# Patient Record
Sex: Male | Born: 1950 | Race: White | Hispanic: No | Marital: Married | Smoking: Former smoker
Health system: Southern US, Community
[De-identification: ages and names within clinical notes are randomized; demographics above are authoritative.]

## PROBLEM LIST (undated history)

## (undated) DIAGNOSIS — E785 Hyperlipidemia, unspecified: Secondary | ICD-10-CM

## (undated) DIAGNOSIS — J302 Other seasonal allergic rhinitis: Secondary | ICD-10-CM

## (undated) DIAGNOSIS — N200 Calculus of kidney: Secondary | ICD-10-CM

## (undated) DIAGNOSIS — Z8639 Personal history of other endocrine, nutritional and metabolic disease: Secondary | ICD-10-CM

## (undated) DIAGNOSIS — I10 Essential (primary) hypertension: Secondary | ICD-10-CM

## (undated) DIAGNOSIS — M48061 Spinal stenosis, lumbar region without neurogenic claudication: Secondary | ICD-10-CM

## (undated) DIAGNOSIS — N3289 Other specified disorders of bladder: Secondary | ICD-10-CM

## (undated) DIAGNOSIS — K5730 Diverticulosis of large intestine without perforation or abscess without bleeding: Secondary | ICD-10-CM

## (undated) DIAGNOSIS — R339 Retention of urine, unspecified: Secondary | ICD-10-CM

## (undated) DIAGNOSIS — Z8551 Personal history of malignant neoplasm of bladder: Secondary | ICD-10-CM

## (undated) DIAGNOSIS — R350 Frequency of micturition: Secondary | ICD-10-CM

## (undated) DIAGNOSIS — C679 Malignant neoplasm of bladder, unspecified: Secondary | ICD-10-CM

## (undated) DIAGNOSIS — R351 Nocturia: Secondary | ICD-10-CM

## (undated) DIAGNOSIS — M1990 Unspecified osteoarthritis, unspecified site: Secondary | ICD-10-CM

## (undated) DIAGNOSIS — Z87442 Personal history of urinary calculi: Secondary | ICD-10-CM

## (undated) DIAGNOSIS — Z8572 Personal history of non-Hodgkin lymphomas: Secondary | ICD-10-CM

## (undated) HISTORY — PX: NASAL SEPTUM SURGERY: SHX37

## (undated) HISTORY — PX: EXTRACORPOREAL SHOCK WAVE LITHOTRIPSY: SHX1557

## (undated) HISTORY — PX: TRANSURETHRAL RESECTION OF BLADDER TUMOR: SHX2575

## (undated) HISTORY — PX: BLADDER DIVERTICULECTOMY: SHX1235

---

## 1999-01-31 ENCOUNTER — Encounter (INDEPENDENT_AMBULATORY_CARE_PROVIDER_SITE_OTHER): Payer: Self-pay | Admitting: Specialist

## 1999-01-31 ENCOUNTER — Ambulatory Visit (HOSPITAL_COMMUNITY): Admission: RE | Admit: 1999-01-31 | Discharge: 1999-01-31 | Payer: Self-pay | Admitting: Gastroenterology

## 2003-05-13 ENCOUNTER — Ambulatory Visit: Admission: RE | Admit: 2003-05-13 | Discharge: 2003-05-13 | Payer: Self-pay | Admitting: Family Medicine

## 2004-11-01 ENCOUNTER — Ambulatory Visit: Payer: Self-pay | Admitting: Family Medicine

## 2005-03-01 ENCOUNTER — Ambulatory Visit: Payer: Self-pay | Admitting: Family Medicine

## 2005-04-18 ENCOUNTER — Ambulatory Visit: Payer: Self-pay | Admitting: Family Medicine

## 2006-01-08 ENCOUNTER — Ambulatory Visit: Payer: Self-pay | Admitting: Family Medicine

## 2006-01-31 ENCOUNTER — Ambulatory Visit: Payer: Self-pay | Admitting: Family Medicine

## 2006-05-07 ENCOUNTER — Ambulatory Visit: Payer: Self-pay | Admitting: Family Medicine

## 2006-06-29 ENCOUNTER — Emergency Department (HOSPITAL_COMMUNITY): Admission: EM | Admit: 2006-06-29 | Discharge: 2006-06-29 | Payer: Self-pay | Admitting: Emergency Medicine

## 2006-06-30 ENCOUNTER — Emergency Department (HOSPITAL_COMMUNITY): Admission: EM | Admit: 2006-06-30 | Discharge: 2006-06-30 | Payer: Self-pay | Admitting: Emergency Medicine

## 2006-07-19 ENCOUNTER — Ambulatory Visit (HOSPITAL_BASED_OUTPATIENT_CLINIC_OR_DEPARTMENT_OTHER): Admission: RE | Admit: 2006-07-19 | Discharge: 2006-07-19 | Payer: Self-pay | Admitting: Urology

## 2006-07-19 HISTORY — PX: OTHER SURGICAL HISTORY: SHX169

## 2006-09-18 ENCOUNTER — Ambulatory Visit: Payer: Self-pay | Admitting: Family Medicine

## 2006-11-29 ENCOUNTER — Ambulatory Visit (HOSPITAL_COMMUNITY): Admission: RE | Admit: 2006-11-29 | Discharge: 2006-11-29 | Payer: Self-pay | Admitting: Urology

## 2010-08-19 NOTE — Op Note (Signed)
NAME:  OMARRI, EICH                ACCOUNT NO.:  [id number]   MEDICAL RECORD NO.:  [id number]          PATIENT TYPE:  AMB   LOCATION:  [facility]                         FACILITY:  [facility]   PHYSICIAN:  Lindaann Slough, M.D.  DATE OF BIRTH:  1950/06/03   DATE OF PROCEDURE:  07/19/2006  DATE OF DISCHARGE:                               OPERATIVE REPORT   PREOPERATIVE DIAGNOSIS:  Right ureteral stone.   POSTOPERATIVE DIAGNOSIS:  Right ureteral stone.   PROCEDURE:  Cystoscopy, right retrograde pyelogram, ureteroscopy,  holmium laser right ureteral stone, and stone extraction.   SURGEON:  Danae Chen, M.D.   ANESTHESIA:  General.   INDICATION:  The patient is a 60 year old male who was seen in the  emergency room on March 28 with severe right flank pain.  CT scan showed  bilateral renal calculi and a 4 mm right UPJ calculus.  The patient has  been having pain on and off.  He has been on Flomax and he has not  passed a stone.  Repeat CT scan a week ago showed that the stone has  moved down to the mid ureter.  [place]  and he is concerned about having pain while he is away and he wanted to  have the stone removed.  Since he has not passed the stone, he is  scheduled today for stone manipulation.   DESCRIPTION OF PROCEDURE:  Under general anesthesia, the patient was  prepped and draped and placed in the dorsal lithotomy position.  A #22  Wappler cystoscope was inserted in the bladder.  The anterior urethra is  normal. He has moderate prostatic hypertrophy. The bladder is moderately  trabeculated.  There is no stone or tumor in the bladder.   Retrograde pyelogram:   A cone tip catheter was then passed through the cystoscope into the  right ureteral orifice.  Contrast was then injected through the cone tip  catheter.  The distal ureter appears normal.  I do not see a filling  defect in the ureter and the proximal ureter is moderately  dilated.  The cone tip  catheter was then removed.  A glidewire was then passed  through an open ended catheter and the open ended catheter was passed  through the cystoscope into the right  ureteral orifice and the  glidewire was advanced all the way up into the renal pelvis.  The open  ended catheter was then advanced over the glidewire into the mid ureter  and the glidewire was replaced with a guide-wire.   The intramural ureter was then dilated with the inner sheath of the  ureteroscope access sheath and the ureteroscope access sheath was  removed.  Then, a 6.5 French semi-rigid ureteroscope was passed in the  bladder and in the ureter without difficulty.  There is a stone in the  distal ureter.  With the 365 microfiber Holmium laser, the stone was  fragmented in multiple stone fragments.  Then, the stone fragments were  removed with a nitinol stone basket and dropped in the bladder.  The  ureteroscope  was then reinserted in the ureter and there was no evidence  of remaining stone fragments in the ureter.  The ureteroscope was then  removed.  The guidewire was then back loaded into the cystoscope and a 6  French 26 double J catheter was passed over the guidewire. The proximal  curl of the double-J  catheter is in the renal pelvis.  The distal curl is in the bladder.  The guidewire was then removed.  The stone fragments were irrigated out  of the bladder.  The double-J catheter was left with the string.   The patient tolerated the procedure well and left the OR in satisfactory  condition to the post anesthesia care unit.      Lindaann Slough, M.D.  Electronically Signed     MN/MEDQ  D:  07/19/2006  T:  07/19/2006  Job:  [id number]

## 2011-01-13 ENCOUNTER — Ambulatory Visit (HOSPITAL_BASED_OUTPATIENT_CLINIC_OR_DEPARTMENT_OTHER)
Admission: RE | Admit: 2011-01-13 | Discharge: 2011-01-13 | Disposition: A | Discharge status: Home / Self Care | Payer: PRIVATE HEALTH INSURANCE | Source: Ambulatory Visit | Attending: Urology | Admitting: Urology

## 2011-01-13 DIAGNOSIS — I10 Essential (primary) hypertension: Secondary | ICD-10-CM | POA: Insufficient documentation

## 2011-01-13 DIAGNOSIS — Z79899 Other long term (current) drug therapy: Secondary | ICD-10-CM | POA: Insufficient documentation

## 2011-01-13 DIAGNOSIS — D494 Neoplasm of unspecified behavior of bladder: Secondary | ICD-10-CM | POA: Insufficient documentation

## 2011-01-13 DIAGNOSIS — N323 Diverticulum of bladder: Secondary | ICD-10-CM | POA: Insufficient documentation

## 2011-01-13 DIAGNOSIS — N400 Enlarged prostate without lower urinary tract symptoms: Secondary | ICD-10-CM | POA: Insufficient documentation

## 2011-01-13 DIAGNOSIS — Z538 Procedure and treatment not carried out for other reasons: Secondary | ICD-10-CM | POA: Insufficient documentation

## 2011-01-13 HISTORY — PX: OTHER SURGICAL HISTORY: SHX169

## 2011-01-13 LAB — POCT I-STAT 4, (NA,K, GLUC, HGB,HCT)
HCT: 43 % (ref 39.0–52.0)
Hemoglobin: 14.6 g/dL (ref 13.0–17.0)

## 2011-01-26 NOTE — Op Note (Signed)
Justin Huber, Justin Huber                ACCOUNT NO.:  [id number]  MEDICAL RECORD NO.:  [id number]  LOCATION:                               FACILITY:  [facility]  PHYSICIAN:  Danae Chen, M.D.  DATE OF BIRTH:  1950-06-08  DATE OF PROCEDURE:  01/13/2011 DATE OF DISCHARGE:                              OPERATIVE REPORT   PREOPERATIVE DIAGNOSIS:  Recurrent bladder tumor.  POSTOP DIAGNOSES:  Recurrent bladder tumor.  PROCEDURE:  Cystoscopy attempted and TUR bladder tumor.  SURGEONS: 1. Danae Chen, M.D. 2. Sigmund I. Patsi Sears, M.D.  ANESTHESIA:  General.  INDICATION:  The patient is a 60 year old male who had a TUR of a bladder tumor in a diverticulum a year ago in [place].  Cystoscopy about 2 weeks ago showed a recurrent tumor in the diverticulum.  The patient has several diverticula on the left side of the bladder.  He is scheduled today for cystoscopy, TUR bladder tumor.  PROCEDURE IN DETAIL:  The patient was identified by his wristband and proper time-out was taken.  Under general anesthesia, he was prepped and draped and placed in the dorsal lithotomy position.  A panendoscope was inserted in the bladder. The anterior urethra was normal.  There was trilobar prostatic hypertrophy.  There were several diverticula on the left side of the bladder.  There was a papillary tumor in one of the diverticula.  The cystoscope was removed.  The urethra was dilated with a #30-French [employer] sounds.  Then, a #28 Iglesias resectoscope was inserted in the bladder.  It was difficult to access the diverticulum with the bladder tumor with the resectoscope.  I removed the resectoscope and passed a flexible cystoscope in the bladder and I was able to visualize the tumor in the diverticulum.  However, it was difficult to use a Bovie electrode to fulgurate the tumor with the flexible scope.  I then removed the flexible cystoscope and reinserted the resectoscope in the bladder, but again it was  difficult to enter the diverticulum with the resectoscope.  I removed the resectoscope and I called Dr. Patsi Sears and asked him for his assistance.  We reinserted a cystoscope with a  right angle lens in the bladder and was able to again access the diverticulum.  Several attempts were made to fulgurate the bladder tumor with the Bovie electrode through the rigid scope, but it was difficult because of the location of the bladder tumor at the 6 o'clock position in the diverticulum, it was difficult to fulgurate it.  After several attempts to fulgurate the bladder tumor in the diverticulum, it was then decided to end the procedure.  There was no evidence of bleeding.  The cystoscope was removed.  The patient tolerated the procedure well and left the OR in satisfactory condition to postanesthesia care unit.  PLAN:  Plan is to either treat the patient with intravesical BCG and repeat the cystoscopy in the office or to take the patient back to the OR and attempt again to fulgurate the bladder tumor and this will be discussed with him and his wife when he returns to the office for followup.     Danae Chen, M.D.  MN/MEDQ  D:  01/13/2011  T:  01/14/2011  Job:  [id number]  Electronically Signed by Lindaann Slough M.D. on 01/26/2011 03:43:33 PM

## 2012-10-19 ENCOUNTER — Emergency Department (HOSPITAL_COMMUNITY)
Admission: EM | Admit: 2012-10-19 | Discharge: 2012-10-19 | Disposition: A | Discharge status: Home / Self Care | Payer: 59 | Attending: Emergency Medicine | Admitting: Emergency Medicine

## 2012-10-19 ENCOUNTER — Emergency Department (HOSPITAL_COMMUNITY): Payer: 59

## 2012-10-19 ENCOUNTER — Encounter (HOSPITAL_COMMUNITY): Payer: Self-pay

## 2012-10-19 DIAGNOSIS — Z8571 Personal history of Hodgkin lymphoma: Secondary | ICD-10-CM | POA: Insufficient documentation

## 2012-10-19 DIAGNOSIS — C8590 Non-Hodgkin lymphoma, unspecified, unspecified site: Secondary | ICD-10-CM | POA: Insufficient documentation

## 2012-10-19 DIAGNOSIS — Z7982 Long term (current) use of aspirin: Secondary | ICD-10-CM | POA: Insufficient documentation

## 2012-10-19 DIAGNOSIS — N323 Diverticulum of bladder: Secondary | ICD-10-CM | POA: Insufficient documentation

## 2012-10-19 DIAGNOSIS — N200 Calculus of kidney: Secondary | ICD-10-CM | POA: Insufficient documentation

## 2012-10-19 DIAGNOSIS — Z79899 Other long term (current) drug therapy: Secondary | ICD-10-CM | POA: Insufficient documentation

## 2012-10-19 DIAGNOSIS — Z87891 Personal history of nicotine dependence: Secondary | ICD-10-CM | POA: Insufficient documentation

## 2012-10-19 DIAGNOSIS — Z9889 Other specified postprocedural states: Secondary | ICD-10-CM | POA: Insufficient documentation

## 2012-10-19 DIAGNOSIS — D303 Benign neoplasm of bladder: Secondary | ICD-10-CM | POA: Insufficient documentation

## 2012-10-19 DIAGNOSIS — Z87448 Personal history of other diseases of urinary system: Secondary | ICD-10-CM | POA: Insufficient documentation

## 2012-10-19 LAB — CBC WITH DIFFERENTIAL/PLATELET
Basophils Relative: 1 % (ref 0–1)
Hemoglobin: 14.8 g/dL (ref 13.0–17.0)
Lymphs Abs: 1.2 10*3/uL (ref 0.7–4.0)
MCHC: 34.6 g/dL (ref 30.0–36.0)
Monocytes Relative: 8 % (ref 3–12)
Neutro Abs: 6.3 10*3/uL (ref 1.7–7.7)
Neutrophils Relative %: 76 % (ref 43–77)
RBC: 4.98 MIL/uL (ref 4.22–5.81)

## 2012-10-19 LAB — BASIC METABOLIC PANEL
BUN: 15 mg/dL (ref 6–23)
Chloride: 102 mEq/L (ref 96–112)
GFR calc Af Amer: >90 mL/min (ref >90)
Potassium: 3.7 mEq/L (ref 3.5–5.1)

## 2012-10-19 MED ORDER — ONDANSETRON HCL 4 MG/2ML IJ SOLN
4.0000 mg | Freq: Once | INTRAMUSCULAR | Status: AC
  Administered 2012-10-19: 4 mg via INTRAVENOUS
  Filled 2012-10-19: qty 2

## 2012-10-19 MED ORDER — ONDANSETRON 4 MG PO TBDP: ORAL_TABLET | ORAL | Status: DC

## 2012-10-19 MED ORDER — KETOROLAC TROMETHAMINE 30 MG/ML IJ SOLN
30.0000 mg | Freq: Once | INTRAMUSCULAR | Status: AC
  Administered 2012-10-19: 30 mg via INTRAVENOUS
  Filled 2012-10-19: qty 1

## 2012-10-19 MED ORDER — OXYCODONE-ACETAMINOPHEN 5-325 MG PO TABS: 1.0000 | ORAL_TABLET | Freq: Four times a day (QID) | ORAL | Status: DC | PRN

## 2012-10-19 MED ORDER — TAMSULOSIN HCL 0.4 MG PO CAPS: 0.4000 mg | ORAL_CAPSULE | Freq: Every day | ORAL | Status: DC

## 2012-10-19 MED ORDER — HYDROMORPHONE HCL PF 1 MG/ML IJ SOLN
1.0000 mg | Freq: Once | INTRAMUSCULAR | Status: AC
  Administered 2012-10-19: 1 mg via INTRAVENOUS
  Filled 2012-10-19: qty 1

## 2012-10-19 NOTE — ED Provider Notes (Signed)
History    CSN: [id number] Arrival date & time 10/19/12  4098  First MD Initiated Contact with Patient 10/19/12 [phone]     Chief Complaint  Patient presents with  . Flank Pain   (Consider location/radiation/quality/duration/timing/severity/associated sxs/prior Treatment) Patient is a 62 y.o. male presenting with flank pain. The history is provided by the patient (pt complains of left flank pain). No language interpreter was used.  Flank Pain This is a new problem. The current episode started 6 to 12 hours ago. The problem occurs constantly. The problem has not changed since onset.Pertinent negatives include no chest pain, no abdominal pain and no headaches. Nothing aggravates the symptoms. Nothing relieves the symptoms.   Past Medical History  Diagnosis Date  . Benign bladder tumor   . Bladder diverticulum   . Kidney stone   . Cancer   . Non Hodgkin's lymphoma    Past Surgical History  Procedure Laterality Date  . Lithotripsy    . Cystoscopy/retrograde/ureteroscopy/stone extraction with basket     History reviewed. No pertinent family history. History  Substance Use Topics  . Smoking status: Never Smoker   . Smokeless tobacco: Not on file  . Alcohol Use: Yes    Review of Systems  Constitutional: Negative for appetite change and fatigue.  HENT: Negative for congestion, sinus pressure and ear discharge.   Eyes: Negative for discharge.  Respiratory: Negative for cough.   Cardiovascular: Negative for chest pain.  Gastrointestinal: Negative for abdominal pain and diarrhea.  Genitourinary: Positive for flank pain. Negative for frequency and hematuria.  Musculoskeletal: Negative for back pain.  Skin: Negative for rash.  Neurological: Negative for seizures and headaches.  Psychiatric/Behavioral: Negative for hallucinations.    Allergies  Review of patient's allergies indicates no known allergies.  Home Medications   Current Outpatient Rx  Name  Route  Sig  Dispense   Refill  . aspirin EC 81 MG tablet   Oral   Take 81 mg by mouth every evening.         Marland Kitchen atorvastatin (LIPITOR) 10 MG tablet   Oral   Take 10 mg by mouth every evening.         . hydrochlorothiazide (HYDRODIURIL) 25 MG tablet   Oral   Take 12.5 mg by mouth every morning.         . irbesartan (AVAPRO) 300 MG tablet   Oral   Take 300 mg by mouth every morning.         . ondansetron (ZOFRAN ODT) 4 MG disintegrating tablet      4mg  ODT q6 hours prn nausea/vomit   12 tablet   0   . oxyCODONE-acetaminophen (PERCOCET/ROXICET) 5-325 MG per tablet   Oral   Take 1 tablet by mouth every 6 (six) hours as needed for pain.   20 tablet   0   . tamsulosin (FLOMAX) 0.4 MG CAPS   Oral   Take 1 capsule (0.4 mg total) by mouth daily.   5 capsule   0    BP 152/73  Pulse 66  Temp(Src) 97.9 F (36.6 C) (Oral)  Resp 20  SpO2 96% Physical Exam  Constitutional: He is oriented to person, place, and time. He appears well-developed.  HENT:  Head: Normocephalic.  Eyes: Conjunctivae and EOM are normal. No scleral icterus.  Neck: Neck supple. No thyromegaly present.  Cardiovascular: Normal rate and regular rhythm.  Exam reveals no gallop and no friction rub.   No murmur heard. Pulmonary/Chest: No stridor. He has  no wheezes. He has no rales. He exhibits no tenderness.  Abdominal: He exhibits no distension. There is no tenderness. There is no rebound.  Genitourinary:  Tender left flank  Musculoskeletal: Normal range of motion. He exhibits no edema.  Lymphadenopathy:    He has no cervical adenopathy.  Neurological: He is oriented to person, place, and time. Coordination normal.  Skin: No rash noted. No erythema.  Psychiatric: He has a normal mood and affect. His behavior is normal.    ED Course  Procedures (including critical care time) Labs Reviewed  BASIC METABOLIC PANEL - Abnormal; Notable for the following:    Glucose, Bld 136 (*)    GFR calc non Af Amer 85 (*)    All  other components within normal limits  CBC WITH DIFFERENTIAL   Ct Abdomen Pelvis Wo Contrast  10/19/2012   *RADIOLOGY REPORT*  Clinical Data: Abdominal pain, dysuria and nausea  CT ABDOMEN AND PELVIS WITHOUT CONTRAST  Technique:  Multidetector CT imaging of the abdomen and pelvis was performed following the standard protocol without intravenous contrast.  Comparison: Most recent CT abdomen/pelvis 01/09/2011  Findings:  Lower Chest:  Lung bases are clear.  Visualized cardiac structures within normal limits for size.  No pericardial effusion. Unremarkable distal thoracic esophagus.  Abdomen: Unenhanced CT was performed per clinician order.  Lack of IV contrast limits sensitivity and specificity, especially for evaluation of abdominal/pelvic solid viscera.  Within these limitations, unremarkable CT appearance of the stomach and duodenum (incidental note made of a periampullary duodenal diverticulum) adrenal glands and pancreas.  Punctate calcifications throughout the spleen consistent with old granulomatous disease and unchanged compared to prior.  Normal hepatic morphology and contours.  No focal hepatic lesion. Gallbladder is unremarkable. No intra or extrahepatic biliary ductal dilatation.  Mild left hydronephrosis, renal edema and asymmetric left greater than right perinephric edema secondary to a 3 x 5 mm stone in the proximal ureter located at the level of the inferior endplate of L2. Additional bilateral nonobstructing nephrolithiasis with at least five stones on the right and an additional two stones on the left.  The largest right-sided stone measures 4 mm.  The largest intra renal left stone measures 4 mm as well. Sub centimeter non the fluid attenuation lesions exophytic from the posterior interpolar and lower pole regions of the left kidney are incompletely evaluated the absence of intravenous contrast material.  The lesions appear slightly enlarged compared to the prior study.  Normal-caliber large and  small bowel throughout the abdomen.  No evidence of a bowel obstruction or focal bowel wall thickening.  Scattered sigmoid diverticulosis without active inflammation.  No free fluid or suspicious adenopathy.  Pelvis: Stable appearance of bilateral bladder diverticula and surgical changes in the bilateral pelvic side walls.  No free fluid or suspicious adenopathy.  Coarse calcifications noted in the prostate gland.  Small bilateral fat containing inguinal hernias.  Bones: No acute fracture or aggressive appearing lytic or blastic osseous lesion. Lower lumbar facet arthropathy.  Vascular: Limited evaluation in the absence of intravenous contrast.  Scattered atherosclerotic vascular calcifications without aneurysmal dilatation.  IMPRESSION:  1.  Obstructing 3 x 5 mm proximal left ureteral stone with resultant mild - moderate left hydronephrosis, renal edema and perinephric stranding.  2. Multiple additional nonobstructing renal calculi bilaterally.  3.  Sub centimeter exophytic lesions from the left kidney are incompletely evaluated in the absence of intravenous contrast material, but appears slightly enlarged compared to the prior study.  These may represent complex (hemorrhagic)  cysts, or potentially small renal neoplasms.  Recommend further evaluation with MRI of the abdomen with and without contrast in 3- 6 months.  4.  Stable appearance of bilateral bladder diverticula.  5.  Atherosclerosis.  6.  Sigmoid colonic diverticulosis without active diverticulitis.  7.  Small bilateral fat containing inguinal hernias.   Original Report Authenticated By: Malachy Moan, M.D.   1. Kidney stone     MDM    Benny Lennert, MD 10/19/12 [phone]

## 2012-10-19 NOTE — ED Notes (Signed)
Urinal at bedside.  

## 2012-10-19 NOTE — ED Notes (Signed)
He c/o left flank pain which began early this morning.

## 2012-10-21 ENCOUNTER — Other Ambulatory Visit: Payer: Self-pay | Admitting: Urology

## 2012-10-22 ENCOUNTER — Encounter (HOSPITAL_COMMUNITY): Payer: Self-pay | Admitting: *Deleted

## 2012-10-24 ENCOUNTER — Ambulatory Visit (HOSPITAL_COMMUNITY): Payer: 59

## 2012-10-24 ENCOUNTER — Encounter (HOSPITAL_COMMUNITY)
Admission: RE | Disposition: A | Discharge status: Home / Self Care | Payer: Self-pay | Source: Ambulatory Visit | Attending: Urology

## 2012-10-24 ENCOUNTER — Ambulatory Visit (HOSPITAL_COMMUNITY)
Admission: RE | Admit: 2012-10-24 | Discharge: 2012-10-24 | Disposition: A | Discharge status: Home / Self Care | Payer: 59 | Source: Ambulatory Visit | Attending: Urology | Admitting: Urology

## 2012-10-24 ENCOUNTER — Encounter (HOSPITAL_COMMUNITY): Payer: Self-pay | Admitting: *Deleted

## 2012-10-24 DIAGNOSIS — Z7982 Long term (current) use of aspirin: Secondary | ICD-10-CM | POA: Insufficient documentation

## 2012-10-24 DIAGNOSIS — Z79899 Other long term (current) drug therapy: Secondary | ICD-10-CM | POA: Insufficient documentation

## 2012-10-24 DIAGNOSIS — N201 Calculus of ureter: Secondary | ICD-10-CM | POA: Insufficient documentation

## 2012-10-24 DIAGNOSIS — E7800 Pure hypercholesterolemia, unspecified: Secondary | ICD-10-CM | POA: Insufficient documentation

## 2012-10-24 DIAGNOSIS — I10 Essential (primary) hypertension: Secondary | ICD-10-CM | POA: Insufficient documentation

## 2012-10-24 DIAGNOSIS — N1330 Unspecified hydronephrosis: Secondary | ICD-10-CM | POA: Insufficient documentation

## 2012-10-24 HISTORY — DX: Essential (primary) hypertension: I10

## 2012-10-24 SURGERY — LITHOTRIPSY, ESWL
Anesthesia: LOCAL | Laterality: Left

## 2012-10-24 MED ORDER — CIPROFLOXACIN HCL 500 MG PO TABS
500.0000 mg | ORAL_TABLET | ORAL | Status: AC
  Administered 2012-10-24: 500 mg via ORAL
  Filled 2012-10-24: qty 1

## 2012-10-24 MED ORDER — DIAZEPAM 5 MG PO TABS
10.0000 mg | ORAL_TABLET | ORAL | Status: AC
  Administered 2012-10-24: 10 mg via ORAL
  Filled 2012-10-24: qty 2

## 2012-10-24 MED ORDER — DIPHENHYDRAMINE HCL 25 MG PO CAPS
25.0000 mg | ORAL_CAPSULE | ORAL | Status: AC
  Administered 2012-10-24: 25 mg via ORAL
  Filled 2012-10-24: qty 1

## 2012-10-24 MED ORDER — DEXTROSE-NACL 5-0.45 % IV SOLN
INTRAVENOUS | Status: DC
  Administered 2012-10-24: 12:00:00 via INTRAVENOUS

## 2012-10-24 NOTE — H&P (Signed)
History of Present Illness  Mr Justin Huber was seen in the ER on 7/19 for sudden onset of severe left flank pain associated with nausea and vomiting.  He has a past history of kidney stone.  CT scan showed about 4-5 stones in the right kidney, two in the left.  The largest one in each side measures 4 mm.  There is also a 5 mm proximal left ureteral calculus with mild to moderate hydronephrosis.  Bladder diverticula are also present.  He was discharged home on Flomax.  He has been having mild left flank pain on and off.  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  He follows-up at [facility] on a regular schedule.   Past Medical History Problems  1. History of  Cancer 199.1 2. History of  Hypercholesterolemia 272.0 3. History of  Hypertension 401.9 4. History of  Nephrolithiasis V13.01  Surgical History Problems  1. History of  Cystoscopy (Diagnostic) 2. History of  Cystoscopy Bladder Tumor 596.9 3. History of  Cystoscopy With Ureteroscopy 4. History of  Neck Surgery 5. History of  Repair Of Forearm 6. History of  Rhinoplasty  Current Meds 1. Aspirin 81 MG Oral Tablet; Therapy: (Recorded:31Oct2011) to 2. Avapro 300 MG Oral Tablet; Therapy: (Recorded:31Oct2011) to 3. Hydrochlorothiazide CAPS; Therapy: (Recorded:31Oct2011) to 4. Lipitor 10 MG Oral Tablet; Therapy: (Recorded:31Oct2011) to 5.1  6.1  7. Tamsulosin HCl 0.4 MG Oral Capsule; Therapy: 19Jul2014 to  1. Amended By: Kalani Baray; 10/21/2012 6:42 PMEST   Allergies Medication  1. No Known Drug Allergies  Family History Problems  1. Family history of  Family Health Status - Mother's Age 86 2. Family history of  Family Health Status Number Of Children 2 SONS 3. Family history of  Nephrolithiasis  Social History Problems  1. Alcohol Use 2-3 per wk 2. Caffeine Use 3 PER DAY 3. Family history of  Death In The Family Father AGE 81 - [place] 4. Marital History - Currently Married 5.  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 Tobacco Use V15.82 LESS THAN 1 PACK PER DAYSMOKED FOR 15 YEARSQUIT 15 YEARS AGO  Review of Systems Genitourinary, constitutional, skin, eye, otolaryngeal, hematologic/lymphatic, cardiovascular, pulmonary, endocrine, musculoskeletal, gastrointestinal, neurological and psychiatric system(s) were reviewed and pertinent findings if present are noted.  Genitourinary: nocturia and weak urinary stream.    Vitals Vital Signs [Data Includes: Last 1 Day]  21Jul2014 11:38AM  Blood Pressure: 171 / 84 Heart Rate: 70 Respiration: 18  Physical Exam Constitutional: Well nourished and well developed . No acute distress.  ENT:. The ears and nose are normal in appearance.  Neck: The appearance of the neck is normal and no neck mass is present.  Pulmonary: No respiratory distress and normal respiratory rhythm and effort.  Cardiovascular: Heart rate and rhythm are normal . No peripheral edema.  Abdomen: The abdomen is soft and nontender. No masses are palpated. No CVA tenderness. No hernias are palpable. No hepatosplenomegaly noted.  Rectal: Rectal exam demonstrates normal sphincter tone, no tenderness and no masses. Estimated prostate size is 2+. The prostate has no nodularity, is not indurated and is not tender. The left seminal vesicle is nonpalpable. The right seminal vesicle is nonpalpable. The perineum is normal on inspection.  Genitourinary: Examination of the penis demonstrates no discharge, no masses, no lesions and a normal meatus. The scrotum is without lesions. The right epididymis is palpably normal and non-tender. The left epididymis is palpably normal and non-tender. The right testis is non-tender and   without masses. The left testis is non-tender and without masses.  Lymphatics: The femoral and inguinal nodes are not enlarged or tender.  Skin: Normal skin turgor, no visible rash and no visible skin lesions.  Neuro/Psych:. Mood and affect are appropriate.     Results/Data Urine [Data Includes: Last 1 Day]   21Jul2014  COLOR YELLOW   APPEARANCE CLEAR   SPECIFIC GRAVITY 1.010   pH 6.0   GLUCOSE NEG mg/dL  BILIRUBIN NEG   KETONE NEG mg/dL  BLOOD LARGE   PROTEIN NEG mg/dL  UROBILINOGEN 0.2 mg/dL  NITRITE NEG   LEUKOCYTE ESTERASE NEG   SQUAMOUS EPITHELIAL/HPF NONE SEEN   WBC NONE SEEN WBC/hpf  RBC 11-20 RBC/hpf  BACTERIA NONE SEEN   CRYSTALS NONE SEEN   CASTS NONE SEEN     I independently reviewed the CT scan and the findings are as noted above.   Plan Health Maintenance (V70.0)  1. UA With REFLEX  Done: 21Jul2014 11:15AM 1  Proximal Ureteral Stone On The Left (592.1)  2. Oxycodone-Acetaminophen 5-325 MG Oral Tablet; TAKE 1 TO 2 TABLETS EVERY 4 HOURS AS  NEEDED FOR PAIN; Therapy: 21Jul2014 to (Last Rx:21Jul2014) 1  3. Tamsulosin HCl 0.4 MG Oral Capsule; TAKE 1 CAPSULE Bedtime; Therapy: 21Jul2014 to (Last  Rx:21Jul2014)  Requested for: 21Jul2014; Edited 1  4. Follow-up Schedule Surgery Office  Follow-up  Done: 21Jul2014 1   1. Amended By: Chivon Lepage; 10/21/2012 6:42 PMEST    Continue Flomax.  Tretament options were reviewed with the patient: ESL versus ureteroscopy.  I told him that ESL is the least invasive of those procedures.  He had ESL in the past and wishes to proceed.  The risks of the procedure include but are not limited to hemorrhage, infection, injury to adjacent organs, steinstrasse, inability to fragment stone.  He understands and is agreeable. He will also need metabolic stone evaluation.   

## 2012-10-24 NOTE — Op Note (Signed)
Refer to [facility] Op Note scanned in the chart 

## 2013-11-02 ENCOUNTER — Emergency Department (HOSPITAL_COMMUNITY)
Admission: EM | Admit: 2013-11-02 | Discharge: 2013-11-02 | Disposition: A | Discharge status: Home / Self Care | Payer: Medicare HMO | Attending: Emergency Medicine | Admitting: Emergency Medicine

## 2013-11-02 ENCOUNTER — Emergency Department (HOSPITAL_COMMUNITY): Payer: Medicare HMO

## 2013-11-02 ENCOUNTER — Encounter (HOSPITAL_COMMUNITY): Payer: Self-pay | Admitting: Emergency Medicine

## 2013-11-02 DIAGNOSIS — Z7982 Long term (current) use of aspirin: Secondary | ICD-10-CM | POA: Diagnosis not present

## 2013-11-02 DIAGNOSIS — E876 Hypokalemia: Secondary | ICD-10-CM | POA: Diagnosis not present

## 2013-11-02 DIAGNOSIS — Z87898 Personal history of other specified conditions: Secondary | ICD-10-CM | POA: Insufficient documentation

## 2013-11-02 DIAGNOSIS — R300 Dysuria: Secondary | ICD-10-CM | POA: Diagnosis not present

## 2013-11-02 DIAGNOSIS — R1909 Other intra-abdominal and pelvic swelling, mass and lump: Secondary | ICD-10-CM | POA: Insufficient documentation

## 2013-11-02 DIAGNOSIS — Z87442 Personal history of urinary calculi: Secondary | ICD-10-CM | POA: Insufficient documentation

## 2013-11-02 DIAGNOSIS — R338 Other retention of urine: Secondary | ICD-10-CM | POA: Diagnosis not present

## 2013-11-02 DIAGNOSIS — R000 Tachycardia, unspecified: Secondary | ICD-10-CM | POA: Diagnosis not present

## 2013-11-02 DIAGNOSIS — Z87891 Personal history of nicotine dependence: Secondary | ICD-10-CM | POA: Diagnosis not present

## 2013-11-02 DIAGNOSIS — I10 Essential (primary) hypertension: Secondary | ICD-10-CM | POA: Diagnosis not present

## 2013-11-02 DIAGNOSIS — N201 Calculus of ureter: Secondary | ICD-10-CM | POA: Diagnosis not present

## 2013-11-02 DIAGNOSIS — R1900 Intra-abdominal and pelvic swelling, mass and lump, unspecified site: Secondary | ICD-10-CM

## 2013-11-02 LAB — URINALYSIS, ROUTINE W REFLEX MICROSCOPIC
BILIRUBIN URINE: NEGATIVE
Glucose, UA: NEGATIVE mg/dL
Ketones, ur: NEGATIVE mg/dL
Leukocytes, UA: NEGATIVE
NITRITE: NEGATIVE
PH: 5.5 (ref 5.0–8.0)
Protein, ur: NEGATIVE mg/dL
SPECIFIC GRAVITY, URINE: 1.017 (ref 1.005–1.030)
UROBILINOGEN UA: 0.2 mg/dL (ref 0.0–1.0)

## 2013-11-02 LAB — BASIC METABOLIC PANEL
ANION GAP: 15 (ref 5–15)
BUN: 12 mg/dL (ref 6–23)
CALCIUM: 10.9 mg/dL — AB (ref 8.4–10.5)
CHLORIDE: 97 meq/L (ref 96–112)
CO2: 24 mEq/L (ref 19–32)
CREATININE: 0.72 mg/dL (ref 0.50–1.35)
Glucose, Bld: 137 mg/dL — ABNORMAL HIGH (ref 70–99)
Potassium: 2.8 mEq/L — CL (ref 3.7–5.3)
Sodium: 136 mEq/L — ABNORMAL LOW (ref 137–147)

## 2013-11-02 LAB — CBC WITH DIFFERENTIAL/PLATELET
BASOS ABS: 0 10*3/uL (ref 0.0–0.1)
BASOS PCT: 0 % (ref 0–1)
EOS ABS: 0 10*3/uL (ref 0.0–0.7)
EOS PCT: 0 % (ref 0–5)
HEMATOCRIT: 39.1 % (ref 39.0–52.0)
HEMOGLOBIN: 13.5 g/dL (ref 13.0–17.0)
Lymphocytes Relative: 2 % — ABNORMAL LOW (ref 12–46)
Lymphs Abs: 0.1 10*3/uL — ABNORMAL LOW (ref 0.7–4.0)
MCH: 29.5 pg (ref 26.0–34.0)
MCHC: 34.5 g/dL (ref 30.0–36.0)
MCV: 85.6 fL (ref 78.0–100.0)
MONO ABS: 0.1 10*3/uL (ref 0.1–1.0)
MONOS PCT: 1 % — AB (ref 3–12)
NEUTROS ABS: 6.2 10*3/uL (ref 1.7–7.7)
Neutrophils Relative %: 97 % — ABNORMAL HIGH (ref 43–77)
Platelets: 197 10*3/uL (ref 150–400)
RBC: 4.57 MIL/uL (ref 4.22–5.81)
RDW: 11.7 % (ref 11.5–15.5)
WBC: 6.4 10*3/uL (ref 4.0–10.5)

## 2013-11-02 LAB — URINE MICROSCOPIC-ADD ON

## 2013-11-02 LAB — I-STAT TROPONIN, ED: TROPONIN I, POC: 0.03 ng/mL (ref 0.00–0.08)

## 2013-11-02 LAB — I-STAT CG4 LACTIC ACID, ED: Lactic Acid, Venous: 1.74 mmol/L (ref 0.5–2.2)

## 2013-11-02 MED ORDER — SODIUM CHLORIDE 0.9 % IV BOLUS (SEPSIS)
1000.0000 mL | Freq: Once | INTRAVENOUS | Status: AC
  Administered 2013-11-02: 1000 mL via INTRAVENOUS

## 2013-11-02 MED ORDER — SODIUM CHLORIDE 0.9 % IV SOLN
1000.0000 mL | Freq: Once | INTRAVENOUS | Status: AC
  Administered 2013-11-02: 1000 mL via INTRAVENOUS

## 2013-11-02 MED ORDER — CEPHALEXIN 500 MG PO CAPS: 500.0000 mg | ORAL_CAPSULE | Freq: Three times a day (TID) | ORAL | Status: DC

## 2013-11-02 MED ORDER — LIDOCAINE HCL 2 % EX GEL
CUTANEOUS | Status: AC
  Filled 2013-11-02: qty 10

## 2013-11-02 MED ORDER — POTASSIUM CHLORIDE CRYS ER 20 MEQ PO TBCR
40.0000 meq | EXTENDED_RELEASE_TABLET | Freq: Once | ORAL | Status: AC
  Administered 2013-11-02: 40 meq via ORAL
  Filled 2013-11-02: qty 2

## 2013-11-02 MED ORDER — POTASSIUM CHLORIDE 10 MEQ/100ML IV SOLN
10.0000 meq | Freq: Once | INTRAVENOUS | Status: AC
Start: 2013-11-02 — End: 2013-11-02
  Administered 2013-11-02: 10 meq via INTRAVENOUS
  Filled 2013-11-02: qty 100

## 2013-11-02 MED ORDER — ACETAMINOPHEN 325 MG PO TABS
650.0000 mg | ORAL_TABLET | Freq: Once | ORAL | Status: AC
  Administered 2013-11-02: 650 mg via ORAL
  Filled 2013-11-02: qty 2

## 2013-11-02 MED ORDER — LIDOCAINE HCL 2 % EX GEL
1.0000 "application " | Freq: Once | CUTANEOUS | Status: AC
  Administered 2013-11-02: 1 via URETHRAL

## 2013-11-02 NOTE — ED Notes (Signed)
Bladder Scan = 522mL PA notified.

## 2013-11-02 NOTE — ED Notes (Addendum)
Patient states he thought he had a UTI, seen at Urgent Care for evaluation, was placed on Cipro prophylactic as symptoms were fitting to UTI. Patient states he was called later and told that he did not have UTI but was told to complete abx and see urology on Monday. Patient states he began having fever/chills over night and is now having difficulty voiding during the night having urgency but unable to void.  Temp at home 101 orally, patient took 1 aleve at 0430. Patient reports last episode of voiding prior to bed last night. Patient also c/o burning sensation with voiding. Patient reports hx of kidney stones.

## 2013-11-02 NOTE — ED Notes (Signed)
Initial Contact - pt A+Ox4, resting on stretcher with family at bedside, pt denies complaints "other than shaking" at this time.  EDNP at bedside currently.  Pt is tremulous, afebrile.  Skin PWD.  MAEI, self repositioning for comfort.  Foley draining pink-tinged urine.  Abd s/nt/nd.  Speaking full/clear sentences.  Awaiting radiology.  NAD.

## 2013-11-02 NOTE — ED Notes (Signed)
MD at bedside. 

## 2013-11-02 NOTE — ED Notes (Signed)
Lab phoned K+ of 2.8 which I relayed to our PA, Elizabeth at this time.

## 2013-11-02 NOTE — ED Provider Notes (Signed)
CSN: [id number]     Arrival date & time 11/02/13  0606 History   First MD Initiated Contact with Patient 11/02/13 [phone]     Chief Complaint  Patient presents with  . Urinary Retention  . Dysuria   HPI Pt is 63 yo white male who presents with abd pressure and mild burning on urination.  His symptoms began 5 days ago.  Initially, he thought he was getting a UTI, which he tends to get due to history of renal calculi, benign bladder tumors and bladder diverticula and is followed by a urologist (Dr. Janice Norrie).  5 days ago, he went to an urgent care but because he had taken OTC pyridium, they were unable to analyze his urine.  A culture was sent at that time and he was empirically treated with Cipro.  Since that time he has not had improvement with the pressure or burning.  Early this morning he woke up with sweating, chills, shaking and temperature of 101 at home.  He was also unable to void since yesterday.  Currently, he rates the  pain/pressure as 5/10.  He denies urgency, hematuria, or testicular or penile pain.  He denies any flank pain or pain similar to previous kidney stones.  He reports some loose stools x 2 weeks but denies bloody or dark stools or any abd cramping related bowel movements.  Past Medical History  Diagnosis Date  . Benign bladder tumor   . Bladder diverticulum   . Kidney stone   . Hypertension   . Cancer   . Non Hodgkin's lymphoma 1980   Past Surgical History  Procedure Laterality Date  . Lithotripsy    . Cystoscopy/retrograde/ureteroscopy/stone extraction with basket    . Bladder surgery  2011    small bladder tumor removed  . Nasal septum surgery     No family history on file. History  Substance Use Topics  . Smoking status: Former Smoker -- 1.00 packs/day for 13 years    Quit date: 10/22/1993  . Smokeless tobacco: Not on file  . Alcohol Use: Yes     Comment: wine occ    Review of Systems  Constitutional: Positive for fever and chills.  HENT: Negative for ear  pain and sore throat.   Respiratory: Negative for cough and shortness of breath.   Cardiovascular: Negative for chest pain.  Gastrointestinal: Positive for diarrhea. Negative for nausea, vomiting, constipation and rectal pain.  Genitourinary: Positive for dysuria and difficulty urinating. Negative for urgency, penile pain and testicular pain.  Neurological: Negative for dizziness and weakness.  All other systems reviewed and are negative.   Allergies  Review of patient's allergies indicates no known allergies.  Home Medications   Prior to Admission medications   Medication Sig Start Date End Date Taking? Authorizing Provider  aspirin EC 81 MG tablet Take 81 mg by mouth every evening.   Yes Historical Provider, MD  atorvastatin (LIPITOR) 10 MG tablet Take 10 mg by mouth every evening.   Yes Historical Provider, MD  hydrochlorothiazide (HYDRODIURIL) 25 MG tablet Take 12.5 mg by mouth every morning.   Yes Historical Provider, MD  irbesartan (AVAPRO) 300 MG tablet Take 300 mg by mouth every morning.   Yes Historical Provider, MD  ondansetron (ZOFRAN-ODT) 4 MG disintegrating tablet Take 4 mg by mouth every 8 (eight) hours as needed for nausea or vomiting.   Yes Historical Provider, MD  oxyCODONE-acetaminophen (PERCOCET/ROXICET) 5-325 MG per tablet Take 1 tablet by mouth every 6 (six) hours as needed  for pain. 10/19/12  Yes Maudry Diego, MD  tamsulosin (FLOMAX) 0.4 MG CAPS Take 1 capsule (0.4 mg total) by mouth daily. 10/19/12  Yes Maudry Diego, MD   BP 167/74  Pulse 123  Temp(Src) 100 F (37.8 C) (Oral)  Resp 18  Ht 6\' 1"  (1.854 m)  Wt 218 lb (98.884 kg)  BMI 28.77 kg/m2  SpO2 94% Physical Exam  Nursing note and vitals reviewed. Constitutional: He is oriented to person, place, and time. He appears well-developed and well-nourished. No distress.  HENT:  Head: Normocephalic and atraumatic.  Eyes: Conjunctivae and EOM are normal. Pupils are equal, round, and reactive to light. No  scleral icterus.  Neck: Normal range of motion. Neck supple.  Cardiovascular: Regular rhythm and normal heart sounds.  Tachycardia present.   Pulmonary/Chest: Effort normal and breath sounds normal. No respiratory distress. He has no wheezes. He has no rales. He exhibits no tenderness.  Abdominal: Soft. Normal appearance and bowel sounds are normal. He exhibits no distension. There is no tenderness. There is no CVA tenderness.    Genitourinary: Penis normal.  Musculoskeletal: Normal range of motion.  Neurological: He is alert and oriented to person, place, and time. He has normal strength. GCS eye subscore is 4. GCS verbal subscore is 5. GCS motor subscore is 6.  Skin: Skin is warm, dry and intact.  Psychiatric: He has a normal mood and affect. His behavior is normal. Judgment and thought content normal.    ED Course  Procedures (including critical care time) Labs Review Labs Reviewed  URINALYSIS, ROUTINE W REFLEX MICROSCOPIC - Abnormal; Notable for the following:    Hgb urine dipstick TRACE (*)    All other components within normal limits  CBC WITH DIFFERENTIAL - Abnormal; Notable for the following:    Neutrophils Relative % 97 (*)    Lymphocytes Relative 2 (*)    Lymphs Abs 0.1 (*)    Monocytes Relative 1 (*)    All other components within normal limits  BASIC METABOLIC PANEL - Abnormal; Notable for the following:    Sodium 136 (*)    Potassium 2.8 (*)    Glucose, Bld 137 (*)    Calcium 10.9 (*)    All other components within normal limits  URINE CULTURE  URINE MICROSCOPIC-ADD ON  I-STAT CG4 LACTIC ACID, ED   6:47 AM Pt currently doesn't want pain or nausea medicine.  7:20 AM On re-eval after foley placement, abd soft and non-tender, but pt reports no significant improvement in pressure.  Reports discomfort as 4/10.    8:26 AM Updated pt on lab results and current plan, including potassium replacement. Pt reports bladder/abd discomfort is almost completely resolved.       9:56 AM Follow-up after near-syncopal event, pt reports feeling close to his baseline, skin is warm and dry.  Still mildly hypotensive, will infuse 2nd liter NS bolus.  EKG done.    11:30 AM Pt's blood pressure has improved and updated on plan to check troponin, and do cxr and abd CT for kidney stones.  Pt reports feeling back to baseline.    1:03 PM Pt updated on current status, waiting on CT results.  Pt continues to feel at baseline and no distress noted.      Imaging Review Ct Abdomen Pelvis Wo Contrast  11/02/2013   CLINICAL DATA:  Fever, urinary retention, dysuria. History of bladder cancer.  EXAM: CT ABDOMEN AND PELVIS WITHOUT CONTRAST  TECHNIQUE: Multidetector CT imaging of the abdomen  and pelvis was performed following the standard protocol without IV contrast.  COMPARISON:  10/19/2012  FINDINGS: Dependent bibasilar atelectasis noted.  Unenhanced liver, gallbladder, adrenal glands, spleen, and pancreas are unremarkable. Small duodenum diverticulum incidentally noted. Bilateral nonobstructing renal calculi are identified. 0.9 cm probable hyperdense left posterior renal cortical cyst identified image 39. There is a 6 mm proximal right ureteral calculus identified image 39. Nonspecific bilateral perinephric stranding is identified. No perinephric fluid collection. No free air or fluid.  Evidence of prior pelvic sidewall dissection. There is an ill-defined soft tissue mass at the left pelvic sidewall with apparent invasion of the bladder wall and adjacent left lateral pelvic sidewall structures measuring 4.9 x 4.3 cm image 81. Adjacent bladder wall thickening and surrounding stranding noted. This is separate from the adjacent sigmoid colon which appears unremarkable. A Foley catheter is in place and the bladder is decompressed. Right-sided decompressed probable bladder diverticulum or less likely lymphangioma/lymphocele is incidentally noted image 86. A probable bladder calculus is noted  dependently image 87 measuring 4 mm.  Colonic diverticuli noted without evidence for diverticulitis. Small retroperitoneal nodes are identified, largest adjacent to the left common iliac chain measuring 5 mm image 69.  Mild lumbar spine disc degenerative change is noted. No lytic or sclerotic osseous lesion is identified.  IMPRESSION: 4.9 cm left pelvic sidewall mass arising from or invading the adjacent bladder (possibly within a previously seen bladder diverticulum), with bladder wall thickening identified and a dependent bladder calculus noted. This is highly suspicious for bladder carcinoma recurrence. Cystitis is felt less likely.  5 mm dominant left common iliac chain lymph node which is suspicious for nodal metastatic disease.  Right lateral bladder wall diverticulum. 6 mm proximal right ureteral calculus without hydroureteronephrosis.  Bilateral nonobstructing renal calculi.  These results were called by telephone at the time of interpretation on 11/02/2013 at 1:04 pm to Dr. Jinny Blossom, [facility] , who verbally acknowledged these results.   Electronically Signed   By: Conchita Paris M.D.   On: 11/02/2013 13:09   Dg Chest 2 View  11/02/2013   CLINICAL DATA:  Fever  EXAM: CHEST  2 VIEW  COMPARISON:  None.  FINDINGS: The heart size and mediastinal contours are within normal limits. Both lungs are clear. The visualized skeletal structures are unremarkable. Minimal biapical pleural thickening.  IMPRESSION: No active cardiopulmonary disease.   Electronically Signed   By: Conchita Paris M.D.   On: 11/02/2013 12:42     EKG Interpretation   Date/Time:  Sunday November 02 2013 09:47:41 EDT Ventricular Rate:  91 PR Interval:  168 QRS Duration: 84 QT Interval:  361 QTC Calculation: 444 R Axis:   51 Text Interpretation:  Sinus rhythm No significant change since last  tracing Confirmed by [place] ([id number]) on 11/02/2013 9:52:22 AM      MDM   Final diagnoses:  None    Pt presented today with lower  abdominal pressure and urinary retention since yesterday.  These symptoms began 5 days with the pressure and some burning with urination.  He was seen at an urgent care that day and started on Cipro. The symptoms did not resolve so he came to the ED.  On arrival in the ED, he was febrile and tachycardic and complained of 5/10 "pressure" over his bladder, although he was not specifically tender in his abd on palpation.  A bladder scan was performed that revealed 530 ml in bladder. A foley was placed, although it did not immediately relieve  the sensation of pressure, eventually, he did report relief of pressure.  He was also given IVF, tylenol and labs and urine was collected.  He was hypokalemic (2.8) and PO and IV potassium replacement was given.  While pt was receiving IV potassium, it began to burn and he had a vaso-vagal episode where he became hypotensive, short of breath and diaphoretic.  His symptoms improved after stopping the potassium and infusing the remainder of the liter of IVF.  An EKG was done. His blood pressure remained lower than his normal and so a 2nd liter bolus was given, a troponin was drawn, a CXR and abd CT scan was done.  The results of these tests were normal except for the CT scan which showed a ureteral stone and a pelvic wall mass. Urology was consulted due to these findings with instructions for him to follow-up in their office this week and prescribe keflex.  He reported feeling back to his normal and his vital signs had improved.  He appears safe to go home and unlikely to experience any emergent or acute process.  He was given return precautions and discharged with his indwelling foley and a leg bag.        Britt Bottom, NP 11/02/13 1544

## 2013-11-02 NOTE — ED Notes (Signed)
Pt ret from radiology at this time, resting on stretcher, denies needs/complaints.  NAD.

## 2013-11-02 NOTE — Discharge Instructions (Signed)
°  You were seen today for your urinary retention and painful urination.  We placed an indwelling catheter to drain urine from your bladder and you will be going home with this catheter.  Be sure to maintain good hygiene to help decrease the chance of infection.  You received IV fluids and labs were done to check for a response to infection in your blood and urine.  These labs are normal.  One blood test checks your electrolytes and one of your electrolytes, potassium, was low.  Medicine was given both by mouth and through your IV to replace this.  The IV potassium caused hand pain that may have caused your feeling sweaty and light-headed, but that has resolved.  An EKG and lab to check your heart was done and that was normal.  A chest x-ray was done and was normal.  An abdominal CT was done and the findings were discussed with you by Dr. Tawnya Crook concerning a stone in your urethra and pelvic wall mass. It is important for you to call tomorrow morning to follow up in Dr. Sammie Bench office.  They are expecting your call.  You have also been given a prescription for a different antibiotic called Keflex.   SEEK IMMEDIATE MEDICAL ATTENTION IF: The pain or pressure does not go away or becomes severe.  A temperature above 101 develops.  Repeated vomiting occurs (multiple episodes).  The pain becomes localized to portions of the abdomen. The right side could possibly be appendicitis. In an adult, the left lower portion of the abdomen could be colitis or diverticulitis.  Blood is being passed in stools or vomit (bright red or black tarry stools).  Return also if you develop chest pain, difficulty breathing, dizziness or fainting, or become confused, poorly responsive.

## 2013-11-02 NOTE — ED Notes (Signed)
Pt to radiology.

## 2013-11-02 NOTE — ED Notes (Signed)
Pt given leg bag and instructions on use, pt prefers to keep large bedside drainage bag at this time.  Pt verbalized and demonstrated understanding, questions answered.

## 2013-11-02 NOTE — ED Provider Notes (Signed)
9:20 AM Called to beside, pt pale, diaphoretic, hypotensive, feeling lightheaded. States his chest feels "weird", but denies CP, palpitations. Mental status nml. He states prior to onset of these symptoms, he had been having severe pain at L wrist due to KCl infusion which has been paused. HR nml, BP improving and pt feeling better with initiation of IVF bolus. Suspect Vasovagal episode. Will monitor, IVF will be given. EKG w/o acute findings.  Cardiopulm and abdominal exam benign. Foley catheter draining yellow/orange urine.    BP slow to normalize.  CXR and CT stone study ordered. Stone study showed pelvid sidewall mass arising from or invading bladder w/ wall thickening concerning for bladder carcinoma as well as suspicious lymph node, and 52mm prox R ureteral calculus w/o hydronephrosis. PA [place] spoke w/ Dr. Junious Silk w/ urology who rec close f/u later this week and initiation of Keflex.  I had discussion w/ pt & wife regarding findings and dispo planning.  As he is currently feeling much better and BP normalized, he would prefer to be dc'd home instead of admitted for obs.  He agrees to return for new or worsening symptoms. His wife is able to stay home with him tomorrow.    1. Acute urinary retention   2. Right ureteral stone   3. Dysuria   4. Hypokalemia   5. Pelvic mass in male      Medical screening examination/treatment/procedure(s) were conducted as a shared visit with non-physician practitioner(s) and myself.  I personally evaluated the patient during the encounter.   EKG Interpretation None        Neta Ehlers, MD 11/02/13 2101

## 2013-11-02 NOTE — ED Provider Notes (Signed)
9:24 AM  Pt seen by Clemens Catholic, NP.  I am working with her to orient her to her new NP position in the ED.  I have been following this patient along with her.  Patient p/w suprapubic pressure and dysuria x 1 week, treated with cipro, acute urinary retention since last night.  550cc in bladder this am.  Foley catheter placed with improvement of symptoms.  Pt did have shaking chills this morning and arrived febrile and tachycardic in ED.  Despite this, he was actually feeling well and declined pain medication, no significant pain or discomfort while in ED.  He experienced an abrupt change just prior to the time documented above in which he suddenly felt diaphoretic, lightheaded, with SOB and palpitations.  His BP was 65 systolic though his HR remained normal.  He was very pale and diaphoretic when I went to see him.  He remained A&O, RRR, CTAB, abdomen soft, nondistended, nontender, no guarding, no rebound, no palpable masses.  Foley in place draining clear orange urine.  He had been having pain in his left hand where the IV potassium was infusing at the time his symptoms began.  I asked the nurse to stop the K and give only NS.  I also went to get Dr Tawnya Crook who came promptly to the bedside to speak with and examine him as well.  Within a few minutes, patient's color and blood pressure improved, symptoms resolved.  EKG verbally requested from nurse.  Pt to remain on monitor with IVF running, will continue to monitor.  Discussed with Dr Tawnya Crook.  Suspect vasovagal phenomenon from pain of IV potassium.  Please see Dr Docherty's note as well for further details.    1:35 PM I spoke with Dr Junious Silk regarding this patient, discussed his presentation and labs, CT with him.  He states pt sounds stable from a urologic perspective, would add keflex, will alert office that patient will be seen in the office this week.  Pt to call office in the morning to set up an appointment.     Please see notes by Glade Lloyd, NP and  Dr Tawnya Crook for full details of this visit.    Results for orders placed during the hospital encounter of 11/02/13  URINALYSIS, ROUTINE W REFLEX MICROSCOPIC      Result Value Ref Range   Color, Urine YELLOW  YELLOW   APPearance CLEAR  CLEAR   Specific Gravity, Urine 1.017  1.005 - 1.030   pH 5.5  5.0 - 8.0   Glucose, UA NEGATIVE  NEGATIVE mg/dL   Hgb urine dipstick TRACE (*) NEGATIVE   Bilirubin Urine NEGATIVE  NEGATIVE   Ketones, ur NEGATIVE  NEGATIVE mg/dL   Protein, ur NEGATIVE  NEGATIVE mg/dL   Urobilinogen, UA 0.2  0.0 - 1.0 mg/dL   Nitrite NEGATIVE  NEGATIVE   Leukocytes, UA NEGATIVE  NEGATIVE  CBC WITH DIFFERENTIAL      Result Value Ref Range   WBC 6.4  4.0 - 10.5 K/uL   RBC 4.57  4.22 - 5.81 MIL/uL   Hemoglobin 13.5  13.0 - 17.0 g/dL   HCT 39.1  39.0 - 52.0 %   MCV 85.6  78.0 - 100.0 fL   MCH 29.5  26.0 - 34.0 pg   MCHC 34.5  30.0 - 36.0 g/dL   RDW 11.7  11.5 - 15.5 %   Platelets 197  150 - 400 K/uL   Neutrophils Relative % 97 (*) 43 - 77 %   Neutro  Abs 6.2  1.7 - 7.7 K/uL   Lymphocytes Relative 2 (*) 12 - 46 %   Lymphs Abs 0.1 (*) 0.7 - 4.0 K/uL   Monocytes Relative 1 (*) 3 - 12 %   Monocytes Absolute 0.1  0.1 - 1.0 K/uL   Eosinophils Relative 0  0 - 5 %   Eosinophils Absolute 0.0  0.0 - 0.7 K/uL   Basophils Relative 0  0 - 1 %   Basophils Absolute 0.0  0.0 - 0.1 K/uL  BASIC METABOLIC PANEL      Result Value Ref Range   Sodium 136 (*) 137 - 147 mEq/L   Potassium 2.8 (*) 3.7 - 5.3 mEq/L   Chloride 97  96 - 112 mEq/L   CO2 24  19 - 32 mEq/L   Glucose, Bld 137 (*) 70 - 99 mg/dL   BUN 12  6 - 23 mg/dL   Creatinine, Ser 0.72  0.50 - 1.35 mg/dL   Calcium 10.9 (*) 8.4 - 10.5 mg/dL   GFR calc non Af Amer >90  >90 mL/min   GFR calc Af Amer >90  >90 mL/min   Anion gap 15  5 - 15  URINE MICROSCOPIC-ADD ON      Result Value Ref Range   Squamous Epithelial / LPF RARE  RARE   WBC, UA 0-2  <3 WBC/hpf   RBC / HPF 0-2  <3 RBC/hpf   Urine-Other MUCOUS PRESENT    I-STAT  CG4 LACTIC ACID, ED      Result Value Ref Range   Lactic Acid, Venous 1.74  0.5 - 2.2 mmol/L  I-STAT TROPOININ, ED      Result Value Ref Range   Troponin i, poc 0.03  0.00 - 0.08 ng/mL   Comment 3            Ct Abdomen Pelvis Wo Contrast  11/02/2013   CLINICAL DATA:  Fever, urinary retention, dysuria. History of bladder cancer.  EXAM: CT ABDOMEN AND PELVIS WITHOUT CONTRAST  TECHNIQUE: Multidetector CT imaging of the abdomen and pelvis was performed following the standard protocol without IV contrast.  COMPARISON:  10/19/2012  FINDINGS: Dependent bibasilar atelectasis noted.  Unenhanced liver, gallbladder, adrenal glands, spleen, and pancreas are unremarkable. Small duodenum diverticulum incidentally noted. Bilateral nonobstructing renal calculi are identified. 0.9 cm probable hyperdense left posterior renal cortical cyst identified image 39. There is a 6 mm proximal right ureteral calculus identified image 39. Nonspecific bilateral perinephric stranding is identified. No perinephric fluid collection. No free air or fluid.  Evidence of prior pelvic sidewall dissection. There is an ill-defined soft tissue mass at the left pelvic sidewall with apparent invasion of the bladder wall and adjacent left lateral pelvic sidewall structures measuring 4.9 x 4.3 cm image 81. Adjacent bladder wall thickening and surrounding stranding noted. This is separate from the adjacent sigmoid colon which appears unremarkable. A Foley catheter is in place and the bladder is decompressed. Right-sided decompressed probable bladder diverticulum or less likely lymphangioma/lymphocele is incidentally noted image 86. A probable bladder calculus is noted dependently image 87 measuring 4 mm.  Colonic diverticuli noted without evidence for diverticulitis. Small retroperitoneal nodes are identified, largest adjacent to the left common iliac chain measuring 5 mm image 69.  Mild lumbar spine disc degenerative change is noted. No lytic or  sclerotic osseous lesion is identified.  IMPRESSION: 4.9 cm left pelvic sidewall mass arising from or invading the adjacent bladder (possibly within a previously seen bladder diverticulum), with bladder wall thickening  identified and a dependent bladder calculus noted. This is highly suspicious for bladder carcinoma recurrence. Cystitis is felt less likely.  5 mm dominant left common iliac chain lymph node which is suspicious for nodal metastatic disease.  Right lateral bladder wall diverticulum. 6 mm proximal right ureteral calculus without hydroureteronephrosis.  Bilateral nonobstructing renal calculi.  These results were called by telephone at the time of interpretation on 11/02/2013 at 1:04 pm to Dr. Jinny Blossom, [facility] , who verbally acknowledged these results.   Electronically Signed   By: Conchita Paris M.D.   On: 11/02/2013 13:09   Dg Chest 2 View  11/02/2013   CLINICAL DATA:  Fever  EXAM: CHEST  2 VIEW  COMPARISON:  None.  FINDINGS: The heart size and mediastinal contours are within normal limits. Both lungs are clear. The visualized skeletal structures are unremarkable. Minimal biapical pleural thickening.  IMPRESSION: No active cardiopulmonary disease.   Electronically Signed   By: Conchita Paris M.D.   On: 11/02/2013 12:42      Clayton Bibles, PA-C 11/02/13 1503

## 2013-11-03 ENCOUNTER — Encounter (HOSPITAL_COMMUNITY): Payer: Self-pay | Admitting: Emergency Medicine

## 2013-11-03 ENCOUNTER — Encounter (HOSPITAL_COMMUNITY)
Admission: EM | Disposition: A | Discharge status: Home / Self Care | Payer: Self-pay | Source: Home / Self Care | Attending: Family Medicine

## 2013-11-03 ENCOUNTER — Inpatient Hospital Stay (HOSPITAL_COMMUNITY)
Admission: EM | Admit: 2013-11-03 | Discharge: 2013-11-09 | DRG: 871 | Disposition: A | Discharge status: Home / Self Care | Payer: Medicare HMO | Attending: Family Medicine | Admitting: Family Medicine

## 2013-11-03 ENCOUNTER — Encounter (HOSPITAL_COMMUNITY): Payer: Medicare HMO | Admitting: Anesthesiology

## 2013-11-03 ENCOUNTER — Other Ambulatory Visit: Payer: Self-pay | Admitting: Urology

## 2013-11-03 ENCOUNTER — Observation Stay (HOSPITAL_COMMUNITY): Payer: Medicare HMO | Admitting: Anesthesiology

## 2013-11-03 DIAGNOSIS — A419 Sepsis, unspecified organism: Secondary | ICD-10-CM | POA: Diagnosis present

## 2013-11-03 DIAGNOSIS — I10 Essential (primary) hypertension: Secondary | ICD-10-CM | POA: Diagnosis present

## 2013-11-03 DIAGNOSIS — R6520 Severe sepsis without septic shock: Secondary | ICD-10-CM | POA: Diagnosis present

## 2013-11-03 DIAGNOSIS — R000 Tachycardia, unspecified: Secondary | ICD-10-CM

## 2013-11-03 DIAGNOSIS — I9589 Other hypotension: Secondary | ICD-10-CM

## 2013-11-03 DIAGNOSIS — K651 Peritoneal abscess: Secondary | ICD-10-CM | POA: Diagnosis present

## 2013-11-03 DIAGNOSIS — Z79899 Other long term (current) drug therapy: Secondary | ICD-10-CM | POA: Diagnosis not present

## 2013-11-03 DIAGNOSIS — R7309 Other abnormal glucose: Secondary | ICD-10-CM | POA: Diagnosis present

## 2013-11-03 DIAGNOSIS — N210 Calculus in bladder: Secondary | ICD-10-CM | POA: Diagnosis present

## 2013-11-03 DIAGNOSIS — N200 Calculus of kidney: Secondary | ICD-10-CM | POA: Diagnosis present

## 2013-11-03 DIAGNOSIS — Z7982 Long term (current) use of aspirin: Secondary | ICD-10-CM

## 2013-11-03 DIAGNOSIS — E785 Hyperlipidemia, unspecified: Secondary | ICD-10-CM | POA: Diagnosis present

## 2013-11-03 DIAGNOSIS — E860 Dehydration: Secondary | ICD-10-CM | POA: Diagnosis present

## 2013-11-03 DIAGNOSIS — Z906 Acquired absence of other parts of urinary tract: Secondary | ICD-10-CM | POA: Diagnosis not present

## 2013-11-03 DIAGNOSIS — D649 Anemia, unspecified: Secondary | ICD-10-CM

## 2013-11-03 DIAGNOSIS — N323 Diverticulum of bladder: Secondary | ICD-10-CM

## 2013-11-03 DIAGNOSIS — R310 Gross hematuria: Secondary | ICD-10-CM | POA: Diagnosis present

## 2013-11-03 DIAGNOSIS — N3289 Other specified disorders of bladder: Secondary | ICD-10-CM

## 2013-11-03 DIAGNOSIS — R6889 Other general symptoms and signs: Secondary | ICD-10-CM

## 2013-11-03 DIAGNOSIS — Z87891 Personal history of nicotine dependence: Secondary | ICD-10-CM | POA: Diagnosis not present

## 2013-11-03 DIAGNOSIS — R339 Retention of urine, unspecified: Secondary | ICD-10-CM | POA: Diagnosis present

## 2013-11-03 DIAGNOSIS — N1330 Unspecified hydronephrosis: Secondary | ICD-10-CM | POA: Diagnosis present

## 2013-11-03 DIAGNOSIS — I959 Hypotension, unspecified: Secondary | ICD-10-CM | POA: Diagnosis present

## 2013-11-03 DIAGNOSIS — R188 Other ascites: Secondary | ICD-10-CM

## 2013-11-03 DIAGNOSIS — E876 Hypokalemia: Secondary | ICD-10-CM

## 2013-11-03 DIAGNOSIS — R1900 Intra-abdominal and pelvic swelling, mass and lump, unspecified site: Secondary | ICD-10-CM

## 2013-11-03 DIAGNOSIS — R509 Fever, unspecified: Secondary | ICD-10-CM

## 2013-11-03 DIAGNOSIS — Z87898 Personal history of other specified conditions: Secondary | ICD-10-CM

## 2013-11-03 DIAGNOSIS — N201 Calculus of ureter: Secondary | ICD-10-CM | POA: Diagnosis present

## 2013-11-03 DIAGNOSIS — D696 Thrombocytopenia, unspecified: Secondary | ICD-10-CM | POA: Diagnosis present

## 2013-11-03 DIAGNOSIS — D303 Benign neoplasm of bladder: Secondary | ICD-10-CM

## 2013-11-03 DIAGNOSIS — C8590 Non-Hodgkin lymphoma, unspecified, unspecified site: Secondary | ICD-10-CM

## 2013-11-03 HISTORY — DX: Hyperlipidemia, unspecified: E78.5

## 2013-11-03 HISTORY — PX: CYSTOSCOPY WITH RETROGRADE PYELOGRAM, URETEROSCOPY AND STENT PLACEMENT: SHX5789

## 2013-11-03 LAB — URINALYSIS, ROUTINE W REFLEX MICROSCOPIC
Glucose, UA: NEGATIVE mg/dL
KETONES UR: 15 mg/dL — AB
NITRITE: NEGATIVE
PH: 6 (ref 5.0–8.0)
PROTEIN: 100 mg/dL — AB
Specific Gravity, Urine: 1.03 (ref 1.005–1.030)
Urobilinogen, UA: 1 mg/dL (ref 0.0–1.0)

## 2013-11-03 LAB — CBC WITH DIFFERENTIAL/PLATELET
BAND NEUTROPHILS: 0 % (ref 0–10)
BLASTS: 0 %
Basophils Absolute: 0 10*3/uL (ref 0.0–0.1)
Basophils Relative: 0 % (ref 0–1)
Eosinophils Absolute: 0 10*3/uL (ref 0.0–0.7)
Eosinophils Relative: 0 % (ref 0–5)
HEMATOCRIT: 38.7 % — AB (ref 39.0–52.0)
Hemoglobin: 13.3 g/dL (ref 13.0–17.0)
Lymphocytes Relative: 1 % — ABNORMAL LOW (ref 12–46)
Lymphs Abs: 0.1 10*3/uL — ABNORMAL LOW (ref 0.7–4.0)
MCH: 29 pg (ref 26.0–34.0)
MCHC: 34.4 g/dL (ref 30.0–36.0)
MCV: 84.5 fL (ref 78.0–100.0)
METAMYELOCYTES PCT: 0 %
MONOS PCT: 1 % — AB (ref 3–12)
MYELOCYTES: 0 %
Monocytes Absolute: 0.1 10*3/uL (ref 0.1–1.0)
Neutro Abs: 9.4 10*3/uL — ABNORMAL HIGH (ref 1.7–7.7)
Neutrophils Relative %: 98 % — ABNORMAL HIGH (ref 43–77)
PLATELETS: 143 10*3/uL — AB (ref 150–400)
Promyelocytes Absolute: 0 %
RBC: 4.58 MIL/uL (ref 4.22–5.81)
RDW: 11.9 % (ref 11.5–15.5)
WBC: 9.6 10*3/uL (ref 4.0–10.5)
nRBC: 0 /100 WBC

## 2013-11-03 LAB — HEPATIC FUNCTION PANEL
ALBUMIN: 2.6 g/dL — AB (ref 3.5–5.2)
ALT: 44 U/L (ref 0–53)
AST: 65 U/L — ABNORMAL HIGH (ref 0–37)
Alkaline Phosphatase: 148 U/L — ABNORMAL HIGH (ref 39–117)
Bilirubin, Direct: 0.9 mg/dL — ABNORMAL HIGH (ref 0.0–0.3)
Indirect Bilirubin: 0.8 mg/dL (ref 0.3–0.9)
Total Bilirubin: 1.7 mg/dL — ABNORMAL HIGH (ref 0.3–1.2)
Total Protein: 6.5 g/dL (ref 6.0–8.3)

## 2013-11-03 LAB — URINE CULTURE
COLONY COUNT: NO GROWTH
CULTURE: NO GROWTH

## 2013-11-03 LAB — BASIC METABOLIC PANEL
ANION GAP: 17 — AB (ref 5–15)
BUN: 20 mg/dL (ref 6–23)
CO2: 19 meq/L (ref 19–32)
Calcium: 10.5 mg/dL (ref 8.4–10.5)
Chloride: 103 mEq/L (ref 96–112)
Creatinine, Ser: 0.82 mg/dL (ref 0.50–1.35)
GFR calc Af Amer: >90 mL/min (ref >90)
Glucose, Bld: 106 mg/dL — ABNORMAL HIGH (ref 70–99)
POTASSIUM: 3.5 meq/L — AB (ref 3.7–5.3)
SODIUM: 139 meq/L (ref 137–147)

## 2013-11-03 LAB — URINE MICROSCOPIC-ADD ON

## 2013-11-03 SURGERY — CYSTOURETEROSCOPY, WITH RETROGRADE PYELOGRAM AND STENT INSERTION
Anesthesia: General | Site: Ureter | Laterality: Right

## 2013-11-03 MED ORDER — ONDANSETRON HCL 4 MG PO TABS: 4.0000 mg | ORAL_TABLET | Freq: Four times a day (QID) | ORAL | Status: DC | PRN

## 2013-11-03 MED ORDER — DEXTROSE 5 % IV SOLN
1.0000 g | Freq: Once | INTRAVENOUS | Status: AC
  Administered 2013-11-03: 1 g via INTRAVENOUS
  Filled 2013-11-03: qty 10

## 2013-11-03 MED ORDER — ONDANSETRON HCL 4 MG/2ML IJ SOLN: 4.0000 mg | Freq: Three times a day (TID) | INTRAMUSCULAR | Status: DC | PRN

## 2013-11-03 MED ORDER — ONDANSETRON HCL 4 MG/2ML IJ SOLN
INTRAMUSCULAR | Status: AC
  Filled 2013-11-03: qty 2

## 2013-11-03 MED ORDER — LACTATED RINGERS IV SOLN
INTRAVENOUS | Status: DC | PRN
  Administered 2013-11-03: 17:00:00 via INTRAVENOUS

## 2013-11-03 MED ORDER — HYDROMORPHONE HCL PF 1 MG/ML IJ SOLN: 0.2500 mg | INTRAMUSCULAR | Status: DC | PRN

## 2013-11-03 MED ORDER — ENOXAPARIN SODIUM 40 MG/0.4ML ~~LOC~~ SOLN
40.0000 mg | SUBCUTANEOUS | Status: DC
  Administered 2013-11-03: 40 mg via SUBCUTANEOUS
  Filled 2013-11-03 (×2): qty 0.4

## 2013-11-03 MED ORDER — OXYCODONE HCL 5 MG PO TABS: 5.0000 mg | ORAL_TABLET | Freq: Once | ORAL | Status: DC | PRN

## 2013-11-03 MED ORDER — IRBESARTAN 150 MG PO TABS
150.0000 mg | ORAL_TABLET | Freq: Every day | ORAL | Status: DC
  Administered 2013-11-04: 150 mg via ORAL
  Filled 2013-11-03: qty 1

## 2013-11-03 MED ORDER — ONDANSETRON HCL 4 MG/2ML IJ SOLN: 4.0000 mg | Freq: Four times a day (QID) | INTRAMUSCULAR | Status: DC | PRN

## 2013-11-03 MED ORDER — POTASSIUM CHLORIDE IN NACL 20-0.9 MEQ/L-% IV SOLN
INTRAVENOUS | Status: DC
  Administered 2013-11-03 – 2013-11-04 (×3): via INTRAVENOUS
  Filled 2013-11-03 (×5): qty 1000

## 2013-11-03 MED ORDER — LEVOFLOXACIN IN D5W 500 MG/100ML IV SOLN
INTRAVENOUS | Status: DC | PRN
  Administered 2013-11-03: 500 mg via INTRAVENOUS

## 2013-11-03 MED ORDER — FENTANYL CITRATE 0.05 MG/ML IJ SOLN
INTRAMUSCULAR | Status: AC
  Filled 2013-11-03: qty 2

## 2013-11-03 MED ORDER — PROPOFOL 10 MG/ML IV BOLUS
INTRAVENOUS | Status: DC | PRN
  Administered 2013-11-03: 200 mg via INTRAVENOUS

## 2013-11-03 MED ORDER — PROMETHAZINE HCL 25 MG/ML IJ SOLN: 6.2500 mg | INTRAMUSCULAR | Status: DC | PRN

## 2013-11-03 MED ORDER — POLYETHYLENE GLYCOL 3350 17 G PO PACK
17.0000 g | PACK | Freq: Every day | ORAL | Status: DC | PRN
  Filled 2013-11-03: qty 1

## 2013-11-03 MED ORDER — ASPIRIN EC 81 MG PO TBEC
81.0000 mg | DELAYED_RELEASE_TABLET | Freq: Every evening | ORAL | Status: DC
  Administered 2013-11-03: 81 mg via ORAL
  Filled 2013-11-03 (×2): qty 1

## 2013-11-03 MED ORDER — HYDROMORPHONE HCL PF 1 MG/ML IJ SOLN: 1.0000 mg | INTRAMUSCULAR | Status: DC | PRN

## 2013-11-03 MED ORDER — DEXAMETHASONE SODIUM PHOSPHATE 10 MG/ML IJ SOLN
INTRAMUSCULAR | Status: DC | PRN
  Administered 2013-11-03: 10 mg via INTRAVENOUS

## 2013-11-03 MED ORDER — MORPHINE SULFATE 4 MG/ML IJ SOLN: 4.0000 mg | INTRAMUSCULAR | Status: DC | PRN

## 2013-11-03 MED ORDER — DEXAMETHASONE SODIUM PHOSPHATE 10 MG/ML IJ SOLN
INTRAMUSCULAR | Status: AC
  Filled 2013-11-03: qty 1

## 2013-11-03 MED ORDER — MIDAZOLAM HCL 5 MG/5ML IJ SOLN
INTRAMUSCULAR | Status: DC | PRN
  Administered 2013-11-03: 2 mg via INTRAVENOUS

## 2013-11-03 MED ORDER — SUCCINYLCHOLINE CHLORIDE 20 MG/ML IJ SOLN
INTRAMUSCULAR | Status: DC | PRN
  Administered 2013-11-03: 100 mg via INTRAVENOUS

## 2013-11-03 MED ORDER — LIDOCAINE HCL (CARDIAC) 20 MG/ML IV SOLN
INTRAVENOUS | Status: AC
  Filled 2013-11-03: qty 5

## 2013-11-03 MED ORDER — ACETAMINOPHEN 650 MG RE SUPP: 650.0000 mg | Freq: Four times a day (QID) | RECTAL | Status: DC | PRN

## 2013-11-03 MED ORDER — ONDANSETRON HCL 4 MG/2ML IJ SOLN
INTRAMUSCULAR | Status: DC | PRN
  Administered 2013-11-03: 4 mg via INTRAVENOUS

## 2013-11-03 MED ORDER — DEXTROSE 5 % IV SOLN
1.0000 g | INTRAVENOUS | Status: DC
  Administered 2013-11-03 – 2013-11-07 (×5): 1 g via INTRAVENOUS
  Filled 2013-11-03 (×6): qty 10

## 2013-11-03 MED ORDER — OXYCODONE HCL 5 MG/5ML PO SOLN
5.0000 mg | Freq: Once | ORAL | Status: DC | PRN
  Filled 2013-11-03: qty 5

## 2013-11-03 MED ORDER — BELLADONNA ALKALOIDS-OPIUM 16.2-60 MG RE SUPP
RECTAL | Status: AC
  Filled 2013-11-03: qty 1

## 2013-11-03 MED ORDER — LEVOFLOXACIN IN D5W 500 MG/100ML IV SOLN
500.0000 mg | INTRAVENOUS | Status: AC
  Administered 2013-11-03: 500 mg via INTRAVENOUS
  Filled 2013-11-03 (×2): qty 100

## 2013-11-03 MED ORDER — LIDOCAINE HCL (CARDIAC) 20 MG/ML IV SOLN
INTRAVENOUS | Status: DC | PRN
  Administered 2013-11-03: 100 mg via INTRAVENOUS

## 2013-11-03 MED ORDER — PROPOFOL 10 MG/ML IV BOLUS
INTRAVENOUS | Status: AC
  Filled 2013-11-03: qty 20

## 2013-11-03 MED ORDER — ATORVASTATIN CALCIUM 10 MG PO TABS
10.0000 mg | ORAL_TABLET | Freq: Every evening | ORAL | Status: DC
  Administered 2013-11-03 – 2013-11-08 (×6): 10 mg via ORAL
  Filled 2013-11-03 (×7): qty 1

## 2013-11-03 MED ORDER — IOHEXOL 350 MG/ML SOLN
INTRAVENOUS | Status: DC | PRN
  Administered 2013-11-03: 5 mL via INTRAVENOUS

## 2013-11-03 MED ORDER — MEPERIDINE HCL 50 MG/ML IJ SOLN: 6.2500 mg | INTRAMUSCULAR | Status: DC | PRN

## 2013-11-03 MED ORDER — SODIUM CHLORIDE 0.9 % IR SOLN
Status: DC | PRN
  Administered 2013-11-03: 1000 mL via INTRAVESICAL

## 2013-11-03 MED ORDER — SODIUM CHLORIDE 0.9 % IV BOLUS (SEPSIS)
1000.0000 mL | Freq: Once | INTRAVENOUS | Status: AC
  Administered 2013-11-03: 1000 mL via INTRAVENOUS

## 2013-11-03 MED ORDER — BELLADONNA ALKALOIDS-OPIUM 16.2-60 MG RE SUPP
RECTAL | Status: DC | PRN
  Administered 2013-11-03: 1 via RECTAL

## 2013-11-03 MED ORDER — PHENYLEPHRINE HCL 10 MG/ML IJ SOLN
INTRAMUSCULAR | Status: DC | PRN
  Administered 2013-11-03: 40 ug via INTRAVENOUS

## 2013-11-03 MED ORDER — SODIUM CHLORIDE 0.9 % IV SOLN: INTRAVENOUS | Status: DC

## 2013-11-03 MED ORDER — ACETAMINOPHEN 325 MG PO TABS
650.0000 mg | ORAL_TABLET | Freq: Four times a day (QID) | ORAL | Status: DC | PRN
  Administered 2013-11-06 – 2013-11-09 (×3): 650 mg via ORAL
  Filled 2013-11-03 (×3): qty 2

## 2013-11-03 MED ORDER — MIDAZOLAM HCL 2 MG/2ML IJ SOLN
INTRAMUSCULAR | Status: AC
  Filled 2013-11-03: qty 2

## 2013-11-03 MED ORDER — FENTANYL CITRATE 0.05 MG/ML IJ SOLN
INTRAMUSCULAR | Status: DC | PRN
  Administered 2013-11-03 (×2): 100 ug via INTRAVENOUS

## 2013-11-03 MED ORDER — OXYCODONE HCL 5 MG PO TABS: 5.0000 mg | ORAL_TABLET | ORAL | Status: DC | PRN

## 2013-11-03 MED ORDER — ALUM & MAG HYDROXIDE-SIMETH 200-200-20 MG/5ML PO SUSP: 30.0000 mL | Freq: Four times a day (QID) | ORAL | Status: DC | PRN

## 2013-11-03 SURGICAL SUPPLY — 23 items
BAG URINE DRAINAGE (UROLOGICAL SUPPLIES) IMPLANT
BAG URO CATCHER STRL LF (DRAPE) ×4 IMPLANT
BRUSH URET BIOPSY 3F (UROLOGICAL SUPPLIES) IMPLANT
CATH INTERMIT  6FR 70CM (CATHETERS) ×3 IMPLANT
CLOTH BEACON ORANGE TIMEOUT ST (SAFETY) ×4 IMPLANT
DRAPE CAMERA CLOSED 9X96 (DRAPES) ×4 IMPLANT
ELECT BUTTON HF 24-28F 2 30DE (ELECTRODE) IMPLANT
ELECT LOOP MED HF 24F 12D (CUTTING LOOP) ×1 IMPLANT
ELECT LOOP MED HF 24F 12D CBL (CLIP) IMPLANT
ELECT REM PT RETURN 9FT ADLT (ELECTROSURGICAL)
ELECT RESECT VAPORIZE 12D CBL (ELECTRODE) IMPLANT
ELECTRODE REM PT RTRN 9FT ADLT (ELECTROSURGICAL) ×1 IMPLANT
GLOVE BIOGEL M STRL SZ7.5 (GLOVE) ×4 IMPLANT
GOWN STRL REUS W/TWL XL LVL3 (GOWN DISPOSABLE) ×7 IMPLANT
GUIDEWIRE STR DUAL SENSOR (WIRE) ×3 IMPLANT
IV NS 1000ML (IV SOLUTION) ×4
IV NS 1000ML BAXH (IV SOLUTION) ×1 IMPLANT
IV NS IRRIG 3000ML ARTHROMATIC (IV SOLUTION) ×5 IMPLANT
MANIFOLD NEPTUNE II (INSTRUMENTS) ×4 IMPLANT
PACK CYSTO (CUSTOM PROCEDURE TRAY) ×8 IMPLANT
STENT CONTOUR 6FRX28X.038 (STENTS) ×3 IMPLANT
TUBING CONNECTING 10 (TUBING) ×3 IMPLANT
TUBING CONNECTING 10' (TUBING) ×1

## 2013-11-03 NOTE — H&P (Signed)
History and Physical:    Justin Huber LDJ:[id number] DOB: 07/07/1950 DOA: 11/03/2013  Referring physician: Dr. Tawnya Crook PCP: Sherrie Mustache, MD  Urolgist; Dr. Janice Norrie, Dr. Dimas Millin at [facility]  Chief Complaint: Fever, rigors  History of Present Illness:   Justin Huber is an 63 y.o. 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  He was first evaluated for hematuria while traveling in [place], underwent a cystoscopy and resection of a bladder mass while there, but pathology was benign.  He subsequently followed up with Dr. Janice Norrie with quarterly cystoscopies to evaluate for tumor recurrence.  About a year later, Dr. Janice Norrie found a recurrence in a small diverticula, underwent a laparoscopic surgery, but the area could not be found, so he was referred to [facility] for diverticulectomy (had 4 taken out), and the pathology was benign on those as well.   About a week ago, the patient developed dysuria and went to an [facility] where he was placed on a seven-day course of Cipro. Cultures were ultimately negative, and despite treatment with antibiotics, the patient developed fever and rigors 2 days ago. He presented to the ER 11/02/13, and was found to have urinary retention after a bladder scan revealed 560 cc of urine in his bladder. He was discharged home with a Foley catheter. A CT scan done at the time showed a 5 mm bright UPJ stone and a 5 cm mass invading the bladder wall on the left. He was advised to followup with Dr. Janice Norrie.  When he called urology office, he was told Dr. Janice Norrie was on vacation, and advised to come back to the ED where urology has been formally consulted.    ROS:   Constitutional: +fever, + chills;  Appetite diminished; No weight loss, no weight gain, +  fatigue, + diaphoresis.  HEENT: No blurry vision, no diplopia, no pharyngitis, no dysphagia, + dry mouth CV: No chest pain, + palpitations, no PND, no orthopnea, no edema.  Resp: + SOB, no cough, no pleuritic pain. GI: No nausea, no vomiting, + occasional diarrhea, no melena, no hematochezia, no constipation, no abdominal pain.  GU: + dysuria, + hematuria, no frequency, no urgency, +urinary retention. MSK: no myalgias, no arthralgias.  Neuro:  No headache, no focal neurological deficits, no history of seizures, +lightheadedness.  Psych: No depression, no anxiety.  Endo: No heat intolerance, no cold intolerance, no polyuria, no polydipsia  Skin: No rashes, no skin lesions.  Heme: No easy bruising.  Travel history: No recent travel.   Past Medical History:   Past Medical History  Diagnosis Date  . Benign bladder tumor   . Bladder diverticulum   . Kidney stone   . Hypertension   . Cancer   . Non Hodgkin's lymphoma 1980  . Hyperlipidemia     Past Surgical History:   Past Surgical History  Procedure Laterality Date  . Lithotripsy    . Cystoscopy/retrograde/ureteroscopy/stone extraction with basket    . Bladder surgery  2011    small bladder tumor removed  . Nasal septum surgery      Social History:   History   Social History  . Marital Status: Married    Spouse Name: N/A    Number of Children: 2  . Years of Education: N/A  Occupational History  . Retired [employer] History Main Topics  . Smoking status: Former Smoker -- 1.00 packs/day for 13 years    Quit date: 10/22/1993  . Smokeless tobacco: Never Used  . Alcohol Use: Yes     Comment: wine occ  . Drug Use: No  . Sexual Activity: No   Other Topics Concern  . Not on file   Social History Narrative   Married.  Independent of ADLs.  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    Family history:   Family History  Problem Relation Age of Onset  . Dementia Father     Allergies   Review of patient's  allergies indicates no known allergies.  Current Medications:   Prior to Admission medications   Medication Sig Start Date End Date Taking? Authorizing Provider  aspirin EC 81 MG tablet Take 81 mg by mouth every evening.   Yes Historical Provider, MD  atorvastatin (LIPITOR) 10 MG tablet Take 10 mg by mouth every evening.   Yes Historical Provider, MD  cephALEXin (KEFLEX) 500 MG capsule Take 1 capsule (500 mg total) by mouth 3 (three) times daily. 11/02/13  Yes Britt Bottom, NP  hydrochlorothiazide (HYDRODIURIL) 25 MG tablet Take 12.5 mg by mouth every morning.   Yes Historical Provider, MD  irbesartan (AVAPRO) 150 MG tablet Take 150 mg by mouth daily.   Yes Historical Provider, MD  meloxicam (MOBIC) 7.5 MG tablet Take 1 tablet by mouth 2 (two) times daily. 10/06/13  Yes Historical Provider, MD    Physical Exam:   Filed Vitals:   11/03/13 0902 11/03/13 0948 11/03/13 1044 11/03/13 1302  BP: 147/65 127/50  112/56  Pulse: 121 114  95  Temp: 98.5 F (36.9 C)  101.3 F (38.5 C) 99 F (37.2 C)  TempSrc: Oral  Rectal Oral  Resp: 18 22  16   SpO2: 96% 95%  94%     Physical Exam: Blood pressure 112/56, pulse 95, temperature 99 F (37.2 C), temperature source Oral, resp. rate 16, SpO2 94.00%. Gen: No acute distress. Head: Normocephalic, atraumatic. Eyes: PERRL, EOMI, sclerae nonicteric. Mouth: Oropharynx reveals dry mucous membranes good dentition. Neck: Supple, no thyromegaly, no lymphadenopathy, no jugular venous distention. Chest: Lungs clear to auscultation bilaterally with good air movement. CV: Heart sounds are tachycardic without murmurs, rubs, or gallops. Abdomen: Soft, nontender, nondistended with normal active bowel sounds. Rectal: Deferred, done by urologist. Extremities: Extremities are without clubbing, edema, or cyanosis. Skin: Warm and dry. Lymph: No cervical, supraclavicular or axillary lymph nodes palpable. Neuro: Alert and oriented times 3; cranial nerves II  through XII grossly intact. Psych: Mood and affect normal.   Data Review:    Labs: Basic Metabolic Panel:  Recent Labs Lab 11/02/13 0721 11/03/13 1039  NA 136* 139  K 2.8* 3.5*  CL 97 103  CO2 24 19  GLUCOSE 137* 106*  BUN 12 20  CREATININE 0.72 0.82  CALCIUM 10.9* 10.5   CBC:  Recent Labs Lab 11/02/13 0721 11/03/13 1039  WBC 6.4 9.6  NEUTROABS 6.2 9.4*  HGB 13.5 13.3  HCT 39.1 38.7*  MCV 85.6 84.5  PLT 197 143*   Radiographic Studies: Ct Abdomen Pelvis Wo Contrast  11/02/2013   CLINICAL DATA:  Fever, urinary retention, dysuria. History of bladder cancer.  EXAM: CT ABDOMEN AND PELVIS WITHOUT CONTRAST  TECHNIQUE: Multidetector CT imaging of the abdomen and pelvis was performed following the standard protocol without IV contrast.  COMPARISON:  10/19/2012  FINDINGS: Dependent  bibasilar atelectasis noted.  Unenhanced liver, gallbladder, adrenal glands, spleen, and pancreas are unremarkable. Small duodenum diverticulum incidentally noted. Bilateral nonobstructing renal calculi are identified. 0.9 cm probable hyperdense left posterior renal cortical cyst identified image 39. There is a 6 mm proximal right ureteral calculus identified image 39. Nonspecific bilateral perinephric stranding is identified. No perinephric fluid collection. No free air or fluid.  Evidence of prior pelvic sidewall dissection. There is an ill-defined soft tissue mass at the left pelvic sidewall with apparent invasion of the bladder wall and adjacent left lateral pelvic sidewall structures measuring 4.9 x 4.3 cm image 81. Adjacent bladder wall thickening and surrounding stranding noted. This is separate from the adjacent sigmoid colon which appears unremarkable. A Foley catheter is in place and the bladder is decompressed. Right-sided decompressed probable bladder diverticulum or less likely lymphangioma/lymphocele is incidentally noted image 86. A probable bladder calculus is noted dependently image 87  measuring 4 mm.  Colonic diverticuli noted without evidence for diverticulitis. Small retroperitoneal nodes are identified, largest adjacent to the left common iliac chain measuring 5 mm image 69.  Mild lumbar spine disc degenerative change is noted. No lytic or sclerotic osseous lesion is identified.  IMPRESSION: 4.9 cm left pelvic sidewall mass arising from or invading the adjacent bladder (possibly within a previously seen bladder diverticulum), with bladder wall thickening identified and a dependent bladder calculus noted. This is highly suspicious for bladder carcinoma recurrence. Cystitis is felt less likely.  5 mm dominant left common iliac chain lymph node which is suspicious for nodal metastatic disease.  Right lateral bladder wall diverticulum. 6 mm proximal right ureteral calculus without hydroureteronephrosis.  Bilateral nonobstructing renal calculi.  These results were called by telephone at the time of interpretation on 11/02/2013 at 1:04 pm to Dr. Jinny Blossom, [facility] , who verbally acknowledged these results.   Electronically Signed   By: Conchita Paris M.D.   On: 11/02/2013 13:09   Dg Chest 2 View  11/02/2013   CLINICAL DATA:  Fever  EXAM: CHEST  2 VIEW  COMPARISON:  None.  FINDINGS: The heart size and mediastinal contours are within normal limits. Both lungs are clear. The visualized skeletal structures are unremarkable. Minimal biapical pleural thickening.  IMPRESSION: No active cardiopulmonary disease.   Electronically Signed   By: Conchita Paris M.D.   On: 11/02/2013 12:42    EKG: Independently reviewed. Normal sinus rhythm at 91 beats per minute. No ischemic changes.   Assessment/Plan:   Principal Problem:   Sepsis / Fever / rigors in a patient with renal calculi  Likely has a source of infection proximal to the stone seen on CT scan.  Urology plans to place a stent.  Treat with Rocephin for now. Followup blood cultures.  Active Problems:   Bladder mass/ Pelvic mass  Needs  further evaluation/workup and cystoscopy unrevealing.    Hypotension (history of hypertension)  Hemodynamically stable with fluids.    Hypokalemia  Supplement potassium in IV fluids.    DVT prophylaxis  Lovenox ordered.  Code Status: Full. Family Communication: Wife Rolondo Pierre [phone]) Disposition Plan: Home when stable.  Time spent: 70 minutes.  RAMA,CHRISTINA [facility] Pager [phone] Cell: [phone]   If 7PM-7AM, please contact night-coverage [email or web address] Password [email or web address] 11/03/2013, 2:15 PM    **Disclaimer: This note was dictated with voice recognition software. Similar sounding words can inadvertently be transcribed and this note may contain transcription errors which may not have been corrected upon publication of note.**

## 2013-11-03 NOTE — Op Note (Signed)
I was present for the entire procedure and agree with the above exam, cystoscopy and flouroscopic findings.

## 2013-11-03 NOTE — ED Notes (Addendum)
Pt seen in ED yesterday due to urinary retention, was told to called [facility] today, called office was told to come to ED this morning and called Dr. Johny Chess the on call urologist when pt arrived. Told pt may have stent placed today.

## 2013-11-03 NOTE — Addendum Note (Signed)
Addendum created 11/03/13 1903 by Lind Covert, CRNA   Modules edited: Anesthesia Medication Administration

## 2013-11-03 NOTE — Anesthesia Postprocedure Evaluation (Signed)
Anesthesia Post Note  Patient: Justin Huber  Procedure(s) Performed: Procedure(s) (LRB): CYSTOSCOPY WITH RIGHT RETROGRADE PYELOGRAM, RIGHT URETEROSCOPY AND STENT PLACEMENT, Litolipaxy (Right)  Anesthesia type: General  Patient location: PACU  Post pain: Pain level controlled  Post assessment: Post-op Vital signs reviewed  Last Vitals: BP 114/50  Pulse 100  Temp(Src) 37.4 C (Oral)  Resp 22  Ht 6\' 1"  (1.854 m)  Wt 217 lb 15.9 oz (98.88 kg)  BMI 28.77 kg/m2  SpO2 99%  Post vital signs: Reviewed  Level of consciousness: sedated  Complications: No apparent anesthesia complications

## 2013-11-03 NOTE — Anesthesia Preprocedure Evaluation (Addendum)
Anesthesia Evaluation  Patient identified by MRN, date of birth, ID band Patient awake    Reviewed: Allergy & Precautions, H&P , NPO status , Patient's Chart, lab work & pertinent test results  Airway Mallampati: II TM Distance: >3 FB Neck ROM: Full    Dental no notable dental hx.    Pulmonary neg pulmonary ROS, former smoker,  breath sounds clear to auscultation  Pulmonary exam normal       Cardiovascular hypertension, Pt. on medications Rhythm:Regular Rate:Normal     Neuro/Psych negative neurological ROS  negative psych ROS   GI/Hepatic negative GI ROS, Neg liver ROS,   Endo/Other  negative endocrine ROS  Renal/GU Renal disease     Musculoskeletal negative musculoskeletal ROS (+)   Abdominal   Peds  Hematology negative hematology ROS (+)   Anesthesia Other Findings   Reproductive/Obstetrics negative OB ROS                         Anesthesia Physical Anesthesia Plan  ASA: II and emergent  Anesthesia Plan: General   Post-op Pain Management:    Induction: Intravenous and Rapid sequence  Airway Management Planned: Oral ETT  Additional Equipment:   Intra-op Plan:   Post-operative Plan: Extubation in OR  Informed Consent: I have reviewed the patients History and Physical, chart, labs and discussed the procedure including the risks, benefits and alternatives for the proposed anesthesia with the patient or authorized representative who has indicated his/her understanding and acceptance.   Dental advisory given  Plan Discussed with: CRNA  Anesthesia Plan Comments:        Anesthesia Quick Evaluation

## 2013-11-03 NOTE — Consult Note (Signed)
Patient was seen and examined by me. I discussed the patient with Dr. Glo Herring. I reviewed all his notes, labs and imaging. Also reviewed the notes from [place]. He has rigors and shaking here in the preop area and we discussed were not completely certain that his fever and sepsis picture is from the right ureteral stone as his UA and urine culture were negative, but he could have higher grade obstruction and have pyelonephritis. Therefore we discussed proceeding with the stent. He elects to proceed.  Also would do an exam under anesthesia. We talked about the left pelvic mass. We will be doing cystoscopy as part of the stent placement which might help clarify the situation. I told him we would not do any resection or biopsy is right now again as he's got fever and hypotension and is not stable for that. Is diagnosis of bladder cancer remains unclear as it appears to first be documented what he was in [place]. All pathology in the [place] has been benign or knowledge.  We discussed in the long run the stent is temporary and he'll potentially need shockwave or ureteroscopy for the stent is removed.  His history of urinary retention is also unclear but again will be evaluating the urethra prostate and bladder tonight with cystoscopy and again hopefully this will clarify the situation. His urine cx were negative.

## 2013-11-03 NOTE — Transfer of Care (Signed)
Immediate Anesthesia Transfer of Care Note  Patient: Justin Huber  Procedure(s) Performed: Procedure(s): CYSTOSCOPY WITH RIGHT RETROGRADE PYELOGRAM, RIGHT URETEROSCOPY AND STENT PLACEMENT, Litolipaxy (Right)  Patient Location: PACU  Anesthesia Type:General  Level of Consciousness: sedated  Airway & Oxygen Therapy: Patient Spontanous Breathing and Patient connected to face mask oxygen  Post-op Assessment: Report given to PACU RN and Post -op Vital signs reviewed and stable  Post vital signs: Reviewed and stable  Complications: No apparent anesthesia complications

## 2013-11-03 NOTE — ED Provider Notes (Signed)
  This was a shared visit with a mid-level provided (NP or PA).  Throughout the patient's course I was available for consultation/collaboration.  I saw the ECG (if appropriate), relevant labs and studies - I agree with the interpretation.  On my exam the patient was in no distress.  He had already received placement of his Foley catheter, and was expressing yellow urine, approximately half liter on my evaluation. We had a length conversation about his history, need for ongoing evaluation with urology. On my sign-out the patient had the remainder of his eval pending, but on chart review it is evident that he had abnormal CT, but clinically improved.       Carmin Muskrat, MD 11/03/13 [phone]

## 2013-11-03 NOTE — Op Note (Signed)
Preoperative Diagnosis: fever, bladder stone, R UPJ stone, pelvic mass.  Postoperative Diagnosis:  Same, bladder diverticulum.  Procedure(s) Performed:  Cystourethroscopy, Right ureteral stent placement, Right retrograde pyelogram, Cystolithalopaxy less than 2.5 cm and Intraoperative fluoroscopy with interpretation <1 hr Exam under anesthesia.  Teaching Surgeon:  Festus Aloe, MD  Resident Surgeon:  Marta Antu, MD  Assistant(s):  none  Anesthesia:  General via endotracheal tube  Fluids:  See anesthesia record  Estimated blood loss:  0 mL  Specimens:  Bladder stone for analysis  Cultures:  none  Drains:  6x28cm R ureteral stent without string dangler. 20Fr foley catheter.  Complications:  none  Indications: 63yM with 24 hrs of fevers/rigors and "dirty" U/A but negative urine cultures. Also with urinary retention requiring foley, and R UPJ stone with minimal hydro and no flank pain. Unclear cause of infection. Also with incidentally discovered 5cm L paravesical mass concerning for bladder cancer recurrence even though we have yet to confirm his pathology (word of mouth is that his TURBT findings from [place] were benign).  Findings:   1. Exam under anesthesia was unremarkable.  2. Cystourethroscopy revealed a mildly enlarged prostate with a median bar. Mild bilateral lobar hypertrophy. Small bladder stone, removed intact. Narrow mouthed diverticulum at 10 o'clock without mucosal lesions. Throughout the bladder there was erythema and edema from foley placed 24 hrs prior, making definitive cystoscopic evaluation difficult. However, on the left bladder wall, there was a broad-based heaping of mucosa that was erythematous. It almost appeared as if this was not a mucosal lesion, but an infiltrative extravesical lesion. There was a separate lesion at 2'oclock that had a similar appearance.  3. The RPG showed no filling defect, but a radioopaque stone was identified in the pelvic  region, and there was mild hydronephrosis. There was no purulent debris that emanated from the pelvis once the wire was placed past the stone.  Description:  The patient was correctly identified in the preop holding area where written informed consent as well potential risk and complication reviewed. He agreed. They were brought back to the operative suite where a preinduction timeout was performed. Once correct information was verified, general anesthesia was induced via endotracheal tube. They were then gently placed into dorsal lithotomy position with SCDs in place for VTE prophylaxis. They were prepped and draped in the usual sterile fashion and given appropriate preoperative antibiotics.  We inserted a 20F rigid cystoscope per urethra with copious lubrication and normal saline irrigation running. This demonstrated findings as described above after evaluation with 30 and 70 degree lenses.   The bladder stone was evacuated intact. A 5Fr open ended catheter was inserted into the R UO and a RPG was performed with findings as above. A sensor wire was advanced up into the kidney past the stone. A stent was placed without difficulty.  A foley catheter was placed.  An exam under anesthesia was performed.  The patient's bladder was emptied, they were awoken from anesthesia, and taken to the recovery room in good condition.   Post Op Plan:   1. Transfer back to hospitalist service for ongoing workup of fevers/rigors. 2. Our impression is that the stone in the right UPJ is not the cause of his clinical picture. 3. CT abd/pelv with IV contrast to evaluate pelvic mass. If concerning for abscess, this could benefit from IR-guided drainage. Attestation:  Dr. Junious Silk was present for the entire procedure.    Justin Mcfarlane "Jed" Glo Herring, MD Resident, Department of Urology

## 2013-11-03 NOTE — ED Provider Notes (Signed)
CSN: [id number]     Arrival date & time 11/03/13  4098 History   First MD Initiated Contact with Patient 11/03/13 [phone]     Chief Complaint  Patient presents with  . Urinary Retention     (Consider location/radiation/quality/duration/timing/severity/associated sxs/prior Treatment) The history is provided by the patient.    Patient seen in the ED yesterday with diagnoses of urinary retention, presumed UTI (fevers, rigors, dysuria leading to urinary retention), right ureteral stone, pelvic mass.  States he has had fevers and rigors twice this morning, but feels improvement after the second round of keflex.  Denies abdominal or flank pain.  Is having good drainage into his foley bag, noted drainage to be pink yesterday and very dark today.  Denies N/V.  Is eating and drinking.  Has a very dry mouth and is thirsty.  Wife notes he was acting kind of "loopy" this morning and saying things that made him seem confused.   Symptoms began approximately 1 week ago with burning with urination and suprapubic pressure.  He started pyridium and went to urgent care - they were unable to process a urinalysis but did send a urine culture that per patient's wife did not grow anything.  He then progressed to urinary retention, was seen in ED yesterday, bladder shown to have 550cc and foley placed.  UA, labs unremarkable.  CT abd/pelvis showed right ureteral stone, left pelvic mass.  Pt did have a vasovagal episode with diaphoresis, SOB, palpitations, lightheadedness, hypotension in the setting of pain in his arm from the K infusion.  He was febrile and tachycardic on arrival but did not appear toxic.  Was d/c home with foley and keflex prescription, urology follow up.    Past Medical History  Diagnosis Date  . Benign bladder tumor   . Bladder diverticulum   . Kidney stone   . Hypertension   . Cancer   . Non Hodgkin's lymphoma 1980   Past Surgical History  Procedure Laterality Date  . Lithotripsy    .  Cystoscopy/retrograde/ureteroscopy/stone extraction with basket    . Bladder surgery  2011    small bladder tumor removed  . Nasal septum surgery     No family history on file. History  Substance Use Topics  . Smoking status: Former Smoker -- 1.00 packs/day for 13 years    Quit date: 10/22/1993  . Smokeless tobacco: Not on file  . Alcohol Use: Yes     Comment: wine occ    Review of Systems  All other systems reviewed and are negative.     Allergies  Review of patient's allergies indicates no known allergies.  Home Medications   Prior to Admission medications   Medication Sig Start Date End Date Taking? Authorizing Provider  aspirin EC 81 MG tablet Take 81 mg by mouth every evening.   Yes Historical Provider, MD  atorvastatin (LIPITOR) 10 MG tablet Take 10 mg by mouth every evening.   Yes Historical Provider, MD  cephALEXin (KEFLEX) 500 MG capsule Take 1 capsule (500 mg total) by mouth 3 (three) times daily. 11/02/13  Yes Britt Bottom, NP  hydrochlorothiazide (HYDRODIURIL) 25 MG tablet Take 12.5 mg by mouth every morning.   Yes Historical Provider, MD  irbesartan (AVAPRO) 150 MG tablet Take 150 mg by mouth daily.   Yes Historical Provider, MD  meloxicam (MOBIC) 7.5 MG tablet Take 1 tablet by mouth 2 (two) times daily. 10/06/13  Yes Historical Provider, MD   BP 147/65  Pulse 121  Temp(Src)  98.5 F (36.9 C) (Oral)  Resp 18  SpO2 96% Physical Exam  Nursing note and vitals reviewed. Constitutional: He appears well-developed and well-nourished. No distress.  HENT:  Head: Normocephalic and atraumatic.  Eyes: Conjunctivae are normal.  Neck: Normal range of motion. Neck supple.  Cardiovascular: Normal rate and regular rhythm.   Pulmonary/Chest: Effort normal and breath sounds normal. No respiratory distress. He has no wheezes. He has no rales.  Abdominal: Soft. Bowel sounds are normal. He exhibits no distension and no mass. There is no tenderness. There is no rebound, no  guarding and no CVA tenderness.  Genitourinary:  Foley in place draining dark tea-colored urine in leg bag.   Neurological: He is alert. He exhibits normal muscle tone.  Skin: He is not diaphoretic.  Psychiatric: He has a normal mood and affect. His behavior is normal.    ED Course  Procedures (including critical care time) Labs Review Labs Reviewed  CBC WITH DIFFERENTIAL - Abnormal; Notable for the following:    HCT 38.7 (*)    Platelets 143 (*)    Neutrophils Relative % 98 (*)    Lymphocytes Relative 1 (*)    Monocytes Relative 1 (*)    Neutro Abs 9.4 (*)    Lymphs Abs 0.1 (*)    All other components within normal limits  BASIC METABOLIC PANEL - Abnormal; Notable for the following:    Potassium 3.5 (*)    Glucose, Bld 106 (*)    Anion gap 17 (*)    All other components within normal limits  URINALYSIS, ROUTINE W REFLEX MICROSCOPIC - Abnormal; Notable for the following:    Color, Urine RED (*)    APPearance CLOUDY (*)    Hgb urine dipstick LARGE (*)    Bilirubin Urine MODERATE (*)    Ketones, ur 15 (*)    Protein, ur 100 (*)    Leukocytes, UA SMALL (*)    All other components within normal limits  CULTURE, BLOOD (ROUTINE X 2)  CULTURE, BLOOD (ROUTINE X 2)  URINE MICROSCOPIC-ADD ON  HEPATIC FUNCTION PANEL    Imaging Review Ct Abdomen Pelvis Wo Contrast  11/02/2013   CLINICAL DATA:  Fever, urinary retention, dysuria. History of bladder cancer.  EXAM: CT ABDOMEN AND PELVIS WITHOUT CONTRAST  TECHNIQUE: Multidetector CT imaging of the abdomen and pelvis was performed following the standard protocol without IV contrast.  COMPARISON:  10/19/2012  FINDINGS: Dependent bibasilar atelectasis noted.  Unenhanced liver, gallbladder, adrenal glands, spleen, and pancreas are unremarkable. Small duodenum diverticulum incidentally noted. Bilateral nonobstructing renal calculi are identified. 0.9 cm probable hyperdense left posterior renal cortical cyst identified image 39. There is a 6 mm  proximal right ureteral calculus identified image 39. Nonspecific bilateral perinephric stranding is identified. No perinephric fluid collection. No free air or fluid.  Evidence of prior pelvic sidewall dissection. There is an ill-defined soft tissue mass at the left pelvic sidewall with apparent invasion of the bladder wall and adjacent left lateral pelvic sidewall structures measuring 4.9 x 4.3 cm image 81. Adjacent bladder wall thickening and surrounding stranding noted. This is separate from the adjacent sigmoid colon which appears unremarkable. A Foley catheter is in place and the bladder is decompressed. Right-sided decompressed probable bladder diverticulum or less likely lymphangioma/lymphocele is incidentally noted image 86. A probable bladder calculus is noted dependently image 87 measuring 4 mm.  Colonic diverticuli noted without evidence for diverticulitis. Small retroperitoneal nodes are identified, largest adjacent to the left common iliac chain measuring 5  mm image 69.  Mild lumbar spine disc degenerative change is noted. No lytic or sclerotic osseous lesion is identified.  IMPRESSION: 4.9 cm left pelvic sidewall mass arising from or invading the adjacent bladder (possibly within a previously seen bladder diverticulum), with bladder wall thickening identified and a dependent bladder calculus noted. This is highly suspicious for bladder carcinoma recurrence. Cystitis is felt less likely.  5 mm dominant left common iliac chain lymph node which is suspicious for nodal metastatic disease.  Right lateral bladder wall diverticulum. 6 mm proximal right ureteral calculus without hydroureteronephrosis.  Bilateral nonobstructing renal calculi.  These results were called by telephone at the time of interpretation on 11/02/2013 at 1:04 pm to Dr. Jinny Blossom, [facility] , who verbally acknowledged these results.   Electronically Signed   By: Conchita Paris M.D.   On: 11/02/2013 13:09   Dg Chest 2 View  11/02/2013    CLINICAL DATA:  Fever  EXAM: CHEST  2 VIEW  COMPARISON:  None.  FINDINGS: The heart size and mediastinal contours are within normal limits. Both lungs are clear. The visualized skeletal structures are unremarkable. Minimal biapical pleural thickening.  IMPRESSION: No active cardiopulmonary disease.   Electronically Signed   By: Conchita Paris M.D.   On: 11/02/2013 12:42     EKG Interpretation None      9:51 AM I spoke with Dr Junious Silk regarding this patient.  He reports that the patient told the office he was still having fevers and therefore he thought he should be sent to the ED because if he is septic and has a stone, he will need a stent.  He has reviewed the CT and notes that the mass was there on the 2014 scan.    10:23 AM Discussed pt with Dr Tawnya Crook.   12:41 PM Patient is febrile, tachycardic.  Discussed again with Dr Junious Silk.  He will see pt and will place stent today.  Requests hospitalist admission.    MDM   Final diagnoses:  Fever, unspecified fever cause  Urinary retention  Ureteral stone  Pelvic mass  Tachycardia    Pt with approximately one week of urinary symptoms, burning and pressure, that progressed to urinary retention.  Seen in ED yesterday for same.  Foley placed.  Found to have right ureteral stone and new pelvic mass.  D/C home with change in abx from cipro to keflex.  Pt continued to have fevers and rigors at home, advised to return to ED by urology office.  Pt was tachycardic and febrile in ED today. Clinically dehydrated.  Discussed twice with Dr Junious Silk who will see pt today and place a stent.  Pt admitted to [facility], Dr Rama accepted.  Blood cultures and urine culture pending.      Clayton Bibles, PA-C 11/03/13 1505

## 2013-11-03 NOTE — Consult Note (Signed)
Urology Consult   Physician requesting consult: Dr. Rockne Menghini  Reason for consult: Fever, nephrolithiasis  History of Present Illness: Justin Huber is a 63 y.o. with complex urologic history who comes in with 2 days of fevers, chills, and urinary retention.  Last Tuesday, he had symptoms of a UTI (he is familiar with these symptoms due to past UTIs during surveillance cystoscopy for his bladder mass) and went to urgent care where he was told he had a UTI and was started on empiric cipro. Subsequently, they called on Friday and told him that his culture was negative, but to finish his 7-day course of cipro. On Sat night, he had fevers to 103 and rigors at home and came to the ED. He had a bladder scan for 560 with suprapubic discomfort and inability to void, and a catheter was placed with return of 500cc of clear urine and relief. A CT scan was obtained which showed a small stone in the bladder, a 71mm Right UPJ stone with minimal hydro, but perinephric stranding, and a 5cm perivesical mass that was concerning for bladder cancer recurrence. He was advised to call Dr. Sammie Bench office this morning, but when Dr. Janice Norrie was found to be on vacation, was advised to come to the ED for ureteral stenting.  I don't have access to the pathology report, but according to Dr. 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 This all started in [place] in 2011 when he had an episode of painless gross hematuria that he felt was probably his nephrolithiasis. A [place] urologist found a bladder mass that he called "cancer", but when they received the pathology report and had it translated, it stated "no bladder cancer identified". Subsequent bladder cytologies were "atypical", and eventually he had another bladder mass found by Dr. Janice Norrie in a diverticulum. He was referred to [facility] (Dr. Dimas Millin) where a partial cystectomy and bilateral PLND was performed. I am able to review this pathology,  and it was all negative for malignancy. There was only severe polypoid inflammation.   The patient denies recent mild weak stream, but has never thought to take medications for this. He denies hematuria before the catheter was placed. No abdominal pain, N/V/D/C. No leg swelling. No cough. No headaches or neurologic symptoms. No skin rashes.  Past Medical History  Diagnosis Date  . Benign bladder tumor   . Bladder diverticulum   . Kidney stone   . Hypertension   . Cancer   . Non Hodgkin's lymphoma 1980  . Hyperlipidemia     Past Surgical History  Procedure Laterality Date  . Lithotripsy    . Cystoscopy/retrograde/ureteroscopy/stone extraction with basket    . Bladder surgery  2011    small bladder tumor removed  . Nasal septum surgery      Current Hospital Medications:  Home Meds:    Medication List    ASK your doctor about these medications       aspirin EC 81 MG tablet  Take 81 mg by mouth every evening.     atorvastatin 10 MG tablet  Commonly known as:  LIPITOR  Take 10 mg by mouth every evening.     cephALEXin 500 MG capsule  Commonly known as:  KEFLEX  Take 1 capsule (500 mg total) by mouth 3 (three) times daily.     hydrochlorothiazide 25 MG tablet  Commonly known as:  HYDRODIURIL  Take 12.5 mg by mouth every morning.  irbesartan 150 MG tablet  Commonly known as:  AVAPRO  Take 150 mg by mouth daily.     meloxicam 7.5 MG tablet  Commonly known as:  MOBIC  Take 1 tablet by mouth 2 (two) times daily.        Scheduled Meds:  Continuous Infusions: . cefTRIAXone (ROCEPHIN) IVPB 1 gram/50 mL D5W     PRN Meds:.  Allergies: No Known Allergies  History reviewed. No pertinent family history.  Social History:  reports that he quit smoking about 20 years ago. He has never used smokeless tobacco. He reports that he drinks alcohol. He reports that he does not use illicit drugs.  ROS: A complete review of systems was performed.  All systems are negative  except for pertinent findings as noted.  Physical Exam:  Vital signs in last 24 hours: Temp:  [98.5 F (36.9 C)-101.3 F (38.5 C)] 99 F (37.2 C) (08/03 1302) Pulse Rate:  [91-121] 95 (08/03 1302) Resp:  [16-27] 16 (08/03 1302) BP: (111-147)/(50-74) 112/56 mmHg (08/03 1302) SpO2:  [94 %-100 %] 94 % (08/03 1302) Constitutional:  Alert and oriented, No acute distress Cardiovascular: Regular rate and rhythm, No JVD Respiratory: Normal respiratory effort, Lungs clear bilaterally GI: Abdomen is soft, nontender, nondistended, no abdominal masses GU: No CVA tenderness. Testes descended bilaterally without tenderness or masses. Rectal: prostate 2+, no nodules. No bogginess, nontender. Lymphatic: No lymphadenopathy Neurologic: Grossly intact, no focal deficits Psychiatric: Normal mood and affect  Laboratory Data:   Recent Labs  11/02/13 0721 11/03/13 1039  WBC 6.4 9.6  HGB 13.5 13.3  HCT 39.1 38.7*  PLT 197 143*     Recent Labs  11/02/13 0721 11/03/13 1039  NA 136* 139  K 2.8* 3.5*  CL 97 103  GLUCOSE 137* 106*  BUN 12 20  CALCIUM 10.9* 10.5  CREATININE 0.72 0.82     Results for orders placed during the hospital encounter of 11/03/13 (from the past 24 hour(s))  CBC WITH DIFFERENTIAL     Status: Abnormal   Collection Time    11/03/13 10:39 AM      Result Value Ref Range   WBC 9.6  4.0 - 10.5 K/uL   RBC 4.58  4.22 - 5.81 MIL/uL   Hemoglobin 13.3  13.0 - 17.0 g/dL   HCT 38.7 (*) 39.0 - 52.0 %   MCV 84.5  78.0 - 100.0 fL   MCH 29.0  26.0 - 34.0 pg   MCHC 34.4  30.0 - 36.0 g/dL   RDW 11.9  11.5 - 15.5 %   Platelets 143 (*) 150 - 400 K/uL   Neutrophils Relative % 98 (*) 43 - 77 %   Lymphocytes Relative 1 (*) 12 - 46 %   Monocytes Relative 1 (*) 3 - 12 %   Eosinophils Relative 0  0 - 5 %   Basophils Relative 0  0 - 1 %   Band Neutrophils 0  0 - 10 %   Metamyelocytes Relative 0     Myelocytes 0     Promyelocytes Absolute 0     Blasts 0     nRBC 0  0 /100 WBC    Neutro Abs 9.4 (*) 1.7 - 7.7 K/uL   Lymphs Abs 0.1 (*) 0.7 - 4.0 K/uL   Monocytes Absolute 0.1  0.1 - 1.0 K/uL   Eosinophils Absolute 0.0  0.0 - 0.7 K/uL   Basophils Absolute 0.0  0.0 - 0.1 K/uL   WBC Morphology TOXIC GRANULATION  BASIC METABOLIC PANEL     Status: Abnormal   Collection Time    11/03/13 10:39 AM      Result Value Ref Range   Sodium 139  137 - 147 mEq/L   Potassium 3.5 (*) 3.7 - 5.3 mEq/L   Chloride 103  96 - 112 mEq/L   CO2 19  19 - 32 mEq/L   Glucose, Bld 106 (*) 70 - 99 mg/dL   BUN 20  6 - 23 mg/dL   Creatinine, Ser 0.82  0.50 - 1.35 mg/dL   Calcium 10.5  8.4 - 10.5 mg/dL   GFR calc non Af Amer >90  >90 mL/min   GFR calc Af Amer >90  >90 mL/min   Anion gap 17 (*) 5 - 15  URINALYSIS, ROUTINE W REFLEX MICROSCOPIC     Status: Abnormal   Collection Time    11/03/13 10:44 AM      Result Value Ref Range   Color, Urine RED (*) YELLOW   APPearance CLOUDY (*) CLEAR   Specific Gravity, Urine 1.030  1.005 - 1.030   pH 6.0  5.0 - 8.0   Glucose, UA NEGATIVE  NEGATIVE mg/dL   Hgb urine dipstick LARGE (*) NEGATIVE   Bilirubin Urine MODERATE (*) NEGATIVE   Ketones, ur 15 (*) NEGATIVE mg/dL   Protein, ur 100 (*) NEGATIVE mg/dL   Urobilinogen, UA 1.0  0.0 - 1.0 mg/dL   Nitrite NEGATIVE  NEGATIVE   Leukocytes, UA SMALL (*) NEGATIVE  URINE MICROSCOPIC-ADD ON     Status: None   Collection Time    11/03/13 10:44 AM      Result Value Ref Range   WBC, UA 0-2  <3 WBC/hpf   RBC / HPF TOO NUMEROUS TO COUNT  <3 RBC/hpf   Urine-Other MUCOUS PRESENT     Recent Results (from the past 240 hour(s))  URINE CULTURE     Status: None   Collection Time    11/02/13  7:01 AM      Result Value Ref Range Status   Specimen Description URINE, CATHETERIZED   Final   Special Requests NONE   Final   Culture  Setup Time     Final   Value: 11/02/2013 09:55     Performed at [facility]     Final   Value: NO GROWTH     Performed at [facility]   Culture      Final   Value: NO GROWTH     Performed at [facility]   Report Status 11/03/2013 FINAL   Final    Renal Function:  Recent Labs  11/02/13 0721 11/03/13 1039  CREATININE 0.72 0.82   The CrCl is unknown because both a height and weight (above a minimum accepted value) are required for this calculation.  Radiologic Imaging: Ct Abdomen Pelvis Wo Contrast  11/02/2013   CLINICAL DATA:  Fever, urinary retention, dysuria. History of bladder cancer.  EXAM: CT ABDOMEN AND PELVIS WITHOUT CONTRAST  TECHNIQUE: Multidetector CT imaging of the abdomen and pelvis was performed following the standard protocol without IV contrast.  COMPARISON:  10/19/2012  FINDINGS: Dependent bibasilar atelectasis noted.  Unenhanced liver, gallbladder, adrenal glands, spleen, and pancreas are unremarkable. Small duodenum diverticulum incidentally noted. Bilateral nonobstructing renal calculi are identified. 0.9 cm probable hyperdense left posterior renal cortical cyst identified image 39. There is a 6 mm proximal right ureteral calculus identified image 39. Nonspecific bilateral perinephric stranding is identified. No  perinephric fluid collection. No free air or fluid.  Evidence of prior pelvic sidewall dissection. There is an ill-defined soft tissue mass at the left pelvic sidewall with apparent invasion of the bladder wall and adjacent left lateral pelvic sidewall structures measuring 4.9 x 4.3 cm image 81. Adjacent bladder wall thickening and surrounding stranding noted. This is separate from the adjacent sigmoid colon which appears unremarkable. A Foley catheter is in place and the bladder is decompressed. Right-sided decompressed probable bladder diverticulum or less likely lymphangioma/lymphocele is incidentally noted image 86. A probable bladder calculus is noted dependently image 87 measuring 4 mm.  Colonic diverticuli noted without evidence for diverticulitis. Small retroperitoneal nodes are identified, largest  adjacent to the left common iliac chain measuring 5 mm image 69.  Mild lumbar spine disc degenerative change is noted. No lytic or sclerotic osseous lesion is identified.  IMPRESSION: 4.9 cm left pelvic sidewall mass arising from or invading the adjacent bladder (possibly within a previously seen bladder diverticulum), with bladder wall thickening identified and a dependent bladder calculus noted. This is highly suspicious for bladder carcinoma recurrence. Cystitis is felt less likely.  5 mm dominant left common iliac chain lymph node which is suspicious for nodal metastatic disease.  Right lateral bladder wall diverticulum. 6 mm proximal right ureteral calculus without hydroureteronephrosis.  Bilateral nonobstructing renal calculi.  These results were called by telephone at the time of interpretation on 11/02/2013 at 1:04 pm to Dr. Jinny Blossom, [facility] , who verbally acknowledged these results.   Electronically Signed   By: Conchita Paris M.D.   On: 11/02/2013 13:09   Dg Chest 2 View  11/02/2013   CLINICAL DATA:  Fever  EXAM: CHEST  2 VIEW  COMPARISON:  None.  FINDINGS: The heart size and mediastinal contours are within normal limits. Both lungs are clear. The visualized skeletal structures are unremarkable. Minimal biapical pleural thickening.  IMPRESSION: No active cardiopulmonary disease.   Electronically Signed   By: Conchita Paris M.D.   On: 11/02/2013 12:42    I independently reviewed the above imaging studies.  Impression/Recommendation 63yM with complex urologic history including bladder diverticulectomy and bilateral PLND for subsequently benign pathology (after TURBT showing low grade Ta (path unavailable)) with 2 days of fevers/chills and an asymptomatic 40mm proximal R UPJ stone without hydro but with perinephric stranding, and a 5cm left perivesical mass without tenderness on abdominal exam. No leukocytosis. No other localizing symptoms for his fever.  At this point, we discussed the possibility  that he has an obstructing stone with pyelonephritis that could be causing his symptoms. I recommended cystoscopy and stent placement. He agreed and consented to this after discussing risks/benefits/alternative therapies.  We also discussed the possibility that his pelvic mass represents a recurrence of a bladder tumor. The timing for lymphocele is odd, given a relatively normal CT a year ago and 2 years after his surgery. We discussed re-imaging this with a contrasted pelvic CT scan, especially if cystoscopy is unrevealing.  Discussed with Dr. Junious Silk.  Margo Aye 11/03/2013, 1:53 PM

## 2013-11-04 ENCOUNTER — Inpatient Hospital Stay (HOSPITAL_COMMUNITY): Payer: Medicare HMO

## 2013-11-04 ENCOUNTER — Encounter (HOSPITAL_COMMUNITY): Payer: Self-pay | Admitting: Radiology

## 2013-11-04 DIAGNOSIS — D649 Anemia, unspecified: Secondary | ICD-10-CM

## 2013-11-04 DIAGNOSIS — A419 Sepsis, unspecified organism: Secondary | ICD-10-CM

## 2013-11-04 DIAGNOSIS — R6520 Severe sepsis without septic shock: Secondary | ICD-10-CM

## 2013-11-04 DIAGNOSIS — D696 Thrombocytopenia, unspecified: Secondary | ICD-10-CM

## 2013-11-04 LAB — GLUCOSE, CAPILLARY
GLUCOSE-CAPILLARY: 204 mg/dL — AB (ref 70–99)
Glucose-Capillary: 214 mg/dL — ABNORMAL HIGH (ref 70–99)

## 2013-11-04 LAB — CBC
HCT: 36.7 % — ABNORMAL LOW (ref 39.0–52.0)
Hemoglobin: 12.4 g/dL — ABNORMAL LOW (ref 13.0–17.0)
MCH: 29.3 pg (ref 26.0–34.0)
MCHC: 33.8 g/dL (ref 30.0–36.0)
MCV: 86.8 fL (ref 78.0–100.0)
PLATELETS: 134 10*3/uL — AB (ref 150–400)
RBC: 4.23 MIL/uL (ref 4.22–5.81)
RDW: 12.2 % (ref 11.5–15.5)
WBC: 8.6 10*3/uL (ref 4.0–10.5)

## 2013-11-04 LAB — BASIC METABOLIC PANEL
Anion gap: 9 (ref 5–15)
BUN: 19 mg/dL (ref 6–23)
CO2: 24 mEq/L (ref 19–32)
Calcium: 9.9 mg/dL (ref 8.4–10.5)
Chloride: 104 mEq/L (ref 96–112)
Creatinine, Ser: 0.78 mg/dL (ref 0.50–1.35)
Glucose, Bld: 290 mg/dL — ABNORMAL HIGH (ref 70–99)
POTASSIUM: 4 meq/L (ref 3.7–5.3)
SODIUM: 137 meq/L (ref 137–147)

## 2013-11-04 MED ORDER — ASPIRIN EC 81 MG PO TBEC
81.0000 mg | DELAYED_RELEASE_TABLET | Freq: Every day | ORAL | Status: DC
  Administered 2013-11-04 – 2013-11-09 (×6): 81 mg via ORAL
  Filled 2013-11-04 (×6): qty 1

## 2013-11-04 MED ORDER — TAMSULOSIN HCL 0.4 MG PO CAPS
0.4000 mg | ORAL_CAPSULE | Freq: Every day | ORAL | Status: DC
  Administered 2013-11-04 – 2013-11-09 (×6): 0.4 mg via ORAL
  Filled 2013-11-04 (×6): qty 1

## 2013-11-04 MED ORDER — ENOXAPARIN SODIUM 40 MG/0.4ML ~~LOC~~ SOLN
40.0000 mg | SUBCUTANEOUS | Status: DC
  Filled 2013-11-04: qty 0.4

## 2013-11-04 MED ORDER — IOHEXOL 300 MG/ML  SOLN
125.0000 mL | Freq: Once | INTRAMUSCULAR | Status: AC | PRN
  Administered 2013-11-04: 125 mL via INTRAVENOUS

## 2013-11-04 MED ORDER — ENOXAPARIN SODIUM 40 MG/0.4ML ~~LOC~~ SOLN
40.0000 mg | Freq: Once | SUBCUTANEOUS | Status: DC
Start: 2013-11-04 — End: 2013-11-05
  Filled 2013-11-04: qty 0.4

## 2013-11-04 MED ORDER — IOHEXOL 300 MG/ML  SOLN: 25.0000 mL | INTRAMUSCULAR | Status: AC

## 2013-11-04 MED ORDER — INSULIN ASPART 100 UNIT/ML ~~LOC~~ SOLN
0.0000 [IU] | Freq: Three times a day (TID) | SUBCUTANEOUS | Status: DC
  Administered 2013-11-04: 3 [IU] via SUBCUTANEOUS
  Administered 2013-11-05: 1 [IU] via SUBCUTANEOUS
  Administered 2013-11-05: 2 [IU] via SUBCUTANEOUS
  Administered 2013-11-06 – 2013-11-07 (×2): 1 [IU] via SUBCUTANEOUS

## 2013-11-04 NOTE — ED Provider Notes (Signed)
Medical screening examination/treatment/procedure(s) were performed by non-physician practitioner and as supervising physician I was immediately available for consultation/collaboration.   EKG Interpretation   Date/Time:  Sunday November 02 2013 09:47:41 EDT Ventricular Rate:  91 PR Interval:  168 QRS Duration: 84 QT Interval:  361 QTC Calculation: 444 R Axis:   51 Text Interpretation:  Sinus rhythm No significant change since last  tracing Confirmed by Saber Dickerman  MD, Lorna Strother [phone]) on 11/02/2013 9:52:22 AM     '  Neta Ehlers, MD 11/04/13 1351

## 2013-11-04 NOTE — Progress Notes (Signed)
1 Day Post-Op Subjective: No complaints. Tolerating stent and foley well. No fevers/chills overnight. Feels subjectively better.  Objective: Vital signs in last 24 hours: Temp:  [97.4 F (36.3 C)-101.3 F (38.5 C)] 97.4 F (36.3 C) (08/04 0517) Pulse Rate:  [64-121] 64 (08/04 0517) Resp:  [16-24] 20 (08/04 0517) BP: (83-149)/(46-65) 91/64 mmHg (08/04 0517) SpO2:  [93 %-100 %] 97 % (08/04 0517) Weight:  [98.88 kg (217 lb 15.9 oz)] 98.88 kg (217 lb 15.9 oz) (08/03 1438)  Intake/Output from previous day: 08/03 0701 - 08/04 0700 In: 700 [I.V.:700] Out: 700 [Urine:700] Intake/Output this shift:    Physical Exam:  General: Alert and oriented CV: RRR Lungs: Clear Abdomen: Soft, ND No CVA tenderness. Urine clear in foley. Ext: NT, No erythema  Lab Results:  Recent Labs  11/02/13 0721 11/03/13 1039 11/04/13 0402  HGB 13.5 13.3 12.4*  HCT 39.1 38.7* 36.7*   BMET  Recent Labs  11/03/13 1039 11/04/13 0402  NA 139 137  K 3.5* 4.0  CL 103 104  CO2 19 24  GLUCOSE 106* 290*  BUN 20 19  CREATININE 0.82 0.78  CALCIUM 10.5 9.9     Studies/Results: Ct Abdomen Pelvis Wo Contrast  11/02/2013   CLINICAL DATA:  Fever, urinary retention, dysuria. History of bladder cancer.  EXAM: CT ABDOMEN AND PELVIS WITHOUT CONTRAST  TECHNIQUE: Multidetector CT imaging of the abdomen and pelvis was performed following the standard protocol without IV contrast.  COMPARISON:  10/19/2012  FINDINGS: Dependent bibasilar atelectasis noted.  Unenhanced liver, gallbladder, adrenal glands, spleen, and pancreas are unremarkable. Small duodenum diverticulum incidentally noted. Bilateral nonobstructing renal calculi are identified. 0.9 cm probable hyperdense left posterior renal cortical cyst identified image 39. There is a 6 mm proximal right ureteral calculus identified image 39. Nonspecific bilateral perinephric stranding is identified. No perinephric fluid collection. No free air or fluid.  Evidence of  prior pelvic sidewall dissection. There is an ill-defined soft tissue mass at the left pelvic sidewall with apparent invasion of the bladder wall and adjacent left lateral pelvic sidewall structures measuring 4.9 x 4.3 cm image 81. Adjacent bladder wall thickening and surrounding stranding noted. This is separate from the adjacent sigmoid colon which appears unremarkable. A Foley catheter is in place and the bladder is decompressed. Right-sided decompressed probable bladder diverticulum or less likely lymphangioma/lymphocele is incidentally noted image 86. A probable bladder calculus is noted dependently image 87 measuring 4 mm.  Colonic diverticuli noted without evidence for diverticulitis. Small retroperitoneal nodes are identified, largest adjacent to the left common iliac chain measuring 5 mm image 69.  Mild lumbar spine disc degenerative change is noted. No lytic or sclerotic osseous lesion is identified.  IMPRESSION: 4.9 cm left pelvic sidewall mass arising from or invading the adjacent bladder (possibly within a previously seen bladder diverticulum), with bladder wall thickening identified and a dependent bladder calculus noted. This is highly suspicious for bladder carcinoma recurrence. Cystitis is felt less likely.  5 mm dominant left common iliac chain lymph node which is suspicious for nodal metastatic disease.  Right lateral bladder wall diverticulum. 6 mm proximal right ureteral calculus without hydroureteronephrosis.  Bilateral nonobstructing renal calculi.  These results were called by telephone at the time of interpretation on 11/02/2013 at 1:04 pm to Dr. Jinny Blossom, [facility] , who verbally acknowledged these results.   Electronically Signed   By: Conchita Paris M.D.   On: 11/02/2013 13:09   Dg Chest 2 View  11/02/2013   CLINICAL DATA:  Fever  EXAM: CHEST  2 VIEW  COMPARISON:  None.  FINDINGS: The heart size and mediastinal contours are within normal limits. Both lungs are clear. The visualized skeletal  structures are unremarkable. Minimal biapical pleural thickening.  IMPRESSION: No active cardiopulmonary disease.   Electronically Signed   By: Conchita Paris M.D.   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 Multiple negative urine cultures over past week. He is POD#1 from cysto, R stent placement with no purulence behind stone. Also with foley irritation, and submucosal lesion on L lateral wall of bladder. Will need cysto, stone extraction, and bladder biopsy in a few weeks once infectious source is identified and treated.  Awaiting CT abd/pelv to further characterize his pelvic mass. If concern for abscess, may recommend percutaneous drainage. Please keep NPO and hold lovenox until this is determined.  Will keep foley catheter in for one additional day with voiding trial tomorrow. It is possible that the stone we found in his bladder was obstructing his urethra.  Discussed with Dr. Junious Silk.   LOS: 1 day   Margo Aye 11/04/2013, 8:39 AM

## 2013-11-04 NOTE — Progress Notes (Signed)
Patient ID: Justin Huber, male   DOB: 1950/09/27, 63 y.o.   MRN: [id number] Request received for CT guided aspiration/possible drainage of left pelvic fluid collection/?infected lymphocele in pt. 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 Multiple negative urine cultures over past week. He is POD#1 from cysto, R stent placement with no purulence behind stone. Also with foley irritation, and submucosal lesion on L lateral wall of bladder. Recent CT revealed low-density collection in the lateral aspect of the left bladder wall extending to the pelvic sidewall with differential including cystic wall abscess versus bladder cancer recurrence.Imaging studies were reviewed by Dr. Vernard Gambles. Additional PMH as below. Exam: pt awake/alert; chest- CTA bilat; heart- RRR; abd- soft,+BS,NT; ext- FROM,no edema.   Filed Vitals:   11/04/13 0201 11/04/13 0219 11/04/13 0517 11/04/13 1009  BP: 83/46 112/62 91/64 101/60  Pulse: 71  64 67  Temp: 97.5 F (36.4 C)  97.4 F (36.3 C) 97.6 F (36.4 C)  TempSrc: Oral  Oral Oral  Resp: 20  20 18   Height:      Weight:      SpO2: 93%  97% 95%   Past Medical History  Diagnosis Date  . Benign bladder tumor   . Bladder diverticulum   . Kidney stone   . Hypertension   . Cancer   . Non Hodgkin's lymphoma 1980  . Hyperlipidemia    Past Surgical History  Procedure Laterality Date  . Lithotripsy    . Cystoscopy/retrograde/ureteroscopy/stone extraction with basket    . Bladder surgery  2011    small bladder tumor removed  . Nasal septum surgery    . Cystoscopy with retrograde pyelogram, ureteroscopy and stent placement Right 11/03/2013    Procedure: CYSTOSCOPY WITH RIGHT RETROGRADE PYELOGRAM, RIGHT  URETEROSCOPY AND STENT PLACEMENT, Litolipaxy;  Surgeon: Festus Aloe, MD;  Location: [facility];  Service: Urology;  Laterality: Right;   Ct Abdomen Pelvis W Wo Contrast  11/04/2013   CLINICAL DATA:  Evaluate bladder and pelvic mass. Status post stone extraction right stent placement.  EXAM: CT ABDOMEN AND PELVIS WITHOUT AND WITH CONTRAST  TECHNIQUE: Multidetector CT imaging of the abdomen and pelvis was performed following the standard protocol before and following the bolus administration of intravenous contrast.  CONTRAST:  174mL OMNIPAQUE IOHEXOL 300 MG/ML  SOLN  COMPARISON:  CT 11/02/2013  FINDINGS: Lung bases are clear. There are small bilateral pleural effusions. There is a low-density lesion along the cardiac border at the level of the right atrium which is not changed from CT of 01/09/2011 measuring 4.7 by 1.6 cm - image 1.  No focal hepatic lesion. The gallbladder, pancreas, spleen, adrenal glands are normal.  Renal: Noncontrast exam demonstrates several sub 5 mm calculi within the left and right kidney. There is interval extraction the calculus in the right renal pelvis. There is interval placement of a right ureteral stent. No hydronephrosis. The cortical phase imaging demonstrates no enhancing renal cortical lesion. There is a high-density nonenhancing cyst extending from the lower pole of the left kidney.  Delayed pyelogram phase imaging demonstrates no filling defects within the collecting system and ureters. No evidence of obstruction.  In the pelvis, there is a Foley catheter within the bladder. Within the left lateral  wall of the bladder there is a a 5.0 x 3.8 cm low-density collection which extends from the bladder wall to the left pelvic sidewall has simple fluid attenuation a thin enhancing rim (image 86, series 4). Delayed imaging demonstrates a large bladder diverticulum on the right.  There is no pelvic lymphadenopathy. Rectum is normal. Abdominal aorta is normal. No retroperitoneal  lymphadenopathy.  IMPRESSION: 1. Interval extraction of right renal pelvic calculus and placement of a right ureteral stent. 2. No evidence of hydronephrosis or obstruction. Bilateral nephrolithiasis. 3. Low-density collection in the lateral aspect of the left bladder wall extending to the pelvic sidewall with differential including cystic wall abscess versus bladder cancer recurrence. 4. 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   On: 11/04/2013 10:38   Ct Abdomen Pelvis Wo Contrast  11/02/2013   CLINICAL DATA:  Fever, urinary retention, dysuria. History of bladder cancer.  EXAM: CT ABDOMEN AND PELVIS WITHOUT CONTRAST  TECHNIQUE: Multidetector CT imaging of the abdomen and pelvis was performed following the standard protocol without IV contrast.  COMPARISON:  10/19/2012  FINDINGS: Dependent bibasilar atelectasis noted.  Unenhanced liver, gallbladder, adrenal glands, spleen, and pancreas are unremarkable. Small duodenum diverticulum incidentally noted. Bilateral nonobstructing renal calculi are identified. 0.9 cm probable hyperdense left posterior renal cortical cyst identified image 39. There is a 6 mm proximal right ureteral calculus identified image 39. Nonspecific bilateral perinephric stranding is identified. No perinephric fluid collection. No free air or fluid.  Evidence of prior pelvic sidewall dissection. There is an ill-defined soft tissue mass at the left pelvic sidewall with apparent invasion of the bladder wall and adjacent left lateral pelvic sidewall structures measuring 4.9 x 4.3 cm image 81. Adjacent bladder wall thickening and surrounding stranding noted. This is separate from the adjacent sigmoid colon which appears unremarkable. A Foley catheter is in place and the bladder is decompressed. Right-sided decompressed probable bladder diverticulum or less likely lymphangioma/lymphocele is incidentally noted image 86. A  probable bladder calculus is noted dependently image 87 measuring 4 mm.  Colonic diverticuli noted without evidence for diverticulitis. Small retroperitoneal nodes are identified, largest adjacent to the left common iliac chain measuring 5 mm image 69.  Mild lumbar spine disc degenerative change is noted. No lytic or sclerotic osseous lesion is identified.  IMPRESSION: 4.9 cm left pelvic sidewall mass arising from or invading the adjacent bladder (possibly within a previously seen bladder diverticulum), with bladder wall thickening identified and a dependent bladder calculus noted. This is highly suspicious for bladder carcinoma recurrence. Cystitis is felt less likely.  5 mm dominant left common iliac chain lymph node which is suspicious for nodal metastatic disease.  Right lateral bladder wall diverticulum. 6 mm proximal right ureteral calculus without hydroureteronephrosis.  Bilateral nonobstructing renal calculi.  These results were called by telephone at the time of interpretation on 11/02/2013 at 1:04 pm to Dr. Jinny Blossom, [facility] , who verbally acknowledged these results.   Electronically Signed   By: Conchita Paris M.D.   On: 11/02/2013 13:09   Dg Chest 2 View  11/02/2013   CLINICAL DATA:  Fever  EXAM: CHEST  2 VIEW  COMPARISON:  None.  FINDINGS: The heart size and mediastinal contours are within normal limits. Both lungs are clear. The visualized skeletal structures are unremarkable. Minimal biapical pleural thickening.  IMPRESSION: No active cardiopulmonary disease.   Electronically Signed   By: Conchita Paris M.D.   On: 11/02/2013 12:42  Results for orders placed during the hospital encounter of 11/03/13  CULTURE, BLOOD (ROUTINE X 2)      Result Value Ref Range   Specimen Description BLOOD LEFT HAND     Special Requests       Value: BOTTLES DRAWN AEROBIC AND ANAEROBIC 5 CC PATIENT ON FOLLOWING KEFLEX AND CIPRO   Culture  Setup Time       Value: 11/03/2013 15:05     Performed at [facility]    Culture       Value:        BLOOD CULTURE RECEIVED NO GROWTH TO DATE CULTURE WILL BE HELD FOR 5 DAYS BEFORE ISSUING A FINAL NEGATIVE REPORT     Performed at [facility]   Report Status PENDING    CULTURE, BLOOD (ROUTINE X 2)      Result Value Ref Range   Specimen Description BLOOD RIGHT HAND     Special Requests       Value: BOTTLES DRAWN AEROBIC AND ANAEROBIC 3 CC EACH PATIENT ON FOLLOWING KEFLEX AND CIPRO   Culture  Setup Time       Value: 11/03/2013 15:06     Performed at [facility]   Culture       Value:        BLOOD CULTURE RECEIVED NO GROWTH TO DATE CULTURE WILL BE HELD FOR 5 DAYS BEFORE ISSUING A FINAL NEGATIVE REPORT     Performed at [facility]   Report Status PENDING    CBC WITH DIFFERENTIAL      Result Value Ref Range   WBC 9.6  4.0 - 10.5 K/uL   RBC 4.58  4.22 - 5.81 MIL/uL   Hemoglobin 13.3  13.0 - 17.0 g/dL   HCT 38.7 (*) 39.0 - 52.0 %   MCV 84.5  78.0 - 100.0 fL   MCH 29.0  26.0 - 34.0 pg   MCHC 34.4  30.0 - 36.0 g/dL   RDW 11.9  11.5 - 15.5 %   Platelets 143 (*) 150 - 400 K/uL   Neutrophils Relative % 98 (*) 43 - 77 %   Lymphocytes Relative 1 (*) 12 - 46 %   Monocytes Relative 1 (*) 3 - 12 %   Eosinophils Relative 0  0 - 5 %   Basophils Relative 0  0 - 1 %   Band Neutrophils 0  0 - 10 %   Metamyelocytes Relative 0     Myelocytes 0     Promyelocytes Absolute 0     Blasts 0     nRBC 0  0 /100 WBC   Neutro Abs 9.4 (*) 1.7 - 7.7 K/uL   Lymphs Abs 0.1 (*) 0.7 - 4.0 K/uL   Monocytes Absolute 0.1  0.1 - 1.0 K/uL   Eosinophils Absolute 0.0  0.0 - 0.7 K/uL   Basophils Absolute 0.0  0.0 - 0.1 K/uL   WBC Morphology TOXIC GRANULATION    BASIC METABOLIC PANEL      Result Value Ref Range   Sodium 139  137 - 147 mEq/L   Potassium 3.5 (*) 3.7 - 5.3 mEq/L   Chloride 103  96 - 112 mEq/L   CO2 19  19 - 32 mEq/L   Glucose, Bld 106 (*) 70 - 99 mg/dL   BUN 20  6 - 23 mg/dL   Creatinine, Ser 0.82  0.50 - 1.35 mg/dL   Calcium 10.5  8.4 -  10.5 mg/dL   GFR  calc non Af Amer >90  >90 mL/min   GFR calc Af Amer >90  >90 mL/min   Anion gap 17 (*) 5 - 15  URINALYSIS, ROUTINE W REFLEX MICROSCOPIC      Result Value Ref Range   Color, Urine RED (*) YELLOW   APPearance CLOUDY (*) CLEAR   Specific Gravity, Urine 1.030  1.005 - 1.030   pH 6.0  5.0 - 8.0   Glucose, UA NEGATIVE  NEGATIVE mg/dL   Hgb urine dipstick LARGE (*) NEGATIVE   Bilirubin Urine MODERATE (*) NEGATIVE   Ketones, ur 15 (*) NEGATIVE mg/dL   Protein, ur 100 (*) NEGATIVE mg/dL   Urobilinogen, UA 1.0  0.0 - 1.0 mg/dL   Nitrite NEGATIVE  NEGATIVE   Leukocytes, UA SMALL (*) NEGATIVE  URINE MICROSCOPIC-ADD ON      Result Value Ref Range   WBC, UA 0-2  <3 WBC/hpf   RBC / HPF TOO NUMEROUS TO COUNT  <3 RBC/hpf   Urine-Other MUCOUS PRESENT    HEPATIC FUNCTION PANEL      Result Value Ref Range   Total Protein 6.5  6.0 - 8.3 g/dL   Albumin 2.6 (*) 3.5 - 5.2 g/dL   AST 65 (*) 0 - 37 U/L   ALT 44  0 - 53 U/L   Alkaline Phosphatase 148 (*) 39 - 117 U/L   Total Bilirubin 1.7 (*) 0.3 - 1.2 mg/dL   Bilirubin, Direct 0.9 (*) 0.0 - 0.3 mg/dL   Indirect Bilirubin 0.8  0.3 - 0.9 mg/dL  BASIC METABOLIC PANEL      Result Value Ref Range   Sodium 137  137 - 147 mEq/L   Potassium 4.0  3.7 - 5.3 mEq/L   Chloride 104  96 - 112 mEq/L   CO2 24  19 - 32 mEq/L   Glucose, Bld 290 (*) 70 - 99 mg/dL   BUN 19  6 - 23 mg/dL   Creatinine, Ser 0.78  0.50 - 1.35 mg/dL   Calcium 9.9  8.4 - 10.5 mg/dL   GFR calc non Af Amer >90  >90 mL/min   GFR calc Af Amer >90  >90 mL/min   Anion gap 9  5 - 15  CBC      Result Value Ref Range   WBC 8.6  4.0 - 10.5 K/uL   RBC 4.23  4.22 - 5.81 MIL/uL   Hemoglobin 12.4 (*) 13.0 - 17.0 g/dL   HCT 36.7 (*) 39.0 - 52.0 %   MCV 86.8  78.0 - 100.0 fL   MCH 29.3  26.0 - 34.0 pg   MCHC 33.8  30.0 - 36.0 g/dL   RDW 12.2  11.5 - 15.5 %   Platelets 134 (*) 150 - 400 K/uL   A/P: 63 yo male with remote hx of NHL and 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 cystic mass (non-tender). Multiple negative urine cultures over past week. He is POD#1 from cysto, R stent placement with no purulence behind stone. Also with foley irritation, and submucosal lesion on L lateral wall of bladder. Recent CT revealed low-density collection in the lateral aspect of the left bladder wall extending to the pelvic sidewall with differential including cystic wall abscess versus bladder cancer recurrence. Plan is for CT guided aspiration/possible  drainage of left pelvic fluid collection for cytology/cx on 8/5. Details/risks of procedure d/w pt/wife with their understanding and consent.

## 2013-11-04 NOTE — Progress Notes (Signed)
I agree with the above noted. Patient was personally seen and examined today. I discussed the patient with Dr. Glo Herring. The patient feels better. He underwent CT scan of the abdomen and pelvis and this shows a complex fluid collection in the left pelvis. Looking back to the 2014 scan it is now evident that the left side that is a small lymphocele and not a diverticulum. There is no breaking the bladder mucosa and on cystoscopy yesterday there is no connection to this left fluid collection. This more likely makes this an infected lymphocele but the patient clinically is stable and afebrile. Subjectively improved. Certainly an infected lymphocele adjacent to the bladder could give him irritative voiding symptoms and fever. We're going to review the CT scan with interventional for potential per drainage of this fluid collection. I discussed the above with the patient and his family.

## 2013-11-04 NOTE — Progress Notes (Addendum)
[facility] PROGRESS NOTE  KUE FOX VQQ:[id number] DOB: 1950-11-12 DOA: 11/03/2013 PCP: Sherrie Mustache, MD  Brief Summary  Justin Huber is an 63 y.o. 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 He was first evaluated for hematuria while traveling in [place], underwent a cystoscopy and resection of a bladder mass while there, but pathology was benign. He subsequently followed up with Dr. Janice Norrie with quarterly cystoscopies to evaluate for tumor recurrence. About a year later, Dr. Janice Norrie found a recurrence in a small diverticula, underwent a laparoscopic surgery, but the area could not be found, so he was referred to [facility] for diverticulectomy (had 4 taken out), and the pathology was benign on those as well. About a week ago, the patient developed dysuria and went to an [facility] where he was placed on a seven-day course of Cipro. Cultures were ultimately negative, and despite treatment with antibiotics, the patient developed fever and rigors 2 days ago. He presented to the ER 11/02/13, and was found to have urinary retention after a bladder scan revealed 560 cc of urine in his bladder. He was discharged home with a Foley catheter. A CT scan done at the time showed a 5 mm bright UPJ stone and a 5 cm mass invading the bladder wall on the left. He was advised to followup with Dr. Janice Norrie. When he called urology office, he was told Dr. Janice Norrie was on vacation, and advised to come back to the ED.  He was having rigors and fevers by the time he returned to the ER.  He underwent cystourethroscopy, right ureteral stent placement, right retrograde pyelogram, removal of stone on 8/3 by Drs. Glo Herring and [employer]  There was a narrow mouthed diverticulum at the 10:00 position and  there were some mucosal lesions on the bladder wall. On the left bladder wall there was a broad-based sleeping of mucosa that was erythematous but appeared to be an infiltrative extravesicular lesion and a similar lesion at the 2:00 position. These were not biopsied. The patient underwent CT scan of abdomen and pelvis with IV contrast which demonstrated a low density collection in the lateral aspect of the left bladder wall extending to the pelvic sidewall suggestive of cystic wall abscess versus recurrent bladder cancer.  He is going to undergo aspiration of the complex area by IR on 8/5.   Assessment/Plan  Severe sepsis with hypotension, fevers, initially thought to be due to UTI, however, only 0-2 WBC on UA, culture from 8/2 negative and there was no purulence when stone was removed by urology.  CXR negative, however, I hear rales at the right base today - maybe his dehydration prevented [place] from seeing PNA on initial CXR.  Concern that source may be complex fluid collection near bladder.  Fevers resolving with ceftriaxone, however, blood pressures still low normal.  -  IR to aspirate fluid on 8/5 -  Continue ceftriaxone -  BCx NGTD -  Appreciate urology assistance -  Appreciate IR assistance -  Continue to hold HCTZ -  D/c ARB -  Repeat PA/lateral CXR  Bladder mass/ Pelvic mass, as above  Hypokalemia, resolved with IV supplementation -  D/c IVF  Normocytic anemia, likely hemodilutional -  Repeat in AM  Thrombocytopenia, partly hemodilutional and appears to be nadiring.  Acute phase reactant.  On lovenox, not heparin.  Hyperglycemia -  Check A1c -  Start SSI  Diet:  regular Access:  PIV IVF:  off Proph:  SCDs  Code Status: full Family Communication: patient and wife Disposition Plan:  Continue eval for source of sepsis.  Aspiration of perivesicular fluid collection.     Consultants:  Urology, Dr. Junious Silk  IR  Procedures: -  cystourethroscopy, right ureteral stent  placement, right retrograde pyelogram, removal of stone on 8/3 by Drs. Glo Herring and [place] -  CT abd/pelvis WO contrast -  CT abd/pelvis W contrast  -  CXR  Antibiotics:  Ceftriaxone 8/3 >>  HPI/Subjective:  Some cough prior to admission.  Denies nausea, vomiting, abdominal pain.  Rigors resolving.   Objective: Filed Vitals:   11/04/13 0201 11/04/13 0219 11/04/13 0517 11/04/13 1009  BP: 83/46 112/62 91/64 101/60  Pulse: 71  64 67  Temp: 97.5 F (36.4 C)  97.4 F (36.3 C) 97.6 F (36.4 C)  TempSrc: Oral  Oral Oral  Resp: 20  20 18   Height:      Weight:      SpO2: 93%  97% 95%    Intake/Output Summary (Last 24 hours) at 11/04/13 1555 Last data filed at 11/04/13 1540  Gross per 24 hour  Intake    700 ml  Output   1475 ml  Net   -775 ml   Filed Weights   11/03/13 1438  Weight: 98.88 kg (217 lb 15.9 oz)    Exam:   General:  WM, No acute distress  HEENT:  NCAT, MMM  Cardiovascular:  RRR, nl S1, S2 no mrg, 2+ pulses, warm extremities  Respiratory:  Persistent rales at the right base, no wheezes or rhonchi, no increased WOB  Abdomen:   NABS, soft, NT/ND  MSK:   Normal tone and bulk, no LEE  Neuro:  Grossly intact  Data Reviewed: Basic Metabolic Panel:  Recent Labs Lab 11/02/13 0721 11/03/13 1039 11/04/13 0402  NA 136* 139 137  K 2.8* 3.5* 4.0  CL 97 103 104  CO2 24 19 24   GLUCOSE 137* 106* 290*  BUN 12 20 19   CREATININE 0.72 0.82 0.78  CALCIUM 10.9* 10.5 9.9   Liver Function Tests:  Recent Labs Lab 11/03/13 1456  AST 65*  ALT 44  ALKPHOS 148*  BILITOT 1.7*  PROT 6.5  ALBUMIN 2.6*   No results found for this basename: LIPASE, AMYLASE,  in the last 168 hours No results found for this basename: AMMONIA,  in the last 168 hours CBC:  Recent Labs Lab 11/02/13 0721 11/03/13 1039 11/04/13 0402  WBC 6.4 9.6 8.6  NEUTROABS 6.2 9.4*  --   HGB 13.5 13.3 12.4*  HCT 39.1 38.7* 36.7*  MCV 85.6 84.5 86.8  PLT 197 143* 134*   Cardiac  Enzymes: No results found for this basename: CKTOTAL, CKMB, CKMBINDEX, TROPONINI,  in the last 168 hours BNP (last 3 results) No results found for this basename: PROBNP,  in the last 8760 hours CBG: No results found for this basename: GLUCAP,  in the last 168 hours  Recent Results (from the past 240 hour(s))  URINE CULTURE     Status: None   Collection Time    11/02/13  7:01 AM      Result Value Ref Range Status   Specimen Description URINE, CATHETERIZED   Final   Special Requests NONE   Final   Culture  Setup Time     Final  Value: 11/02/2013 09:55     Performed at [place]     Final   Value: NO GROWTH     Performed at [facility]   Culture     Final   Value: NO GROWTH     Performed at [facility]   Report Status 11/03/2013 FINAL   Final  CULTURE, BLOOD (ROUTINE X 2)     Status: None   Collection Time    11/03/13 10:39 AM      Result Value Ref Range Status   Specimen Description BLOOD LEFT HAND   Final   Special Requests     Final   Value: BOTTLES DRAWN AEROBIC AND ANAEROBIC 5 CC PATIENT ON FOLLOWING KEFLEX AND CIPRO   Culture  Setup Time     Final   Value: 11/03/2013 15:05     Performed at [facility]   Culture     Final   Value:        BLOOD CULTURE RECEIVED NO GROWTH TO DATE CULTURE WILL BE HELD FOR 5 DAYS BEFORE ISSUING A FINAL NEGATIVE REPORT     Performed at [facility]   Report Status PENDING   Incomplete  CULTURE, BLOOD (ROUTINE X 2)     Status: None   Collection Time    11/03/13 10:39 AM      Result Value Ref Range Status   Specimen Description BLOOD RIGHT HAND   Final   Special Requests     Final   Value: BOTTLES DRAWN AEROBIC AND ANAEROBIC 3 CC EACH PATIENT ON FOLLOWING KEFLEX AND CIPRO   Culture  Setup Time     Final   Value: 11/03/2013 15:06     Performed at [facility]   Culture     Final   Value:        BLOOD CULTURE RECEIVED NO GROWTH TO DATE CULTURE WILL BE HELD FOR 5 DAYS  BEFORE ISSUING A FINAL NEGATIVE REPORT     Performed at [facility]   Report Status PENDING   Incomplete     Studies: Ct Abdomen Pelvis W Wo Contrast  11/04/2013   CLINICAL DATA:  Evaluate bladder and pelvic mass. Status post stone extraction right stent placement.  EXAM: CT ABDOMEN AND PELVIS WITHOUT AND WITH CONTRAST  TECHNIQUE: Multidetector CT imaging of the abdomen and pelvis was performed following the standard protocol before and following the bolus administration of intravenous contrast.  CONTRAST:  184mL OMNIPAQUE IOHEXOL 300 MG/ML  SOLN  COMPARISON:  CT 11/02/2013  FINDINGS: Lung bases are clear. There are small bilateral pleural effusions. There is a low-density lesion along the cardiac border at the level of the right atrium which is not changed from CT of 01/09/2011 measuring 4.7 by 1.6 cm - image 1.  No focal hepatic lesion. The gallbladder, pancreas, spleen, adrenal glands are normal.  Renal: Noncontrast exam demonstrates several sub 5 mm calculi within the left and right kidney. There is interval extraction the calculus in the right renal pelvis. There is interval placement of a right ureteral stent. No hydronephrosis. The cortical phase imaging demonstrates no enhancing renal cortical lesion. There is a high-density nonenhancing cyst extending from the lower pole of the left kidney.  Delayed pyelogram phase imaging demonstrates no filling defects within the collecting system and ureters. No evidence of obstruction.  In the pelvis, there is a Foley catheter within the bladder. Within the left lateral wall of the  bladder there is a a 5.0 x 3.8 cm low-density collection which extends from the bladder wall to the left pelvic sidewall has simple fluid attenuation a thin enhancing rim (image 86, series 4). Delayed imaging demonstrates a large bladder diverticulum on the right.  There is no pelvic lymphadenopathy. Rectum is normal. Abdominal aorta is normal. No retroperitoneal  lymphadenopathy.  IMPRESSION: 1. Interval extraction of right renal pelvic calculus and placement of a right ureteral stent. 2. No evidence of hydronephrosis or obstruction. Bilateral nephrolithiasis. 3. Low-density collection in the lateral aspect of the left bladder wall extending to the pelvic sidewall with differential including cystic wall abscess versus bladder cancer recurrence. 4. 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   On: 11/04/2013 10:38    Scheduled Meds: . aspirin EC  81 mg Oral QPM  . atorvastatin  10 mg Oral QPM  . cefTRIAXone (ROCEPHIN)  IV  1 g Intravenous Q24H  . enoxaparin (LOVENOX) injection  40 mg Subcutaneous Q24H  . irbesartan  150 mg Oral Daily  . tamsulosin  0.4 mg Oral Daily   Continuous Infusions: . 0.9 % NaCl with KCl 20 mEq / L 100 mL/hr at 11/04/13 7915    Principal Problem:   Sepsis Active Problems:   Fever   Bladder mass   Rigors   Pelvic mass   Renal calculi   Hypotension (history of hypertension)   Hypokalemia    Time spent: 30 min    Nochum Fenter, [facility] Pager [phone]. If 7PM-7AM, please contact night-coverage at [email or web address], password [facility] 11/04/2013, 3:55 PM  LOS: 1 day

## 2013-11-04 NOTE — ED Provider Notes (Signed)
Medical screening examination/treatment/procedure(s) were performed by non-physician practitioner and as supervising physician I was immediately available for consultation/collaboration.   EKG Interpretation None        Neta Ehlers, MD 11/04/13 1351

## 2013-11-04 NOTE — Care Management Note (Signed)
    Page 1 of 1   11/04/2013     4:25:52 PM CARE MANAGEMENT NOTE 11/04/2013  Patient:  Justin Huber, Justin Huber   Account Number:  [id number]  Date Initiated:  11/04/2013  Documentation initiated by:  Dessa Phi  Subjective/Objective Assessment:   63 Y/O M ADMITTED W/SEPSIS.     Action/Plan:   FROM HOME.   Anticipated DC Date:  11/07/2013   Anticipated DC Plan:  [place]  CM consult      Choice offered to / List presented to:             Status of service:  In process, will continue to follow Medicare Important Message given?   (If response is "NO", the following Medicare IM given date fields will be blank) Date Medicare IM given:   Medicare IM given by:   Date Additional Medicare IM given:   Additional Medicare IM given by:    Discharge Disposition:    Per UR Regulation:  Reviewed for med. necessity/level of care/duration of stay  If discussed at Long Length of Stay Meetings, dates discussed:    Comments:  11/04/13 Venba Zenner RN,BSN NCM [phone] NO ANTICIPATED D/C NEEDS.

## 2013-11-05 ENCOUNTER — Encounter (HOSPITAL_COMMUNITY): Payer: Self-pay | Admitting: Radiology

## 2013-11-05 ENCOUNTER — Inpatient Hospital Stay (HOSPITAL_COMMUNITY): Payer: Medicare HMO

## 2013-11-05 LAB — CBC
HCT: 36.4 % — ABNORMAL LOW (ref 39.0–52.0)
Hemoglobin: 12.5 g/dL — ABNORMAL LOW (ref 13.0–17.0)
MCH: 29 pg (ref 26.0–34.0)
MCHC: 34.3 g/dL (ref 30.0–36.0)
MCV: 84.5 fL (ref 78.0–100.0)
Platelets: 127 10*3/uL — ABNORMAL LOW (ref 150–400)
RBC: 4.31 MIL/uL (ref 4.22–5.81)
RDW: 12.1 % (ref 11.5–15.5)
WBC: 12.3 10*3/uL — ABNORMAL HIGH (ref 4.0–10.5)

## 2013-11-05 LAB — BASIC METABOLIC PANEL
Anion gap: 9 (ref 5–15)
BUN: 16 mg/dL (ref 6–23)
CO2: 23 mEq/L (ref 19–32)
Calcium: 10.4 mg/dL (ref 8.4–10.5)
Chloride: 106 mEq/L (ref 96–112)
Creatinine, Ser: 0.64 mg/dL (ref 0.50–1.35)
GFR calc Af Amer: >90 mL/min (ref >90)
GFR calc non Af Amer: >90 mL/min (ref >90)
Glucose, Bld: 173 mg/dL — ABNORMAL HIGH (ref 70–99)
Potassium: 3.8 mEq/L (ref 3.7–5.3)
Sodium: 138 mEq/L (ref 137–147)

## 2013-11-05 LAB — CREATININE, FLUID (PLEURAL, PERITONEAL, JP DRAINAGE): CREAT FL: 0.4 mg/dL

## 2013-11-05 LAB — GLUCOSE, CAPILLARY
GLUCOSE-CAPILLARY: 140 mg/dL — AB (ref 70–99)
Glucose-Capillary: 133 mg/dL — ABNORMAL HIGH (ref 70–99)
Glucose-Capillary: 159 mg/dL — ABNORMAL HIGH (ref 70–99)
Glucose-Capillary: 105 mg/dL — ABNORMAL HIGH (ref 70–99)

## 2013-11-05 LAB — APTT: aPTT: 24 seconds (ref 24–37)

## 2013-11-05 LAB — PROTIME-INR
INR: 1.04 (ref 0.00–1.49)
Prothrombin Time: 13.6 seconds (ref 11.6–15.2)

## 2013-11-05 LAB — HEMOGLOBIN A1C
Hgb A1c MFr Bld: 5.7 % — ABNORMAL HIGH (ref <5.7)
MEAN PLASMA GLUCOSE: 117 mg/dL — AB (ref <117)

## 2013-11-05 MED ORDER — FENTANYL CITRATE 0.05 MG/ML IJ SOLN
INTRAMUSCULAR | Status: AC
  Filled 2013-11-05: qty 6

## 2013-11-05 MED ORDER — ENOXAPARIN SODIUM 40 MG/0.4ML ~~LOC~~ SOLN
40.0000 mg | SUBCUTANEOUS | Status: DC
  Administered 2013-11-05 – 2013-11-06 (×2): 40 mg via SUBCUTANEOUS
  Filled 2013-11-05 (×4): qty 0.4

## 2013-11-05 MED ORDER — FENTANYL CITRATE 0.05 MG/ML IJ SOLN
INTRAMUSCULAR | Status: AC | PRN
  Administered 2013-11-05 (×2): 50 ug via INTRAVENOUS

## 2013-11-05 MED ORDER — MIDAZOLAM HCL 2 MG/2ML IJ SOLN
INTRAMUSCULAR | Status: AC | PRN
  Administered 2013-11-05 (×4): 1 mg via INTRAVENOUS

## 2013-11-05 MED ORDER — MIDAZOLAM HCL 2 MG/2ML IJ SOLN
INTRAMUSCULAR | Status: AC
  Filled 2013-11-05: qty 6

## 2013-11-05 NOTE — Procedures (Signed)
Technically successful CT guided drainage catheter placement into left lower abdominal quadrant yielding 15 cc of purulent material.  Samples sent to laboratory, including for cytologic analysis.  No immediate post procedural complications.

## 2013-11-05 NOTE — Progress Notes (Signed)
[facility] PROGRESS NOTE  Justin Huber RXV:[id number] DOB: 03/06/1951 DOA: 11/03/2013 PCP: Sherrie Mustache, MD    Assessment/Plan  Severe sepsis with hypotension, fevers, initially thought to be due to UTI, however, only 0-2 WBC on UA, culture from 8/2 negative and there was no purulence when stone was removed by urology.  CXR negative, however, I hear rales at the right base today - maybe his dehydration prevented [place] from seeing PNA on initial CXR.  Concern that source may be complex fluid collection near bladder.  Fevers resolving with ceftriaxone, however, blood pressures still low normal.  -  IR to aspirate fluid on 8/5 - Continue current antibiotic regimen.  Bladder mass/ Pelvic mass - IR draining. Not thought to be a mass but rather an abscess   Hypokalemia, resolved with IV supplementation -  D/c IVF  Normocytic anemia, likely hemodilutional -  Stable, will continue to monitor.  Thrombocytopenia - Acute phase reactant.  On lovenox, not heparin.  Hyperglycemia -  Check A1c -  Start SSI  Diet:  regular Access:  PIV IVF:  off Proph:  SCDs  Code Status: full Family Communication: patient and wife Disposition Plan:  Awaiting cultures.  Aspiration of perivesicular fluid collection.     Consultants:  Urology, Dr. Junious Silk  IR  Procedures: -  cystourethroscopy, right ureteral stent placement, right retrograde pyelogram, removal of stone on 8/3 by Drs. Glo Herring and Eskridge -  CT abd/pelvis WO contrast -  CT abd/pelvis W contrast  -  CXR  Antibiotics:  Ceftriaxone 8/3 >>  HPI/Subjective:  No new complaints. No acute issues overnight.  Objective: Filed Vitals:   11/05/13 1030 11/05/13 1142 11/05/13 1206 11/05/13 1418  BP: 114/72 118/64 117/62 116/63  Pulse: 78 92 90 81  Temp: 98 F (36.7 C) 98.1 F (36.7 C)  98.2 F (36.8 C)  TempSrc:  Oral  Oral  Resp: 15 20 18 16   Height:      Weight:      SpO2:    97%    Intake/Output Summary  (Last 24 hours) at 11/05/13 1758 Last data filed at 11/05/13 1756  Gross per 24 hour  Intake    840 ml  Output    875 ml  Net    -35 ml   Filed Weights   11/03/13 1438  Weight: 98.88 kg (217 lb 15.9 oz)    Exam:  General: Alert, awake, oriented x3, in no acute distress. HEENT: No bruits, no goiter. Heart: Regular rate and rhythm, without murmurs, rubs, gallops. Lungs: Clear to auscultation bilaterally., no wheezes Abdomen: Soft, nontender, nondistended, positive bowel sounds. Drain in place and draining sero sanquinous fluid Extremities: No clubbing cyanosis or edema with positive pedal pulses. Neuro: Grossly intact, nonfocal.  Data Reviewed: Basic Metabolic Panel:  Recent Labs Lab 11/02/13 0721 11/03/13 1039 11/04/13 0402 11/05/13 0350  NA 136* 139 137 138  K 2.8* 3.5* 4.0 3.8  CL 97 103 104 106  CO2 24 19 24 23   GLUCOSE 137* 106* 290* 173*  BUN 12 20 19 16   CREATININE 0.72 0.82 0.78 0.64  CALCIUM 10.9* 10.5 9.9 10.4   Liver Function Tests:  Recent Labs Lab 11/03/13 1456  AST 65*  ALT 44  ALKPHOS 148*  BILITOT 1.7*  PROT 6.5  ALBUMIN 2.6*   No results found for this basename: LIPASE, AMYLASE,  in the last 168 hours No results found for this basename: AMMONIA,  in the last 168 hours CBC:  Recent Labs Lab 11/02/13  6789 11/03/13 1039 11/04/13 0402 11/05/13 0350  WBC 6.4 9.6 8.6 12.3*  NEUTROABS 6.2 9.4*  --   --   HGB 13.5 13.3 12.4* 12.5*  HCT 39.1 38.7* 36.7* 36.4*  MCV 85.6 84.5 86.8 84.5  PLT 197 143* 134* 127*   Cardiac Enzymes: No results found for this basename: CKTOTAL, CKMB, CKMBINDEX, TROPONINI,  in the last 168 hours BNP (last 3 results) No results found for this basename: PROBNP,  in the last 8760 hours CBG:  Recent Labs Lab 11/04/13 1745 11/04/13 2107 11/05/13 0730 11/05/13 1157 11/05/13 1659  GLUCAP 204* 214* 133* 140* 159*    Recent Results (from the past 240 hour(s))  URINE CULTURE     Status: None   Collection Time     11/02/13  7:01 AM      Result Value Ref Range Status   Specimen Description URINE, CATHETERIZED   Final   Special Requests NONE   Final   Culture  Setup Time     Final   Value: 11/02/2013 09:55     Performed at [facility] Count     Final   Value: NO GROWTH     Performed at [facility]   Culture     Final   Value: NO GROWTH     Performed at [facility]   Report Status 11/03/2013 FINAL   Final  CULTURE, BLOOD (ROUTINE X 2)     Status: None   Collection Time    11/03/13 10:39 AM      Result Value Ref Range Status   Specimen Description BLOOD LEFT HAND   Final   Special Requests     Final   Value: BOTTLES DRAWN AEROBIC AND ANAEROBIC 5 CC PATIENT ON FOLLOWING KEFLEX AND CIPRO   Culture  Setup Time     Final   Value: 11/03/2013 15:05     Performed at [facility]   Culture     Final   Value:        BLOOD CULTURE RECEIVED NO GROWTH TO DATE CULTURE WILL BE HELD FOR 5 DAYS BEFORE ISSUING A FINAL NEGATIVE REPORT     Performed at [facility]   Report Status PENDING   Incomplete  CULTURE, BLOOD (ROUTINE X 2)     Status: None   Collection Time    11/03/13 10:39 AM      Result Value Ref Range Status   Specimen Description BLOOD RIGHT HAND   Final   Special Requests     Final   Value: BOTTLES DRAWN AEROBIC AND ANAEROBIC 3 CC EACH PATIENT ON FOLLOWING KEFLEX AND CIPRO   Culture  Setup Time     Final   Value: 11/03/2013 15:06     Performed at [facility]   Culture     Final   Value:        BLOOD CULTURE RECEIVED NO GROWTH TO DATE CULTURE WILL BE HELD FOR 5 DAYS BEFORE ISSUING A FINAL NEGATIVE REPORT     Performed at [facility]   Report Status PENDING   Incomplete     Studies: Ct Abdomen Pelvis W Wo Contrast  11/04/2013   CLINICAL DATA:  Evaluate bladder and pelvic mass. Status post stone extraction right stent placement.  EXAM: CT ABDOMEN AND PELVIS WITHOUT AND WITH CONTRAST  TECHNIQUE: Multidetector CT imaging  of the abdomen and pelvis was performed following the standard protocol before and following the bolus  administration of intravenous contrast.  CONTRAST:  148mL OMNIPAQUE IOHEXOL 300 MG/ML  SOLN  COMPARISON:  CT 11/02/2013  FINDINGS: Lung bases are clear. There are small bilateral pleural effusions. There is a low-density lesion along the cardiac border at the level of the right atrium which is not changed from CT of 01/09/2011 measuring 4.7 by 1.6 cm - image 1.  No focal hepatic lesion. The gallbladder, pancreas, spleen, adrenal glands are normal.  Renal: Noncontrast exam demonstrates several sub 5 mm calculi within the left and right kidney. There is interval extraction the calculus in the right renal pelvis. There is interval placement of a right ureteral stent. No hydronephrosis. The cortical phase imaging demonstrates no enhancing renal cortical lesion. There is a high-density nonenhancing cyst extending from the lower pole of the left kidney.  Delayed pyelogram phase imaging demonstrates no filling defects within the collecting system and ureters. No evidence of obstruction.  In the pelvis, there is a Foley catheter within the bladder. Within the left lateral wall of the bladder there is a a 5.0 x 3.8 cm low-density collection which extends from the bladder wall to the left pelvic sidewall has simple fluid attenuation a thin enhancing rim (image 86, series 4). Delayed imaging demonstrates a large bladder diverticulum on the right.  There is no pelvic lymphadenopathy. Rectum is normal. Abdominal aorta is normal. No retroperitoneal lymphadenopathy.  IMPRESSION: 1. Interval extraction of right renal pelvic calculus and placement of a right ureteral stent. 2. No evidence of hydronephrosis or obstruction. Bilateral nephrolithiasis. 3. Low-density collection in the lateral aspect of the left bladder wall extending to the pelvic sidewall with differential including cystic wall abscess versus bladder cancer  recurrence. 4. 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   On: 11/04/2013 10:38   Dg Chest 2 View  11/04/2013   CLINICAL DATA:  Abnormal breath sounds right base  EXAM: CHEST  2 VIEW  COMPARISON:  11/02/2013  FINDINGS: Heart size is upper normal but stable. Vascular pattern is normal. There is mild subsegmental atelectasis at both lung bases, new from the prior study. There is a tiny right pleural effusion which is also new.  IMPRESSION: Tiny right pleural effusion. Mild subsegmental atelectasis at the bilateral lung bases.   Electronically Signed   By: Skipper Cliche M.D.   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 Please perform CT-guided aspiration and/or drainage catheter placement for treatment as well as diagnostic purposes. Please Send aspirated sample for cytologic analysis.  EXAM: CT IMAGE GUIDED DRAINAGE BY PERCUTANEOUS CATHETER  COMPARISON:  CT abdomen pelvis - 11/04/2013  MEDICATIONS: The patient is currently admitted to the hospital and receiving intravenous antibiotics. The antibiotics were administered within an appropriate time frame prior to the initiation of the procedure.  ANESTHESIA/SEDATION: Fentanyl 100 mcg IV; Versed 4 mg IV  Total Moderate Sedation time  20 minutes  CONTRAST:  None  COMPLICATIONS: None immediate  PROCEDURE: Informed written consent was obtained from the patient after a discussion of the risks, benefits and alternatives to treatment. The patient was placed supine on the CT gantry and a pre procedural CT was performed re-demonstrating the known abscess/fluid collection within the left lower abdomen/ upper pelvis,  measuring approximately 4.7 x 4.2 cm (image 25,  series 2). The procedure was planned. A timeout was performed prior to the initiation of the procedure.  The skin overlying the left lower abdomen/pelvis was prepped and draped in the usual sterile fashion. The overlying soft tissues were anesthetized with 1% lidocaine with epinephrine. Appropriate trajectory was planned with the use of a 22 gauge spinal needle. An 18 gauge trocar needle was advanced into the abscess/fluid collection and a short Amplatz super stiff wire was coiled within the collection. Appropriate positioning was confirmed with a limited CT scan. The tract was serially dilated allowing placement of a 10 [place] all-purpose drainage catheter. Appropriate positioning was confirmed with a limited postprocedural CT scan.  Approximately 15 cc of cloudy fluid was aspirated. The tube was connected to a drainage bag and sutured in place. A dressing was placed. The patient tolerated the procedure well without immediate post procedural complication.  IMPRESSION: Successful CT guided placement of a 10 French all purpose drain catheter into the left lower abdomen/pelvis with aspiration of approximately 15 cc of cloudy fluid. Samples were sent to the laboratory as requested by the ordering clinical team. Aspirated sample was also sent for cytologic analysis.   Electronically Signed   By: Sandi Mariscal M.D.   On: 11/05/2013 11:02    Scheduled Meds: . aspirin EC  81 mg Oral Daily  . atorvastatin  10 mg Oral QPM  . cefTRIAXone (ROCEPHIN)  IV  1 g Intravenous Q24H  . enoxaparin (LOVENOX) injection  40 mg Subcutaneous Q24H  . insulin aspart  0-9 Units Subcutaneous TID WC  . tamsulosin  0.4 mg Oral Daily   Continuous Infusions:    Principal Problem:   Severe sepsis Active Problems:   Fever   Bladder mass   Rigors   Pelvic mass   Renal calculi   Hypotension (history of hypertension)   Hypokalemia   Thrombocytopenia, unspecified   Normocytic anemia    Time spent: 30 min    Velvet Bathe  [facility] Pager [phone]. If 7PM-7AM, please contact night-coverage at [email or web address], password [facility] 11/05/2013, 5:58 PM  LOS: 2 days

## 2013-11-05 NOTE — Progress Notes (Signed)
Patient ID: Justin Huber, male   DOB: [birth year], 63 y.o.   MRN: [id number]  His white count went up today so we went ahead with drain placement.  I reviewed the images.  He is resting comfortably now and has no complaints.  On exam there is some light hematuria in the Foley and a fair amount of clear thin fluid in the perc drain.   Impression-infected lymphocele, right proximal ureteral stone-  Plan- -Perc drain fluid Gram stain and culture, cytology pending -I'll send drain fluid for creatinine

## 2013-11-06 ENCOUNTER — Inpatient Hospital Stay (HOSPITAL_COMMUNITY): Payer: Medicare HMO

## 2013-11-06 LAB — BASIC METABOLIC PANEL
Anion gap: 10 (ref 5–15)
BUN: 17 mg/dL (ref 6–23)
CO2: 23 mEq/L (ref 19–32)
CREATININE: 0.65 mg/dL (ref 0.50–1.35)
Calcium: 10 mg/dL (ref 8.4–10.5)
Chloride: 106 mEq/L (ref 96–112)
GFR calc Af Amer: >90 mL/min (ref >90)
Glucose, Bld: 105 mg/dL — ABNORMAL HIGH (ref 70–99)
Potassium: 3.7 mEq/L (ref 3.7–5.3)
Sodium: 139 mEq/L (ref 137–147)

## 2013-11-06 LAB — GLUCOSE, CAPILLARY
Glucose-Capillary: 100 mg/dL — ABNORMAL HIGH (ref 70–99)
Glucose-Capillary: 120 mg/dL — ABNORMAL HIGH (ref 70–99)
Glucose-Capillary: 125 mg/dL — ABNORMAL HIGH (ref 70–99)
Glucose-Capillary: 119 mg/dL — ABNORMAL HIGH (ref 70–99)

## 2013-11-06 MED ORDER — IOHEXOL 300 MG/ML  SOLN
100.0000 mL | Freq: Once | INTRAMUSCULAR | Status: AC | PRN
  Administered 2013-11-06: 100 mL via INTRAVENOUS

## 2013-11-06 NOTE — Progress Notes (Signed)
[facility] PROGRESS NOTE  Justin Huber JHE:[id number] DOB: 11-24-1950 DOA: 11/03/2013 PCP: Sherrie Mustache, MD    Assessment/Plan  Severe sepsis with hypotension, fevers, initially thought to be due to UTI, however, only 0-2 WBC on UA, culture from 8/2 negative and there was no purulence when stone was removed by urology.  CXR negative, however, I hear rales at the right base today - maybe his dehydration prevented [place] from seeing PNA on initial CXR.  Concern that source may be complex fluid collection near bladder.  Fevers resolving with ceftriaxone, however, blood pressures still low normal.  -  IR to aspirated fluid on 8/5, awaiting cultures and cytology currently - Continue current antibiotic regimen.  Bladder mass/ Pelvic mass - IR placed drain. Not thought to be a mass but rather an abscess given aspirated fluid collection  Hypokalemia, resolved with IV supplementation -  D/c IVF  Normocytic anemia, likely hemodilutional -  Stable, will continue to monitor.  Thrombocytopenia - Acute phase reactant.  On lovenox, not heparin. - reassess cbc next am.  Hyperglycemia -  Check A1c -  Start SSI  Diet:  regular Access:  PIV IVF:  off Proph:  SCDs  Code Status: full Family Communication: patient and wife Disposition Plan:  Awaiting cultures and cytology of Aspiration of perivesicular fluid collection.     Consultants:  Urology, Dr. Junious Silk  IR  Procedures: -  cystourethroscopy, right ureteral stent placement, right retrograde pyelogram, removal of stone on 8/3 by Drs. Glo Herring and Eskridge -  CT abd/pelvis WO contrast -  CT abd/pelvis W contrast  -  CXR  Antibiotics:  Ceftriaxone 8/3 >>  HPI/Subjective:  No new complaints. No acute issues overnight.  Objective: Filed Vitals:   11/05/13 1418 11/05/13 2031 11/06/13 0518 11/06/13 1308  BP: 116/63 112/68 127/79 129/70  Pulse: 81 69 66 63  Temp: 98.2 F (36.8 C) 97.7 F (36.5 C) 97.8 F (36.6  C) 98.2 F (36.8 C)  TempSrc: Oral Oral Oral Oral  Resp: 16 18 18 16   Height:      Weight:      SpO2: 97% 98% 98% 97%    Intake/Output Summary (Last 24 hours) at 11/06/13 1539 Last data filed at 11/06/13 1305  Gross per 24 hour  Intake   1320 ml  Output    935 ml  Net    385 ml   Filed Weights   11/03/13 1438  Weight: 98.88 kg (217 lb 15.9 oz)    Exam:  General: Alert, awake, oriented x3, in no acute distress. HEENT: No bruits, no goiter. Heart: Regular rate and rhythm, without murmurs, rubs, gallops. Lungs: Clear to auscultation bilaterally., no wheezes Abdomen: Soft, nontender, nondistended, positive bowel sounds. Drain in place and draining sero sanquinous fluid Extremities: No clubbing cyanosis or edema with positive pedal pulses. Neuro: Grossly intact, nonfocal.  Data Reviewed: Basic Metabolic Panel:  Recent Labs Lab 11/02/13 0721 11/03/13 1039 11/04/13 0402 11/05/13 0350 11/06/13 0350  NA 136* 139 137 138 139  K 2.8* 3.5* 4.0 3.8 3.7  CL 97 103 104 106 106  CO2 24 19 24 23 23   GLUCOSE 137* 106* 290* 173* 105*  BUN 12 20 19 16 17   CREATININE 0.72 0.82 0.78 0.64 0.65  CALCIUM 10.9* 10.5 9.9 10.4 10.0   Liver Function Tests:  Recent Labs Lab 11/03/13 1456  AST 65*  ALT 44  ALKPHOS 148*  BILITOT 1.7*  PROT 6.5  ALBUMIN 2.6*   No results found for this  basename: LIPASE, AMYLASE,  in the last 168 hours No results found for this basename: AMMONIA,  in the last 168 hours CBC:  Recent Labs Lab 11/02/13 0721 11/03/13 1039 11/04/13 0402 11/05/13 0350  WBC 6.4 9.6 8.6 12.3*  NEUTROABS 6.2 9.4*  --   --   HGB 13.5 13.3 12.4* 12.5*  HCT 39.1 38.7* 36.7* 36.4*  MCV 85.6 84.5 86.8 84.5  PLT 197 143* 134* 127*   Cardiac Enzymes: No results found for this basename: CKTOTAL, CKMB, CKMBINDEX, TROPONINI,  in the last 168 hours BNP (last 3 results) No results found for this basename: PROBNP,  in the last 8760 hours CBG:  Recent Labs Lab  11/05/13 1157 11/05/13 1659 11/05/13 2026 11/06/13 0743 11/06/13 1150  GLUCAP 140* 159* 105* 100* 120*    Recent Results (from the past 240 hour(s))  URINE CULTURE     Status: None   Collection Time    11/02/13  7:01 AM      Result Value Ref Range Status   Specimen Description URINE, CATHETERIZED   Final   Special Requests NONE   Final   Culture  Setup Time     Final   Value: 11/02/2013 09:55     Performed at [facility]     Final   Value: NO GROWTH     Performed at [facility]   Culture     Final   Value: NO GROWTH     Performed at [facility]   Report Status 11/03/2013 FINAL   Final  CULTURE, BLOOD (ROUTINE X 2)     Status: None   Collection Time    11/03/13 10:39 AM      Result Value Ref Range Status   Specimen Description BLOOD LEFT HAND   Final   Special Requests     Final   Value: BOTTLES DRAWN AEROBIC AND ANAEROBIC 5 CC PATIENT ON FOLLOWING KEFLEX AND CIPRO   Culture  Setup Time     Final   Value: 11/03/2013 15:05     Performed at [facility]   Culture     Final   Value:        BLOOD CULTURE RECEIVED NO GROWTH TO DATE CULTURE WILL BE HELD FOR 5 DAYS BEFORE ISSUING A FINAL NEGATIVE REPORT     Performed at [facility]   Report Status PENDING   Incomplete  CULTURE, BLOOD (ROUTINE X 2)     Status: None   Collection Time    11/03/13 10:39 AM      Result Value Ref Range Status   Specimen Description BLOOD RIGHT HAND   Final   Special Requests     Final   Value: BOTTLES DRAWN AEROBIC AND ANAEROBIC 3 CC EACH PATIENT ON FOLLOWING KEFLEX AND CIPRO   Culture  Setup Time     Final   Value: 11/03/2013 15:06     Performed at [facility]   Culture     Final   Value:        BLOOD CULTURE RECEIVED NO GROWTH TO DATE CULTURE WILL BE HELD FOR 5 DAYS BEFORE ISSUING A FINAL NEGATIVE REPORT     Performed at [facility]   Report Status PENDING   Incomplete  CULTURE, ROUTINE-ABSCESS     Status: None    Collection Time    11/05/13 10:17 AM      Result Value Ref Range Status   Specimen Description  ABSCESS DRAINAGE   Final   Special Requests Normal   Final   Gram Stain     Final   Value: ABUNDANT WBC PRESENT, PREDOMINANTLY MONONUCLEAR     NO SQUAMOUS EPITHELIAL CELLS SEEN     ABUNDANT GRAM POSITIVE RODS     Performed at [facility]   Culture     Final   Value: NO GROWTH 1 DAY     Performed at [facility]   Report Status PENDING   Incomplete     Studies: Dg Chest 2 View  11/04/2013   CLINICAL DATA:  Abnormal breath sounds right base  EXAM: CHEST  2 VIEW  COMPARISON:  11/02/2013  FINDINGS: Heart size is upper normal but stable. Vascular pattern is normal. There is mild subsegmental atelectasis at both lung bases, new from the prior study. There is a tiny right pleural effusion which is also new.  IMPRESSION: Tiny right pleural effusion. Mild subsegmental atelectasis at the bilateral lung bases.   Electronically Signed   By: Skipper Cliche M.D.   On: 11/04/2013 17:30   Ct Pelvis W Contrast  11/06/2013   CLINICAL DATA:  Gross hematuria following left-sided pelvic drainage catheter placement. Evaluate for bladder injury/urine leak. 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  CONTRAST:  163mL OMNIPAQUE IOHEXOL 300 MG/ML  SOLN  COMPARISON:  CT-guided left-sided pelvic drainage catheter placement - 11/05/2013; CT abdomen pelvis - 11/02/2013; 10/19/2012; 07/11/2006  FINDINGS: The patient has undergone left anterior lower abdominal / pelvic approach percutaneous drainage catheter which is appropriately coiled within the previously demonstrated indeterminate left pelvic sidewall fluid collection  which has minimally decreased in size, now measuring approximately 3.9 x 3.5 cm (image 27, series 2), previously, 4.2 x 4.7 cm. This pelvic fluid collection is again noted to results in mass effect upon the left side of the urinary bladder. There is no passage of excreted contrast (even on the provided delayed images) into this pelvic fluid collection. Trace amount of fluid within the pelvic cul-de-sac. No new definable/drainable fluid collections.  Grossly unchanged size of large, approximately 7.7 x 3.9 x 6.5 cm right-sided urinary bladder diverticulum. Appropriately positioned right-sided double-J ureteral stent with inferior coil within the right lateral aspect of the urinary bladder. The left ureter appears normal throughout its imaged mid and caudal aspects. A Foley catheter is noted within the urinary bladder with a minimal amount of nondependent air. There is diffuse wall thickening involving the urinary bladder, most conspicuously about the left lateral side wall.  Extensive colonic diverticulosis without evidence of diverticulitis.  There is a minimal amount of subcutaneous emphysema within the right lower abdominal wall, presumably at the location of medication administration.  No acute or aggressive osseus abnormalities.  IMPRESSION: 1. Appropriately positioned percutaneous drainage catheter within the indeterminate left pelvic sidewall fluid collection which has minimally decreased in size in the interval. There is no extravasation of excreted urinary contrast (even on the provided delayed images) to suggest a urine leak. 2. Rather diffuse thickening of the urinary bladder wall, most conspicuous about the left lateral wall. Further evaluation could be performed with cystoscopy as clinically indicated. 3. 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 4. Appropriately positioned right-sided double-J ureteral stent. 5. Colonic  diverticulosis without evidence of  diverticulitis.   Electronically Signed   By: Sandi Mariscal M.D.   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 Please perform CT-guided aspiration and/or drainage catheter placement for treatment as well as diagnostic purposes. Please Send aspirated sample for cytologic analysis.  EXAM: CT IMAGE GUIDED DRAINAGE BY PERCUTANEOUS CATHETER  COMPARISON:  CT abdomen pelvis - 11/04/2013  MEDICATIONS: The patient is currently admitted to the hospital and receiving intravenous antibiotics. The antibiotics were administered within an appropriate time frame prior to the initiation of the procedure.  ANESTHESIA/SEDATION: Fentanyl 100 mcg IV; Versed 4 mg IV  Total Moderate Sedation time  20 minutes  CONTRAST:  None  COMPLICATIONS: None immediate  PROCEDURE: Informed written consent was obtained from the patient after a discussion of the risks, benefits and alternatives to treatment. The patient was placed supine on the CT gantry and a pre procedural CT was performed re-demonstrating the known abscess/fluid collection within the left lower abdomen/ upper pelvis, measuring approximately 4.7 x 4.2 cm (image 25, series 2). The procedure was planned. A timeout was performed prior to the initiation of the procedure.  The skin overlying the left lower abdomen/pelvis was prepped and draped in the usual sterile fashion. The overlying soft tissues were anesthetized with 1% lidocaine with epinephrine. Appropriate trajectory was planned with the use of a 22 gauge spinal needle. An 18 gauge trocar needle was advanced into the abscess/fluid collection and a short Amplatz super stiff wire was coiled within the collection. Appropriate positioning was confirmed with a limited CT scan. The tract was serially  dilated allowing placement of a 10 [place] all-purpose drainage catheter. Appropriate positioning was confirmed with a limited postprocedural CT scan.  Approximately 15 cc of cloudy fluid was aspirated. The tube was connected to a drainage bag and sutured in place. A dressing was placed. The patient tolerated the procedure well without immediate post procedural complication.  IMPRESSION: Successful CT guided placement of a 10 French all purpose drain catheter into the left lower abdomen/pelvis with aspiration of approximately 15 cc of cloudy fluid. Samples were sent to the laboratory as requested by the ordering clinical team. Aspirated sample was also sent for cytologic analysis.   Electronically Signed   By: Sandi Mariscal M.D.   On: 11/05/2013 11:02    Scheduled Meds: . aspirin EC  81 mg Oral Daily  . atorvastatin  10 mg Oral QPM  . cefTRIAXone (ROCEPHIN)  IV  1 g Intravenous Q24H  . enoxaparin (LOVENOX) injection  40 mg Subcutaneous Q24H  . insulin aspart  0-9 Units Subcutaneous TID WC  . tamsulosin  0.4 mg Oral Daily   Continuous Infusions:    Principal Problem:   Severe sepsis Active Problems:   Fever   Bladder mass   Rigors   Pelvic mass   Renal calculi   Hypotension (history of hypertension)   Hypokalemia   Thrombocytopenia, unspecified   Normocytic anemia    Time spent: 30 min    Jahlia Omura, [facility] Pager [phone]. If 7PM-7AM, please contact night-coverage at [email or web address], password [facility] 11/06/2013, 3:39 PM  LOS: 3 days

## 2013-11-06 NOTE — Progress Notes (Signed)
3 Days Post-Op  Subjective: Pt noted to have hematuria (via foley) post pelvic drain placement 8/5; denies sig c/o other than minor soreness at drain site Objective: Vital signs in last 24 hours: Temp:  [97.7 F (36.5 C)-98.2 F (36.8 C)] 97.8 F (36.6 C) (08/06 0518) Pulse Rate:  [66-92] 66 (08/06 0518) Resp:  [16-20] 18 (08/06 0518) BP: (112-127)/(62-79) 127/79 mmHg (08/06 0518) SpO2:  [97 %-98 %] 98 % (08/06 0518) Last BM Date: 11/05/13  Intake/Output from previous day: 08/05 0701 - 08/06 0700 In: 840 [P.O.:840] Out: 935 [Urine:900; Drains:35] Intake/Output this shift: Total I/O In: 360 [P.O.:360] Out: 100 [Urine:100]  Left pelvic drain intact, insertion site ok, mildly tender,output 35 cc's turbid, light brown fluid; cx's/cyt pending , fluid creat is not elevated ; drain flushed with sterile NS without difficulty-nonbloody fluid  Lab Results:   Recent Labs  11/04/13 0402 11/05/13 0350  WBC 8.6 12.3*  HGB 12.4* 12.5*  HCT 36.7* 36.4*  PLT 134* 127*   BMET  Recent Labs  11/05/13 0350 11/06/13 0350  NA 138 139  K 3.8 3.7  CL 106 106  CO2 23 23  GLUCOSE 173* 105*  BUN 16 17  CREATININE 0.64 0.65  CALCIUM 10.4 10.0   PT/INR  Recent Labs  11/05/13 0350  LABPROT 13.6  INR 1.04   ABG No results found for this basename: PHART, PCO2, PO2, HCO3,  in the last 72 hours  Studies/Results: Dg Chest 2 View  11/04/2013   CLINICAL DATA:  Abnormal breath sounds right base  EXAM: CHEST  2 VIEW  COMPARISON:  11/02/2013  FINDINGS: Heart size is upper normal but stable. Vascular pattern is normal. There is mild subsegmental atelectasis at both lung bases, new from the prior study. There is a tiny right pleural effusion which is also new.  IMPRESSION: Tiny right pleural effusion. Mild subsegmental atelectasis at the bilateral lung bases.   Electronically Signed   By: Skipper Cliche M.D.   On: 11/04/2013 17:30   Ct Pelvis W Contrast  11/06/2013   CLINICAL DATA:  Gross  hematuria following left-sided pelvic drainage catheter placement. Evaluate for bladder injury/urine leak. 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  CONTRAST:  165mL OMNIPAQUE IOHEXOL 300 MG/ML  SOLN  COMPARISON:  CT-guided left-sided pelvic drainage catheter placement - 11/05/2013; CT abdomen pelvis - 11/02/2013; 10/19/2012; 07/11/2006  FINDINGS: The patient has undergone left anterior lower abdominal / pelvic approach percutaneous drainage catheter which is appropriately coiled within the previously demonstrated indeterminate left pelvic sidewall fluid collection which has minimally decreased in size, now measuring approximately 3.9 x 3.5 cm (image 27, series 2), previously, 4.2 x 4.7 cm. This pelvic fluid collection is again noted to results in mass effect upon the left side of the urinary bladder. There is no passage of excreted contrast (even on the provided delayed images) into this pelvic fluid collection. Trace amount of fluid within the pelvic cul-de-sac. No new definable/drainable fluid collections.  Grossly unchanged size of large, approximately 7.7 x 3.9 x 6.5 cm right-sided urinary bladder diverticulum. Appropriately positioned right-sided double-J ureteral stent with inferior coil within the right lateral aspect of the urinary bladder. The left ureter appears normal throughout its imaged mid and caudal aspects. A Foley catheter  is noted within the urinary bladder with a minimal amount of nondependent air. There is diffuse wall thickening involving the urinary bladder, most conspicuously about the left lateral side wall.  Extensive colonic diverticulosis without evidence of diverticulitis.  There is a  minimal amount of subcutaneous emphysema within the right lower abdominal wall, presumably at the location of medication administration.  No acute or aggressive osseus abnormalities.  IMPRESSION: 1. Appropriately positioned percutaneous drainage catheter within the indeterminate left pelvic sidewall fluid collection which has minimally decreased in size in the interval. There is no extravasation of excreted urinary contrast (even on the provided delayed images) to suggest a urine leak. 2. Rather diffuse thickening of the urinary bladder wall, most conspicuous about the left lateral wall. Further evaluation could be performed with cystoscopy as clinically indicated. 3. 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 4. Appropriately positioned right-sided double-J ureteral stent. 5. Colonic diverticulosis without evidence of diverticulitis.   Electronically Signed   By: Sandi Mariscal M.D.   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 Please perform CT-guided aspiration and/or drainage catheter placement for treatment as well as diagnostic purposes. Please Send aspirated sample for cytologic analysis.  EXAM: CT IMAGE GUIDED DRAINAGE BY PERCUTANEOUS CATHETER  COMPARISON:  CT abdomen pelvis - 11/04/2013  MEDICATIONS: The patient is currently admitted to the hospital and receiving intravenous antibiotics. The antibiotics were administered within an appropriate time frame prior to the initiation of the procedure.  ANESTHESIA/SEDATION: Fentanyl 100 mcg IV; Versed 4 mg IV  Total Moderate Sedation time  20 minutes  CONTRAST:  None  COMPLICATIONS: None immediate  PROCEDURE: Informed written consent was obtained from the patient after a discussion of the  risks, benefits and alternatives to treatment. The patient was placed supine on the CT gantry and a pre procedural CT was performed re-demonstrating the known abscess/fluid collection within the left lower abdomen/ upper pelvis, measuring approximately 4.7 x 4.2 cm (image 25, series 2). The procedure was planned. A timeout was performed prior to the initiation of the procedure.  The skin overlying the left lower abdomen/pelvis was prepped and draped in the usual sterile fashion. The overlying soft tissues were anesthetized with 1% lidocaine with epinephrine. Appropriate trajectory was planned with the use of a 22 gauge spinal needle. An 18 gauge trocar needle was advanced into the abscess/fluid collection and a short Amplatz super stiff wire was coiled within the collection. Appropriate positioning was confirmed with a limited CT scan. The tract was serially dilated allowing placement of a 10 [place] all-purpose drainage catheter. Appropriate positioning was confirmed with a limited postprocedural CT scan.  Approximately 15 cc of cloudy fluid was aspirated. The tube was connected to a drainage bag and sutured in place. A dressing was placed. The patient tolerated the procedure well without immediate post procedural complication.  IMPRESSION: Successful CT guided placement of a 10 French all purpose drain catheter into the left lower abdomen/pelvis with aspiration of approximately 15 cc of cloudy fluid. Samples were sent to the laboratory as requested by the ordering clinical team. Aspirated sample was also sent for cytologic analysis.   Electronically Signed   By: Sandi Mariscal M.D.   On: 11/05/2013 11:02    Anti-infectives: Anti-infectives   Start  Dose/Rate Route Frequency Ordered Stop   11/03/13 1623  levofloxacin (LEVAQUIN) IVPB 500 mg     500 mg 100 mL/hr over 60 Minutes Intravenous 60 min pre-op 11/03/13 1623 11/03/13 1833   11/03/13 1600  cefTRIAXone (ROCEPHIN) 1 g in dextrose 5 % 50 mL IVPB     1  g 100 mL/hr over 30 Minutes Intravenous Every 24 hours 11/03/13 1443     11/03/13 1300  cefTRIAXone (ROCEPHIN) 1 g in dextrose 5 % 50 mL IVPB     1 g 100 mL/hr over 30 Minutes Intravenous  Once 11/03/13 1253 11/03/13 1437      Assessment/Plan: s/p Procedure(s): CYSTOSCOPY WITH RIGHT RETROGRADE PYELOGRAM, RIGHT URETEROSCOPY AND STENT PLACEMENT, Litolipaxy (Right) S/p left pelvic fluid collection drainage 8/5; f/u CT today reveals no sig complication from recent drain placement ( no evid of urine leak or trauma to bladder); thickening of left lateral bladder wall noted; check final cx's/cytology on pelvic fluid; cont drain flushes with sterile NS; other plans as per urology.  ALLRED,[facility] 11/06/2013

## 2013-11-06 NOTE — Progress Notes (Signed)
The patient was seen and examined.  His drain output is minimal.  Gram stain was gram-positive rods.  Culture may be negative as he has been on antibiotics.  This was discussed with patient.  Cystogram negative.  Foley has been removed. He is voiding without difficulty.

## 2013-11-06 NOTE — Progress Notes (Signed)
3 Days Post-Op Subjective: No complaints. Tolerating stent, foley, and drain well. No fevers/chills overnight. Feels subjectively better.  Overnight with gross hematuria continuing which started after drain placement.  Objective: Vital signs in last 24 hours: Temp:  [97.7 F (36.5 C)-98.2 F (36.8 C)] 97.8 F (36.6 C) (08/06 0518) Pulse Rate:  [66-92] 66 (08/06 0518) Resp:  [15-25] 18 (08/06 0518) BP: (112-146)/(62-79) 127/79 mmHg (08/06 0518) SpO2:  [97 %-100 %] 98 % (08/06 0518)  Intake/Output from previous day: 08/05 0701 - 08/06 0700 In: 840 [P.O.:840] Out: 935 [Urine:900; Drains:35] Intake/Output this shift:    Physical Exam:  General: Alert and oriented CV: RRR Lungs: Clear Abdomen: Soft, ND No CVA tenderness. Urine rose in foley. Drain tea-colored with some sediment. Ext: NT, No erythema  Lab Results:  Recent Labs  11/03/13 1039 11/04/13 0402 11/05/13 0350  HGB 13.3 12.4* 12.5*  HCT 38.7* 36.7* 36.4*   BMET  Recent Labs  11/05/13 0350 11/06/13 0350  NA 138 139  K 3.8 3.7  CL 106 106  CO2 23 23  GLUCOSE 173* 105*  BUN 16 17  CREATININE 0.64 0.65  CALCIUM 10.4 10.0     Studies/Results: Ct Abdomen Pelvis W Wo Contrast  11/04/2013   CLINICAL DATA:  Evaluate bladder and pelvic mass. Status post stone extraction right stent placement.  EXAM: CT ABDOMEN AND PELVIS WITHOUT AND WITH CONTRAST  TECHNIQUE: Multidetector CT imaging of the abdomen and pelvis was performed following the standard protocol before and following the bolus administration of intravenous contrast.  CONTRAST:  145mL OMNIPAQUE IOHEXOL 300 MG/ML  SOLN  COMPARISON:  CT 11/02/2013  FINDINGS: Lung bases are clear. There are small bilateral pleural effusions. There is a low-density lesion along the cardiac border at the level of the right atrium which is not changed from CT of 01/09/2011 measuring 4.7 by 1.6 cm - image 1.  No focal hepatic lesion. The gallbladder, pancreas, spleen, adrenal  glands are normal.  Renal: Noncontrast exam demonstrates several sub 5 mm calculi within the left and right kidney. There is interval extraction the calculus in the right renal pelvis. There is interval placement of a right ureteral stent. No hydronephrosis. The cortical phase imaging demonstrates no enhancing renal cortical lesion. There is a high-density nonenhancing cyst extending from the lower pole of the left kidney.  Delayed pyelogram phase imaging demonstrates no filling defects within the collecting system and ureters. No evidence of obstruction.  In the pelvis, there is a Foley catheter within the bladder. Within the left lateral wall of the bladder there is a a 5.0 x 3.8 cm low-density collection which extends from the bladder wall to the left pelvic sidewall has simple fluid attenuation a thin enhancing rim (image 86, series 4). Delayed imaging demonstrates a large bladder diverticulum on the right.  There is no pelvic lymphadenopathy. Rectum is normal. Abdominal aorta is normal. No retroperitoneal lymphadenopathy.  IMPRESSION: 1. Interval extraction of right renal pelvic calculus and placement of a right ureteral stent. 2. No evidence of hydronephrosis or obstruction. Bilateral nephrolithiasis. 3. Low-density collection in the lateral aspect of the left bladder wall extending to the pelvic sidewall with differential including cystic wall abscess versus bladder cancer recurrence. 4. 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   On: 11/04/2013 10:38   Dg Chest 2 View  11/04/2013   CLINICAL DATA:  Abnormal breath sounds right base  EXAM: CHEST  2 VIEW  COMPARISON:  11/02/2013  FINDINGS: Heart size is upper normal but stable. Vascular pattern is normal. There is mild subsegmental atelectasis at both lung bases, new from the prior study. There is a tiny right pleural effusion which is also new.  IMPRESSION: Tiny right pleural effusion.  Mild subsegmental atelectasis at the bilateral lung bases.   Electronically Signed   By: Skipper Cliche M.D.   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 Please perform CT-guided aspiration and/or drainage catheter placement for treatment as well as diagnostic purposes. Please Send aspirated sample for cytologic analysis.  EXAM: CT IMAGE GUIDED DRAINAGE BY PERCUTANEOUS CATHETER  COMPARISON:  CT abdomen pelvis - 11/04/2013  MEDICATIONS: The patient is currently admitted to the hospital and receiving intravenous antibiotics. The antibiotics were administered within an appropriate time frame prior to the initiation of the procedure.  ANESTHESIA/SEDATION: Fentanyl 100 mcg IV; Versed 4 mg IV  Total Moderate Sedation time  20 minutes  CONTRAST:  None  COMPLICATIONS: None immediate  PROCEDURE: Informed written consent was obtained from the patient after a discussion of the risks, benefits and alternatives to treatment. The patient was placed supine on the CT gantry and a pre procedural CT was performed re-demonstrating the known abscess/fluid collection within the left lower abdomen/ upper pelvis, measuring approximately 4.7 x 4.2 cm (image 25, series 2). The procedure was planned. A timeout was performed prior to the initiation of the procedure.  The skin overlying the left lower abdomen/pelvis was prepped and draped in the usual sterile fashion. The overlying soft tissues were anesthetized with 1% lidocaine with epinephrine. Appropriate trajectory was planned with the use of a 22 gauge spinal needle. An 18 gauge trocar needle was advanced into the abscess/fluid collection and a short Amplatz super stiff wire was coiled within the collection. Appropriate positioning was confirmed  with a limited CT scan. The tract was serially dilated allowing placement of a 10 [place] all-purpose drainage catheter. Appropriate positioning was confirmed with a limited postprocedural CT scan.  Approximately 15 cc of cloudy fluid was aspirated. The tube was connected to a drainage bag and sutured in place. A dressing was placed. The patient tolerated the procedure well without immediate post procedural complication.  IMPRESSION: Successful CT guided placement of a 10 French all purpose drain catheter into the left lower abdomen/pelvis with aspiration of approximately 15 cc of cloudy fluid. Samples were sent to the laboratory as requested by the ordering clinical team. Aspirated sample was also sent for cytologic analysis.   Electronically Signed   By: Sandi Mariscal M.D.   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 Multiple negative urine cultures over past week. He underwent cysto, R stent placement with no purulence behind stone. Also with foley irritation, and submucosal lesion on L lateral wall of bladder. Will need cysto, stone extraction, and bladder biopsy in a few weeks once infectious source is identified and treated.  Percutaneous drain placement yesterday in left pelvic fluid collection, with new onset gross hematuria. Differential  includes bladder injury vs bleeding from irritation from stone/stent/foley due to transferring during procedure.  Will perform CT cystogram. If negative for urine leak, will d/c foley. If drain output remains low after foley removed, may d/c drain prior to discharge possibly tomorrow.  Discussed with Dr. Junious Silk.   LOS: 3 days   Margo Aye 11/06/2013, 7:19  AM

## 2013-11-07 LAB — BASIC METABOLIC PANEL
Anion gap: 10 (ref 5–15)
BUN: 16 mg/dL (ref 6–23)
CHLORIDE: 105 meq/L (ref 96–112)
CO2: 24 meq/L (ref 19–32)
CREATININE: 0.72 mg/dL (ref 0.50–1.35)
Calcium: 9.8 mg/dL (ref 8.4–10.5)
GFR calc Af Amer: >90 mL/min (ref >90)
GFR calc non Af Amer: >90 mL/min (ref >90)
GLUCOSE: 154 mg/dL — AB (ref 70–99)
Potassium: 3.7 mEq/L (ref 3.7–5.3)
Sodium: 139 mEq/L (ref 137–147)

## 2013-11-07 LAB — CBC
HEMATOCRIT: 37.2 % — AB (ref 39.0–52.0)
Hemoglobin: 12.5 g/dL — ABNORMAL LOW (ref 13.0–17.0)
MCH: 28.5 pg (ref 26.0–34.0)
MCHC: 33.6 g/dL (ref 30.0–36.0)
MCV: 84.9 fL (ref 78.0–100.0)
Platelets: 109 10*3/uL — ABNORMAL LOW (ref 150–400)
RBC: 4.38 MIL/uL (ref 4.22–5.81)
RDW: 12.1 % (ref 11.5–15.5)
WBC: 19.3 10*3/uL — ABNORMAL HIGH (ref 4.0–10.5)

## 2013-11-07 LAB — GLUCOSE, CAPILLARY
GLUCOSE-CAPILLARY: 142 mg/dL — AB (ref 70–99)
GLUCOSE-CAPILLARY: 107 mg/dL — AB (ref 70–99)
GLUCOSE-CAPILLARY: 144 mg/dL — AB (ref 70–99)
Glucose-Capillary: 98 mg/dL (ref 70–99)

## 2013-11-07 NOTE — Progress Notes (Signed)
CARE MANAGEMENT NOTE 11/07/2013  Patient:  Justin Huber, Justin Huber   Account Number:  [id number]  Date Initiated:  11/04/2013  Documentation initiated by:  [facility]  Subjective/Objective Assessment:   63 Y/O M ADMITTED W/SEPSIS.     Action/Plan:   FROM HOME.   Anticipated DC Date:  11/10/2013   Anticipated DC Plan:  [place]  CM consult      Choice offered to / List presented to:             Status of service:  In process, will continue to follow Medicare Important Message given?  YES (If response is "NO", the following Medicare IM given date fields will be blank) Date Medicare IM given:  11/07/2013 Medicare IM given by:  [facility] Date Additional Medicare IM given:   Additional Medicare IM given by:    Discharge Disposition:    Per UR Regulation:  Reviewed for med. necessity/level of care/duration of stay  If discussed at [place] of Stay Meetings, dates discussed:    Comments:  11/07/13 Alizzon Dioguardi RN,BSN NCM [phone] POD#4 CYSTOSCOPY/STENTABSCESS-DRAIN.CX PEND.URINE RETENTION.NO ANTICIPATED D/C NEEDS.  11/04/13 Brailon Don RN,BSN NCM [phone] NO ANTICIPATED D/C NEEDS.

## 2013-11-07 NOTE — Progress Notes (Signed)
4 Days Post-Op Subjective: Yesterday, foley removed. Felt that he was voiding well all day. First PVR checked at 8pm: 470. Overnight between 400 and 500 with voided volumes around 200. He thinks he's voiding a little bit less than usual.  He walked at about 7pm, then at 8pm noticed the drain output was more bloody. He had some rigors at 8pm with tachycardia and low grade temp. Tylenol was given before true fever was reached. Overnight, things resolved and he feels back to normal this morning.  Objective: Vital signs in last 24 hours: Temp:  [97.7 F (36.5 C)-99.3 F (37.4 C)] 97.7 F (36.5 C) (08/07 0426) Pulse Rate:  [63-134] 75 (08/07 0426) Resp:  [16-28] 16 (08/07 0426) BP: (116-151)/(47-101) 116/47 mmHg (08/07 0426) SpO2:  [94 %-100 %] 96 % (08/07 0426)  Intake/Output from previous day: 08/06 0701 - 08/07 0700 In: 1130 [P.O.:1080; IV Piggyback:50] Out: 939 [Urine:875; Drains:64] Intake/Output this shift:    Physical Exam:  General: Alert and oriented CV: RRR Lungs: Clear Abdomen: Soft, ND Drain: serosang Foley removed. Ext: NT, No erythema  Lab Results:  Recent Labs  11/05/13 0350 11/07/13 0422  HGB 12.5* 12.5*  HCT 36.4* 37.2*   BMET  Recent Labs  11/06/13 0350 11/07/13 0422  NA 139 139  K 3.7 3.7  CL 106 105  CO2 23 24  GLUCOSE 105* 154*  BUN 17 16  CREATININE 0.65 0.72  CALCIUM 10.0 9.8     Studies/Results: Ct Pelvis W Contrast  11/06/2013   CLINICAL DATA:  Gross hematuria following left-sided pelvic drainage catheter placement. Evaluate for bladder injury/urine leak. 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  CONTRAST:  128mL OMNIPAQUE IOHEXOL 300 MG/ML   SOLN  COMPARISON:  CT-guided left-sided pelvic drainage catheter placement - 11/05/2013; CT abdomen pelvis - 11/02/2013; 10/19/2012; 07/11/2006  FINDINGS: The patient has undergone left anterior lower abdominal / pelvic approach percutaneous drainage catheter which is appropriately coiled within the previously demonstrated indeterminate left pelvic sidewall fluid collection which has minimally decreased in size, now measuring approximately 3.9 x 3.5 cm (image 27, series 2), previously, 4.2 x 4.7 cm. This pelvic fluid collection is again noted to results in mass effect upon the left side of the urinary bladder. There is no passage of excreted contrast (even on the provided delayed images) into this pelvic fluid collection. Trace amount of fluid within the pelvic cul-de-sac. No new definable/drainable fluid collections.  Grossly unchanged size of large, approximately 7.7 x 3.9 x 6.5 cm right-sided urinary bladder diverticulum. Appropriately positioned right-sided double-J ureteral stent with inferior coil within the right lateral aspect of the urinary bladder. The left ureter appears normal throughout its imaged mid and caudal aspects. A Foley catheter is noted within the urinary bladder with a minimal amount of nondependent air. There is diffuse wall thickening involving the urinary bladder, most conspicuously about the left lateral side wall.  Extensive colonic diverticulosis without evidence of diverticulitis.  There is a minimal amount of subcutaneous emphysema within the right lower abdominal wall, presumably at the location of medication administration.  No acute or aggressive osseus abnormalities.  IMPRESSION: 1. Appropriately positioned percutaneous drainage catheter within the indeterminate left pelvic sidewall fluid collection which has minimally decreased in size in the interval.  There is no extravasation of excreted urinary contrast (even on the provided delayed images) to suggest a urine leak. 2. Rather  diffuse thickening of the urinary bladder wall, most conspicuous about the left lateral wall. Further evaluation could be performed with cystoscopy as clinically indicated. 3. 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 4. Appropriately positioned right-sided double-J ureteral stent. 5. Colonic diverticulosis without evidence of diverticulitis.   Electronically Signed   By: Sandi Mariscal M.D.   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 Please perform CT-guided aspiration and/or drainage catheter placement for treatment as well as diagnostic purposes. Please Send aspirated sample for cytologic analysis.  EXAM: CT IMAGE GUIDED DRAINAGE BY PERCUTANEOUS CATHETER  COMPARISON:  CT abdomen pelvis - 11/04/2013  MEDICATIONS: The patient is currently admitted to the hospital and receiving intravenous antibiotics. The antibiotics were administered within an appropriate time frame prior to the initiation of the procedure.  ANESTHESIA/SEDATION: Fentanyl 100 mcg IV; Versed 4 mg IV  Total Moderate Sedation time  20 minutes  CONTRAST:  None  COMPLICATIONS: None immediate  PROCEDURE: Informed written consent was obtained from the patient after a discussion of the risks, benefits and alternatives to treatment. The patient was placed supine on the CT gantry and a pre procedural CT was performed re-demonstrating the known abscess/fluid collection within the left lower abdomen/ upper pelvis, measuring approximately 4.7 x 4.2 cm (image 25, series 2). The procedure was planned. A timeout was performed prior to the initiation of the procedure.  The skin overlying the left lower abdomen/pelvis was prepped and draped in the usual sterile fashion. The overlying soft  tissues were anesthetized with 1% lidocaine with epinephrine. Appropriate trajectory was planned with the use of a 22 gauge spinal needle. An 18 gauge trocar needle was advanced into the abscess/fluid collection and a short Amplatz super stiff wire was coiled within the collection. Appropriate positioning was confirmed with a limited CT scan. The tract was serially dilated allowing placement of a 10 [place] all-purpose drainage catheter. Appropriate positioning was confirmed with a limited postprocedural CT scan.  Approximately 15 cc of cloudy fluid was aspirated. The tube was connected to a drainage bag and sutured in place. A dressing was placed. The patient tolerated the procedure well without immediate post procedural complication.  IMPRESSION: Successful CT guided placement of a 10 French all purpose drain catheter into the left lower abdomen/pelvis with aspiration of approximately 15 cc of cloudy fluid. Samples were sent to the laboratory as requested by the ordering clinical team. Aspirated sample was also sent for cytologic analysis.   Electronically Signed   By: Sandi Mariscal M.D.   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 Multiple negative urine cultures over past week. He underwent cysto, R stent placement with no purulence behind stone. Also with foley irritation, and submucosal lesion on L lateral wall of bladder. Will need cysto, stone extraction, and bladder biopsy in a few weeks once infectious source is identified and treated.  Percutaneous drain placement 8/5 in left pelvic fluid collection, with new onset gross hematuria. CT cystogram was negative for urine leak and foley was removed.   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 Will monitor for now. Continue flomax.  His return of rigors is interesting. I think this is due most likely due to mobilization and irritation of abscess cavity by the drain, but could be due to bladder overfilling. If he has any more rigors today, I think we should re-insert the foley. With his overall clinical picture, I think we should watch him another 24 hrs or at least until cultures return before discharging home. I would like to see the drain output plateau before removing.  Discussed with Dr. Alinda Money    LOS: 4 days   Margo Aye 11/07/2013, 7:31 AM  I have personally seen and examined the patients and agree with the above findings.  Mr. Vanatta has an increasing WBC and therefore his drain will be left in place currently.  Should recheck a CBC in the am and if continues to increase, he may need repeat imaging.  Otherwise, if clinically improved, will assess drain output and whether drain can be removed or if he should be discharged with it in place.  Will also need to determine appropriate po antibiotics upon discharge which, if cultures remain negative, may need to be empiric based on gram stain.

## 2013-11-07 NOTE — Progress Notes (Signed)
[facility] PROGRESS NOTE  Justin Huber IWL:[id number] DOB: Oct 04, 1950 DOA: 11/03/2013 PCP: Sherrie Mustache, MD    Assessment/Plan  Severe sepsis with hypotension, fevers, initially thought to be due to UTI, however, only 0-2 WBC on UA, culture from 8/2 negative and there was no purulence when stone was removed by urology.  CXR negative, however, I hear rales at the right base today - maybe his dehydration prevented [place] from seeing PNA on initial CXR.  Concern that source may be complex fluid collection near bladder.  Fevers resolving with ceftriaxone, however, blood pressures still low normal.  -  IR aspirated fluid on 8/5, awaiting cultures and cytology negative for malignant cells. - Continue current antibiotic regimen.  Bladder mass/ Pelvic mass - IR placed drain. Not thought to be a mass but rather an abscess given aspirated fluid collection - Awaiting cultures  Hypokalemia, resolved with IV supplementation -  D/c IVF  Normocytic anemia, likely hemodilutional -  Stable, will continue to monitor.  Thrombocytopenia - Acute phase reactant.  On lovenox, not heparin. - reassess cbc next am.  Hyperglycemia -  Check A1c -  Start SSI  Diet:  regular Access:  PIV IVF:  off Proph:  SCDs  Code Status: full Family Communication: patient and wife Disposition Plan:  Awaiting cultures and cytology of Aspiration of perivesicular fluid collection.     Consultants:  Urology, Dr. Junious Silk  IR  Procedures: -  cystourethroscopy, right ureteral stent placement, right retrograde pyelogram, removal of stone on 8/3 by Drs. Glo Herring and Eskridge -  CT abd/pelvis WO contrast -  CT abd/pelvis W contrast  -  CXR  Antibiotics:  Ceftriaxone 8/3 >>  HPI/Subjective:  No new complaints. No acute issues overnight.  Objective: Filed Vitals:   11/06/13 2149 11/06/13 2330 11/07/13 0426 11/07/13 1430  BP: 142/65  116/47 139/83  Pulse: 134  75 72  Temp: 99.1 F (37.3 C)  99.3 F (37.4 C) 97.7 F (36.5 C) 97.9 F (36.6 C)  TempSrc: Oral Oral Oral Oral  Resp: 24  16 18   Height:      Weight:      SpO2: 94%  96% 96%    Intake/Output Summary (Last 24 hours) at 11/07/13 1718 Last data filed at 11/07/13 1300  Gross per 24 hour  Intake    600 ml  Output    939 ml  Net   -339 ml   Filed Weights   11/03/13 1438  Weight: 98.88 kg (217 lb 15.9 oz)    Exam:  General: Alert, awake, oriented x3, in no acute distress. HEENT: No bruits, no goiter. Heart: Regular rate and rhythm, without murmurs, rubs, gallops. Lungs: Clear to auscultation bilaterally., no wheezes Abdomen: Soft, nontender, nondistended, positive bowel sounds. Drain in place and draining sero sanquinous fluid Extremities: No clubbing cyanosis or edema with positive pedal pulses. Neuro: Grossly intact, nonfocal.  Data Reviewed: Basic Metabolic Panel:  Recent Labs Lab 11/03/13 1039 11/04/13 0402 11/05/13 0350 11/06/13 0350 11/07/13 0422  NA 139 137 138 139 139  K 3.5* 4.0 3.8 3.7 3.7  CL 103 104 106 106 105  CO2 19 24 23 23 24   GLUCOSE 106* 290* 173* 105* 154*  BUN 20 19 16 17 16   CREATININE 0.82 0.78 0.64 0.65 0.72  CALCIUM 10.5 9.9 10.4 10.0 9.8   Liver Function Tests:  Recent Labs Lab 11/03/13 1456  AST 65*  ALT 44  ALKPHOS 148*  BILITOT 1.7*  PROT 6.5  ALBUMIN 2.6*  No results found for this basename: LIPASE, AMYLASE,  in the last 168 hours No results found for this basename: AMMONIA,  in the last 168 hours CBC:  Recent Labs Lab 11/02/13 0721 11/03/13 1039 11/04/13 0402 11/05/13 0350 11/07/13 0422  WBC 6.4 9.6 8.6 12.3* 19.3*  NEUTROABS 6.2 9.4*  --   --   --   HGB 13.5 13.3 12.4* 12.5* 12.5*  HCT 39.1 38.7* 36.7* 36.4* 37.2*  MCV 85.6 84.5 86.8 84.5 84.9  PLT 197 143* 134* 127* 109*   Cardiac Enzymes: No results found for this basename: CKTOTAL, CKMB, CKMBINDEX, TROPONINI,  in the last 168 hours BNP (last 3 results) No results found for this  basename: PROBNP,  in the last 8760 hours CBG:  Recent Labs Lab 11/06/13 1150 11/06/13 1702 11/06/13 2115 11/07/13 0721 11/07/13 1140  GLUCAP 120* 125* 119* 142* 107*    Recent Results (from the past 240 hour(s))  URINE CULTURE     Status: None   Collection Time    11/02/13  7:01 AM      Result Value Ref Range Status   Specimen Description URINE, CATHETERIZED   Final   Special Requests NONE   Final   Culture  Setup Time     Final   Value: 11/02/2013 09:55     Performed at [facility]     Final   Value: NO GROWTH     Performed at [facility]   Culture     Final   Value: NO GROWTH     Performed at [facility]   Report Status 11/03/2013 FINAL   Final  CULTURE, BLOOD (ROUTINE X 2)     Status: None   Collection Time    11/03/13 10:39 AM      Result Value Ref Range Status   Specimen Description BLOOD LEFT HAND   Final   Special Requests     Final   Value: BOTTLES DRAWN AEROBIC AND ANAEROBIC 5 CC PATIENT ON FOLLOWING KEFLEX AND CIPRO   Culture  Setup Time     Final   Value: 11/03/2013 15:05     Performed at [facility]   Culture     Final   Value:        BLOOD CULTURE RECEIVED NO GROWTH TO DATE CULTURE WILL BE HELD FOR 5 DAYS BEFORE ISSUING A FINAL NEGATIVE REPORT     Performed at [facility]   Report Status PENDING   Incomplete  CULTURE, BLOOD (ROUTINE X 2)     Status: None   Collection Time    11/03/13 10:39 AM      Result Value Ref Range Status   Specimen Description BLOOD RIGHT HAND   Final   Special Requests     Final   Value: BOTTLES DRAWN AEROBIC AND ANAEROBIC 3 CC EACH PATIENT ON FOLLOWING KEFLEX AND CIPRO   Culture  Setup Time     Final   Value: 11/03/2013 15:06     Performed at [facility]   Culture     Final   Value:        BLOOD CULTURE RECEIVED NO GROWTH TO DATE CULTURE WILL BE HELD FOR 5 DAYS BEFORE ISSUING A FINAL NEGATIVE REPORT     Performed at [facility]   Report Status  PENDING   Incomplete  CULTURE, ROUTINE-ABSCESS     Status: None   Collection Time    11/05/13 10:17  AM      Result Value Ref Range Status   Specimen Description ABSCESS DRAINAGE   Final   Special Requests Normal   Final   Gram Stain     Final   Value: ABUNDANT WBC PRESENT, PREDOMINANTLY MONONUCLEAR     NO SQUAMOUS EPITHELIAL CELLS SEEN     ABUNDANT GRAM POSITIVE RODS     Performed at [facility]   Culture     Final   Value: NO GROWTH 2 DAYS     Performed at [facility]   Report Status PENDING   Incomplete     Studies: Ct Pelvis W Contrast  11/06/2013   CLINICAL DATA:  Gross hematuria following left-sided pelvic drainage catheter placement. Evaluate for bladder injury/urine leak. 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  CONTRAST:  128mL OMNIPAQUE IOHEXOL 300 MG/ML  SOLN  COMPARISON:  CT-guided left-sided pelvic drainage catheter placement - 11/05/2013; CT abdomen pelvis - 11/02/2013; 10/19/2012; 07/11/2006  FINDINGS: The patient has undergone left anterior lower abdominal / pelvic approach percutaneous drainage catheter which is appropriately coiled within the previously demonstrated indeterminate left pelvic sidewall fluid collection which has minimally decreased in size, now measuring approximately 3.9 x 3.5 cm (image 27, series 2), previously, 4.2 x 4.7 cm. This pelvic fluid collection is again noted to results in mass effect upon the left side of the urinary bladder. There is no passage of excreted contrast (even on the provided delayed images) into this pelvic fluid collection. Trace amount of fluid within the pelvic cul-de-sac. No new definable/drainable fluid collections.   Grossly unchanged size of large, approximately 7.7 x 3.9 x 6.5 cm right-sided urinary bladder diverticulum. Appropriately positioned right-sided double-J ureteral stent with inferior coil within the right lateral aspect of the urinary bladder. The left ureter appears normal throughout its imaged mid and caudal aspects. A Foley catheter is noted within the urinary bladder with a minimal amount of nondependent air. There is diffuse wall thickening involving the urinary bladder, most conspicuously about the left lateral side wall.  Extensive colonic diverticulosis without evidence of diverticulitis.  There is a minimal amount of subcutaneous emphysema within the right lower abdominal wall, presumably at the location of medication administration.  No acute or aggressive osseus abnormalities.  IMPRESSION: 1. Appropriately positioned percutaneous drainage catheter within the indeterminate left pelvic sidewall fluid collection which has minimally decreased in size in the interval. There is no extravasation of excreted urinary contrast (even on the provided delayed images) to suggest a urine leak. 2. Rather diffuse thickening of the urinary bladder wall, most conspicuous about the left lateral wall. Further evaluation could be performed with cystoscopy as clinically indicated. 3. 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 4. Appropriately positioned right-sided double-J ureteral stent. 5. Colonic diverticulosis without evidence of diverticulitis.   Electronically Signed   By: Sandi Mariscal M.D.   On: 11/06/2013 09:32    Scheduled Meds: . aspirin EC  81 mg Oral Daily  . atorvastatin  10 mg Oral QPM  . cefTRIAXone (ROCEPHIN)  IV  1 g Intravenous Q24H  . enoxaparin (LOVENOX) injection  40 mg Subcutaneous Q24H  . insulin aspart  0-9 Units Subcutaneous TID WC  . tamsulosin  0.4 mg Oral Daily  Continuous Infusions:    Principal Problem:   Severe sepsis Active Problems:    Fever   Bladder mass   Rigors   Pelvic mass   Renal calculi   Hypotension (history of hypertension)   Hypokalemia   Thrombocytopenia, unspecified   Normocytic anemia    Time spent: 35 min    Velvet Bathe  [facility] Pager [phone]. If 7PM-7AM, please contact night-coverage at [email or web address], password [facility] 11/07/2013, 5:18 PM  LOS: 4 days

## 2013-11-07 NOTE — Progress Notes (Signed)
Patient had difficult start to night.  Began with rigors and chills around 20:40.  At time vitals were stable but patient visibly shaking and stated he was cold.  Medicated with Tylenol.  Max temp 99.3 around 23:30.  Patient had incontinent episode during his chills.  Still voiding small amounts of bloody urine at a time and PVR greater than 500.  This am vital sign WNL, PVR greater than 400, and patient had a bowel movement.  Will continue to monitor patient.

## 2013-11-07 NOTE — Progress Notes (Signed)
4 Days Post-Op  Subjective: Pt with episode of rigors last night; foley out now ; still with hematuria and fluid now blood tinged in drain bag; currently afebrile  Objective: Vital signs in last 24 hours: Temp:  [97.7 F (36.5 C)-99.3 F (37.4 C)] 97.7 F (36.5 C) (08/07 0426) Pulse Rate:  [63-134] 75 (08/07 0426) Resp:  [16-28] 16 (08/07 0426) BP: (116-151)/(47-101) 116/47 mmHg (08/07 0426) SpO2:  [94 %-100 %] 96 % (08/07 0426) Last BM Date: 11/07/13  Intake/Output from previous day: 08/06 0701 - 08/07 0700 In: 1130 [P.O.:1080; IV Piggyback:50] Out: 939 [Urine:875; Drains:64] Intake/Output this shift: Total I/O In: -  Out: 375 [Urine:375]  Pelvic drain intact, insertion site ok/mildly tender, output 64 cc's blood tinged fluid; cx's pend; fluid cyt neg for malignancy  Lab Results:   Recent Labs  11/05/13 0350 11/07/13 0422  WBC 12.3* 19.3*  HGB 12.5* 12.5*  HCT 36.4* 37.2*  PLT 127* 109*   BMET  Recent Labs  11/06/13 0350 11/07/13 0422  NA 139 139  K 3.7 3.7  CL 106 105  CO2 23 24  GLUCOSE 105* 154*  BUN 17 16  CREATININE 0.65 0.72  CALCIUM 10.0 9.8   PT/INR  Recent Labs  11/05/13 0350  LABPROT 13.6  INR 1.04   ABG No results found for this basename: PHART, PCO2, PO2, HCO3,  in the last 72 hours  Studies/Results: Ct Pelvis W Contrast  11/06/2013   CLINICAL DATA:  Gross hematuria following left-sided pelvic drainage catheter placement. Evaluate for bladder injury/urine leak. 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  CONTRAST:  111mL OMNIPAQUE IOHEXOL 300 MG/ML  SOLN  COMPARISON:  CT-guided left-sided pelvic drainage catheter placement - 11/05/2013; CT  abdomen pelvis - 11/02/2013; 10/19/2012; 07/11/2006  FINDINGS: The patient has undergone left anterior lower abdominal / pelvic approach percutaneous drainage catheter which is appropriately coiled within the previously demonstrated indeterminate left pelvic sidewall fluid collection which has minimally decreased in size, now measuring approximately 3.9 x 3.5 cm (image 27, series 2), previously, 4.2 x 4.7 cm. This pelvic fluid collection is again noted to results in mass effect upon the left side of the urinary bladder. There is no passage of excreted contrast (even on the provided delayed images) into this pelvic fluid collection. Trace amount of fluid within the pelvic cul-de-sac. No new definable/drainable fluid collections.  Grossly unchanged size of large, approximately 7.7 x 3.9 x 6.5 cm right-sided urinary bladder diverticulum. Appropriately positioned right-sided double-J ureteral stent with inferior coil within the right lateral aspect of the urinary bladder. The left ureter appears normal throughout its imaged mid and caudal aspects. A Foley catheter is noted within the urinary bladder with a minimal amount of nondependent air. There is diffuse wall thickening involving the urinary bladder, most conspicuously about the left lateral side wall.  Extensive colonic diverticulosis without evidence of diverticulitis.  There is a minimal amount of subcutaneous emphysema within the right lower abdominal wall, presumably at the location of medication administration.  No acute or aggressive osseus abnormalities.  IMPRESSION: 1. Appropriately positioned percutaneous drainage catheter within the indeterminate left pelvic sidewall fluid collection which has minimally decreased in size in the interval. There is no extravasation of excreted urinary contrast (even on the  provided delayed images) to suggest a urine leak. 2. Rather diffuse thickening of the urinary bladder wall, most conspicuous about the left lateral wall.  Further evaluation could be performed with cystoscopy as clinically indicated. 3. 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 4. Appropriately positioned right-sided double-J ureteral stent. 5. Colonic diverticulosis without evidence of diverticulitis.   Electronically Signed   By: Sandi Mariscal M.D.   On: 11/06/2013 09:32    Anti-infectives: Anti-infectives   Start     Dose/Rate Route Frequency Ordered Stop   11/03/13 1623  levofloxacin (LEVAQUIN) IVPB 500 mg     500 mg 100 mL/hr over 60 Minutes Intravenous 60 min pre-op 11/03/13 1623 11/03/13 1833   11/03/13 1600  cefTRIAXone (ROCEPHIN) 1 g in dextrose 5 % 50 mL IVPB     1 g 100 mL/hr over 30 Minutes Intravenous Every 24 hours 11/03/13 1443     11/03/13 1300  cefTRIAXone (ROCEPHIN) 1 g in dextrose 5 % 50 mL IVPB     1 g 100 mL/hr over 30 Minutes Intravenous  Once 11/03/13 1253 11/03/13 1437      Assessment/Plan: s/p Procedure(s): CYSTOSCOPY WITH RIGHT RETROGRADE PYELOGRAM, RIGHT URETEROSCOPY AND STENT PLACEMENT, Litolipaxy (Right) Left pelvic fluid collection drainage 8/5; cont current tx;check final cx's, monitor labs , other plans as per urology/primary team  LOS: 4 days    ALLRED,D [facility] 11/07/2013

## 2013-11-08 LAB — BASIC METABOLIC PANEL
ANION GAP: 9 (ref 5–15)
BUN: 11 mg/dL (ref 6–23)
CO2: 26 mEq/L (ref 19–32)
Calcium: 9.6 mg/dL (ref 8.4–10.5)
Chloride: 103 mEq/L (ref 96–112)
Creatinine, Ser: 0.79 mg/dL (ref 0.50–1.35)
GFR calc non Af Amer: >90 mL/min (ref >90)
Glucose, Bld: 139 mg/dL — ABNORMAL HIGH (ref 70–99)
POTASSIUM: 3.8 meq/L (ref 3.7–5.3)
Sodium: 138 mEq/L (ref 137–147)

## 2013-11-08 LAB — CBC
HCT: 35.8 % — ABNORMAL LOW (ref 39.0–52.0)
Hemoglobin: 12.1 g/dL — ABNORMAL LOW (ref 13.0–17.0)
MCH: 28.8 pg (ref 26.0–34.0)
MCHC: 33.8 g/dL (ref 30.0–36.0)
MCV: 85.2 fL (ref 78.0–100.0)
PLATELETS: 112 10*3/uL — AB (ref 150–400)
RBC: 4.2 MIL/uL — ABNORMAL LOW (ref 4.22–5.81)
RDW: 12.2 % (ref 11.5–15.5)
WBC: 12.1 10*3/uL — AB (ref 4.0–10.5)

## 2013-11-08 LAB — GLUCOSE, CAPILLARY
GLUCOSE-CAPILLARY: 95 mg/dL (ref 70–99)
GLUCOSE-CAPILLARY: 99 mg/dL (ref 70–99)
Glucose-Capillary: 118 mg/dL — ABNORMAL HIGH (ref 70–99)
Glucose-Capillary: 226 mg/dL — ABNORMAL HIGH (ref 70–99)

## 2013-11-08 MED ORDER — ZOLPIDEM TARTRATE 5 MG PO TABS
5.0000 mg | ORAL_TABLET | Freq: Every evening | ORAL | Status: DC | PRN
  Administered 2013-11-08: 5 mg via ORAL
  Filled 2013-11-08: qty 1

## 2013-11-08 MED ORDER — CLINDAMYCIN PHOSPHATE 300 MG/50ML IV SOLN
300.0000 mg | Freq: Four times a day (QID) | INTRAVENOUS | Status: DC
  Administered 2013-11-08 – 2013-11-09 (×4): 300 mg via INTRAVENOUS
  Filled 2013-11-08 (×6): qty 50

## 2013-11-08 NOTE — Progress Notes (Signed)
5 Days Post-Op  Subjective: Pt ambulating in hallway; no acute c/o this am; had temp spike last night  Objective: Vital signs in last 24 hours: Temp:  [97.9 F (36.6 C)-102 F (38.9 C)] 98 F (36.7 C) (08/08 0504) Pulse Rate:  [66-112] 66 (08/08 0504) Resp:  [18] 18 (08/08 0504) BP: (125-161)/(63-83) 132/65 mmHg (08/08 0504) SpO2:  [93 %-98 %] 98 % (08/08 0504) Last BM Date: 11/08/13  Intake/Output from previous day: 08/07 0701 - 08/08 0700 In: 890 [P.O.:840; IV Piggyback:50] Out: 776 [Urine:725; Drains:51] Intake/Output this shift: Total I/O In: 240 [P.O.:240] Out: 35 [Drains:35]  Left pelvic drain intact, insertion site ok, minimal tenderness; output 35 cc's turbid, sl blood-tinged fluid; cx's pend; drain flushed without difficulty  Lab Results:   Recent Labs  11/07/13 0422 11/08/13 0602  WBC 19.3* 12.1*  HGB 12.5* 12.1*  HCT 37.2* 35.8*  PLT 109* 112*   BMET  Recent Labs  11/07/13 0422 11/08/13 0454  NA 139 138  K 3.7 3.8  CL 105 103  CO2 24 26  GLUCOSE 154* 139*  BUN 16 11  CREATININE 0.72 0.79  CALCIUM 9.8 9.6   PT/INR No results found for this basename: LABPROT, INR,  in the last 72 hours ABG No results found for this basename: PHART, PCO2, PO2, HCO3,  in the last 72 hours  Studies/Results: No results found.  Anti-infectives: Anti-infectives   Start     Dose/Rate Route Frequency Ordered Stop   11/03/13 1623  levofloxacin (LEVAQUIN) IVPB 500 mg     500 mg 100 mL/hr over 60 Minutes Intravenous 60 min pre-op 11/03/13 1623 11/03/13 1833   11/03/13 1600  cefTRIAXone (ROCEPHIN) 1 g in dextrose 5 % 50 mL IVPB     1 g 100 mL/hr over 30 Minutes Intravenous Every 24 hours 11/03/13 1443     11/03/13 1300  cefTRIAXone (ROCEPHIN) 1 g in dextrose 5 % 50 mL IVPB     1 g 100 mL/hr over 30 Minutes Intravenous  Once 11/03/13 1253 11/03/13 1437      Assessment/Plan: s/p left pelvic fluid collection drainage 8/5; cont current tx;check final cx's, monitor  labs ; check f/u CT  next week; if pt goes home with drain recommend once daily flushing with 5 cc's sterile NS, recording of output and dressing changes every 2-3 days. Call [phone] to coordinate scheduling of f/u CT.    LOS: 5 days    Clytee Heinrich,D [facility] 11/08/2013

## 2013-11-08 NOTE — Progress Notes (Signed)
[facility] PROGRESS NOTE  Justin Huber MAU:[id number] DOB: 19-Jan-1951 DOA: 11/03/2013 PCP: Sherrie Mustache, MD    Assessment/Plan  Severe sepsis with hypotension, fevers, initially thought to be due to UTI, however, only 0-2 WBC on UA, culture from 8/2 negative and there was no purulence when stone was removed by urology.  CXR negative, however, I hear rales at the right base today - maybe his dehydration prevented [place] from seeing PNA on initial CXR.  Concern that source may be complex fluid collection near bladder.  Fevers resolving with ceftriaxone, however, blood pressures still low normal.  -  IR aspirated fluid on 8/5, awaiting cultures and cytology negative for malignant cells.  Bladder mass/ Pelvic mass - IR placed drain. Not thought to be a mass but rather an abscess given aspirated fluid collection - Awaiting cultures - Change abx to clindamycin IV  Hypokalemia, resolved with IV supplementation -  D/c IVF  Normocytic anemia, likely hemodilutional -  Stable, will continue to monitor.  Thrombocytopenia - Acute phase reactant.  On lovenox, not heparin. - reassess cbc next am.  Hyperglycemia -  Check A1c -  Start SSI  Diet:  regular Access:  PIV IVF:  off Proph:  SCDs  Code Status: full Family Communication: patient and wife Disposition Plan:  Awaiting cultures   Consultants:  Urology, Dr. Junious Silk  IR  Procedures: -  cystourethroscopy, right ureteral stent placement, right retrograde pyelogram, removal of stone on 8/3 by Drs. Glo Herring and Eskridge -  CT abd/pelvis WO contrast -  CT abd/pelvis W contrast  -  CXR  Antibiotics:  Ceftriaxone 8/3 >>  HPI/Subjective:  No new complaints. No acute issues overnight.  Objective: Filed Vitals:   11/07/13 1430 11/07/13 2125 11/07/13 2247 11/08/13 0504  BP: 139/83 161/63 125/66 132/65  Pulse: 72 112 95 66  Temp: 97.9 F (36.6 C) 102 F (38.9 C) 98.9 F (37.2 C) 98 F (36.7 C)  TempSrc: Oral  Oral Oral Oral  Resp: 18 18  18   Height:      Weight:      SpO2: 96% 93%  98%    Intake/Output Summary (Last 24 hours) at 11/08/13 1454 Last data filed at 11/08/13 0903  Gross per 24 hour  Intake    530 ml  Output    436 ml  Net     94 ml   Filed Weights   11/03/13 1438  Weight: 98.88 kg (217 lb 15.9 oz)    Exam:  General: Alert, awake, oriented x3, in no acute distress. HEENT: No bruits, no goiter. Heart: Regular rate and rhythm, without murmurs, rubs, gallops. Lungs: Clear to auscultation bilaterally., no wheezes Abdomen: Soft, nontender, nondistended, positive bowel sounds. Drain in place and draining sero sanquinous fluid Extremities: No clubbing cyanosis or edema with positive pedal pulses. Neuro: Grossly intact, nonfocal.  Data Reviewed: Basic Metabolic Panel:  Recent Labs Lab 11/04/13 0402 11/05/13 0350 11/06/13 0350 11/07/13 0422 11/08/13 0454  NA 137 138 139 139 138  K 4.0 3.8 3.7 3.7 3.8  CL 104 106 106 105 103  CO2 24 23 23 24 26   GLUCOSE 290* 173* 105* 154* 139*  BUN 19 16 17 16 11   CREATININE 0.78 0.64 0.65 0.72 0.79  CALCIUM 9.9 10.4 10.0 9.8 9.6   Liver Function Tests:  Recent Labs Lab 11/03/13 1456  AST 65*  ALT 44  ALKPHOS 148*  BILITOT 1.7*  PROT 6.5  ALBUMIN 2.6*   No results found for this basename: LIPASE,  AMYLASE,  in the last 168 hours No results found for this basename: AMMONIA,  in the last 168 hours CBC:  Recent Labs Lab 11/02/13 0721 11/03/13 1039 11/04/13 0402 11/05/13 0350 11/07/13 0422 11/08/13 0602  WBC 6.4 9.6 8.6 12.3* 19.3* 12.1*  NEUTROABS 6.2 9.4*  --   --   --   --   HGB 13.5 13.3 12.4* 12.5* 12.5* 12.1*  HCT 39.1 38.7* 36.7* 36.4* 37.2* 35.8*  MCV 85.6 84.5 86.8 84.5 84.9 85.2  PLT 197 143* 134* 127* 109* 112*   Cardiac Enzymes: No results found for this basename: CKTOTAL, CKMB, CKMBINDEX, TROPONINI,  in the last 168 hours BNP (last 3 results) No results found for this basename: PROBNP,  in the  last 8760 hours CBG:  Recent Labs Lab 11/07/13 1140 11/07/13 1713 11/07/13 2128 11/08/13 0727 11/08/13 1145  GLUCAP 107* 98 144* 118* 95    Recent Results (from the past 240 hour(s))  URINE CULTURE     Status: None   Collection Time    11/02/13  7:01 AM      Result Value Ref Range Status   Specimen Description URINE, CATHETERIZED   Final   Special Requests NONE   Final   Culture  Setup Time     Final   Value: 11/02/2013 09:55     Performed at [facility] Count     Final   Value: NO GROWTH     Performed at [facility]   Culture     Final   Value: NO GROWTH     Performed at [facility]   Report Status 11/03/2013 FINAL   Final  CULTURE, BLOOD (ROUTINE X 2)     Status: None   Collection Time    11/03/13 10:39 AM      Result Value Ref Range Status   Specimen Description BLOOD LEFT HAND   Final   Special Requests     Final   Value: BOTTLES DRAWN AEROBIC AND ANAEROBIC 5 CC PATIENT ON FOLLOWING KEFLEX AND CIPRO   Culture  Setup Time     Final   Value: 11/03/2013 15:05     Performed at [facility]   Culture     Final   Value:        BLOOD CULTURE RECEIVED NO GROWTH TO DATE CULTURE WILL BE HELD FOR 5 DAYS BEFORE ISSUING A FINAL NEGATIVE REPORT     Performed at [facility]   Report Status PENDING   Incomplete  CULTURE, BLOOD (ROUTINE X 2)     Status: None   Collection Time    11/03/13 10:39 AM      Result Value Ref Range Status   Specimen Description BLOOD RIGHT HAND   Final   Special Requests     Final   Value: BOTTLES DRAWN AEROBIC AND ANAEROBIC 3 CC EACH PATIENT ON FOLLOWING KEFLEX AND CIPRO   Culture  Setup Time     Final   Value: 11/03/2013 15:06     Performed at [facility]   Culture     Final   Value:        BLOOD CULTURE RECEIVED NO GROWTH TO DATE CULTURE WILL BE HELD FOR 5 DAYS BEFORE ISSUING A FINAL NEGATIVE REPORT     Performed at [facility]   Report Status PENDING   Incomplete  CULTURE,  ROUTINE-ABSCESS     Status: None   Collection Time  11/05/13 10:17 AM      Result Value Ref Range Status   Specimen Description ABSCESS DRAINAGE   Final   Special Requests Normal   Final   Gram Stain     Final   Value: ABUNDANT WBC PRESENT, PREDOMINANTLY MONONUCLEAR     NO SQUAMOUS EPITHELIAL CELLS SEEN     ABUNDANT GRAM POSITIVE RODS     Performed at [facility]   Culture     Final   Value: NO GROWTH 2 DAYS     Performed at [facility]   Report Status PENDING   Incomplete     Studies: No results found.  Scheduled Meds: . aspirin EC  81 mg Oral Daily  . atorvastatin  10 mg Oral QPM  . cefTRIAXone (ROCEPHIN)  IV  1 g Intravenous Q24H  . insulin aspart  0-9 Units Subcutaneous TID WC  . tamsulosin  0.4 mg Oral Daily   Continuous Infusions:    Principal Problem:   Severe sepsis Active Problems:   Fever   Bladder mass   Rigors   Pelvic mass   Renal calculi   Hypotension (history of hypertension)   Hypokalemia   Thrombocytopenia, unspecified   Normocytic anemia    Time spent: 35 min    Velvet Bathe  [facility] Pager [phone]. If 7PM-7AM, please contact night-coverage at [email or web address], password [facility] 11/08/2013, 2:54 PM  LOS: 5 days

## 2013-11-08 NOTE — Progress Notes (Signed)
Patient ID: Justin Huber, male   DOB: 1950/09/29, 63 y.o.   MRN: [id number]  5 Days Post-Op Subjective: Patient had fever to 102 last night. Fever was short lived and not associated with rigors as it was the night before.  He has continued to void better.  He otherwise is feeling well this morning and denies pain, nausea, or vomiting.  Objective: Vital signs in last 24 hours: Temp:  [97.9 F (36.6 C)-102 F (38.9 C)] 98 F (36.7 C) (08/08 0504) Pulse Rate:  [66-112] 66 (08/08 0504) Resp:  [18] 18 (08/08 0504) BP: (125-161)/(63-83) 132/65 mmHg (08/08 0504) SpO2:  [93 %-98 %] 98 % (08/08 0504)  Intake/Output from previous day: 08/07 0701 - 08/08 0700 In: 32 [P.O.:840; IV Piggyback:50] Out: 776 [Urine:725; Drains:51] Intake/Output this shift:    Physical Exam:  General: Alert and oriented CV: RRR Lungs: Clear Abdomen: Soft, ND, Pelvic drain with purulent draining still Ext: NT, No erythema  Lab Results:  Recent Labs  11/07/13 0422 11/08/13 0602  HGB 12.5* 12.1*  HCT 37.2* 35.8*   BMET  Recent Labs  11/07/13 0422 11/08/13 0454  NA 139 138  K 3.7 3.8  CL 105 103  CO2 24 26  GLUCOSE 154* 139*  BUN 16 11  CREATININE 0.72 0.79  CALCIUM 9.8 9.6    Lab Results  Component Value Date   WBC 12.1* 11/08/2013   HGB 12.1* 11/08/2013   HCT 35.8* 11/08/2013   MCV 85.2 11/08/2013   PLT 112* 11/08/2013   Results: Cytology negative Culture: GP rods on GS, No growth yet on culture  Assessment/Plan: Pelvic abscess s/p drainage  - WBC improved over last 24 hours but patient again had fever last night. Clinical picture remains confusing.  Could be resolving infection related to abscess with correct treatment vs. inappropriate antibiotic therapy (culture pending) vs possibly another source of fever. Since drain is still draining, I would leave in place.    -If patient continues to clinically improve over next 24 hours (no high fever, WBC stable or improving), he may be able to  be discharged home with drain in place with outpatient follow up. It would be useful to consider transition to oral antibiotic to cover GP rods (PCN, Clindamycin) today to evaluate effectiveness of empiric oral therapy since culture may ultimately prove to be negative since patient was on antibiotic prior to abscess drainage.  If patient continues to have fever or increasing WBC over next 24 hours and culture is still negative, may be beneficial to get ID input.    LOS: 5 days   Justin Huber,LES 11/08/2013, 7:42 AM

## 2013-11-08 NOTE — Progress Notes (Signed)
Patient spiked temp of 102 at 21:25 tonight, BP 161/63, and pulse of 112.  Medicated with prn Tylenol and notified NP on call. No new orders at that time. Vital recheck at 22:47 showed a temp of 98.9,  BP 125/66, and pulse of 95.  Will continue to moinitor patient.

## 2013-11-09 LAB — BASIC METABOLIC PANEL
ANION GAP: 10 (ref 5–15)
BUN: 11 mg/dL (ref 6–23)
CALCIUM: 10 mg/dL (ref 8.4–10.5)
CO2: 24 meq/L (ref 19–32)
CREATININE: 0.66 mg/dL (ref 0.50–1.35)
Chloride: 104 mEq/L (ref 96–112)
GFR calc Af Amer: >90 mL/min (ref >90)
GFR calc non Af Amer: >90 mL/min (ref >90)
Glucose, Bld: 122 mg/dL — ABNORMAL HIGH (ref 70–99)
Potassium: 3.5 mEq/L — ABNORMAL LOW (ref 3.7–5.3)
Sodium: 138 mEq/L (ref 137–147)

## 2013-11-09 LAB — CULTURE, BLOOD (ROUTINE X 2)
CULTURE: NO GROWTH
Culture: NO GROWTH

## 2013-11-09 LAB — CBC
HCT: 35.5 % — ABNORMAL LOW (ref 39.0–52.0)
Hemoglobin: 12.7 g/dL — ABNORMAL LOW (ref 13.0–17.0)
MCH: 30.3 pg (ref 26.0–34.0)
MCHC: 35.8 g/dL (ref 30.0–36.0)
MCV: 84.7 fL (ref 78.0–100.0)
Platelets: 115 10*3/uL — ABNORMAL LOW (ref 150–400)
RBC: 4.19 MIL/uL — ABNORMAL LOW (ref 4.22–5.81)
RDW: 12.3 % (ref 11.5–15.5)
WBC: 12.1 10*3/uL — ABNORMAL HIGH (ref 4.0–10.5)

## 2013-11-09 LAB — GLUCOSE, CAPILLARY
GLUCOSE-CAPILLARY: 120 mg/dL — AB (ref 70–99)
GLUCOSE-CAPILLARY: 113 mg/dL — AB (ref 70–99)

## 2013-11-09 MED ORDER — OXYCODONE HCL 5 MG PO TABS: 5.0000 mg | ORAL_TABLET | ORAL | Status: DC | PRN

## 2013-11-09 MED ORDER — CLINDAMYCIN HCL 300 MG PO CAPS: 300.0000 mg | ORAL_CAPSULE | Freq: Four times a day (QID) | ORAL | Status: DC

## 2013-11-09 MED ORDER — TAMSULOSIN HCL 0.4 MG PO CAPS: 0.4000 mg | ORAL_CAPSULE | Freq: Every day | ORAL | Status: DC

## 2013-11-09 MED ORDER — HYDRALAZINE HCL 10 MG PO TABS
10.0000 mg | ORAL_TABLET | Freq: Four times a day (QID) | ORAL | Status: DC | PRN
  Administered 2013-11-09: 10 mg via ORAL
  Filled 2013-11-09: qty 1

## 2013-11-09 NOTE — Progress Notes (Signed)
Patient discharge instructions given, teach back obtained, denies any additional questions or concerns at this time, denies pain at this time. Escorted by RN via wheelchair to car.

## 2013-11-09 NOTE — Plan of Care (Signed)
Problem: Discharge Progression Outcomes Goal: Tubes and drains discontinued if indicated Outcome: Completed/Met Date Met:  11/09/13 Patient will go home with LLQ drain, education provided on dressing change, flushing drain, and recording output. Teach back obtained.

## 2013-11-09 NOTE — Progress Notes (Signed)
Patient ID: Justin Huber, male   DOB: [birth year], 63 y.o.   MRN: [id number]  6 Days Post-Op Subjective: Pt feeling better.  He did have low grade fever last night but remained asymptomatic.  Although he has spiked fevers each of the last few nights, each night has been better. Denies pain.  Voiding well.  PVR this morning was 0 cc. He has now been on clindamycin for 24 hours.  Objective: Vital signs in last 24 hours: Temp:  [98.1 F (36.7 C)-100.5 F (38.1 C)] 99.1 F (37.3 C) (08/09 0638) Pulse Rate:  [78-88] 88 (08/09 0511) Resp:  [18] 18 (08/09 0511) BP: (141-175)/(62-78) 141/62 mmHg (08/09 0638) SpO2:  [95 %-98 %] 95 % (08/09 0511)  Intake/Output from previous day: 08/08 0701 - 08/09 0700 In: 390 [P.O.:240; IV Piggyback:150] Out: 85 [Drains:85] Intake/Output this shift:    Physical Exam:  General: Alert and oriented Abd: Soft, ND, NT Drain still with purulent drainage.  Lab Results:  Recent Labs  11/07/13 0422 11/08/13 0602 11/09/13 0405  HGB 12.5* 12.1* 12.7*  HCT 37.2* 35.8* 35.5*   Lab Results  Component Value Date   WBC 12.1* 11/09/2013   HGB 12.7* 11/09/2013   HCT 35.5* 11/09/2013   MCV 84.7 11/09/2013   PLT 115* 11/09/2013     BMET  Recent Labs  11/08/13 0454 11/09/13 0405  NA 138 138  K 3.8 3.5*  CL 103 104  CO2 26 24  GLUCOSE 139* 122*  BUN 11 11  CREATININE 0.79 0.66  CALCIUM 9.6 10.0     Studies/Results: Abscess culture still pending.  Assessment/Plan: 1) Pelvic abscess: Etiology of abscess remains unclear.  Possible infected lymphocele although not completely consistent with clinical picture. Since his drain is still having moderate output, it should remain in place.  However, he continues to improve clinically and objectively.  I think he can be discharged on oral clindamycin with plans to follow up with Dr. Junious Silk as an outpatient in about a week for possible drain removal and to follow up on his culture results.    He also will need  stent removal in the future by Dr. Junious Silk.   LOS: 6 days   Justin Huber,Justin Huber 11/09/2013, 9:13 AM

## 2013-11-09 NOTE — Progress Notes (Signed)
Pt's high HS CBG was likely related to just having consumed an entire [employer] sandwich with chips and a soft drink.

## 2013-11-09 NOTE — Discharge Summary (Signed)
Physician Discharge Summary  Justin Huber MWN:[id number] DOB: [birth year] DOA: 11/03/2013  PCP: Sherrie Mustache, MD  Admit date: 11/03/2013 Discharge date: 11/09/2013  Time spent: > 35 minutes  Recommendations for Outpatient Follow-up:  1. Please see d/c instruction 2. F/u with abscess culture results 3. Will be discharged on clindamycin for 6 more days to complete a 12 day antibiotic total tx course. Please decide whether or not to prolong antibiotic course 4. Reassess WBC levels 5. Patient will require stent removal as well as drain removal by urologist after discharge. Patient is aware and verbalizes understanding.  Discharge Diagnoses:  Please see list below  Discharge Condition: Stable  Diet recommendation: Low sodium heart healthy  Filed Weights   11/03/13 1438  Weight: 98.88 kg (217 lb 15.9 oz)    History of present illness:  From original history of present illness: Justin Huber is an 63 y.o. 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 He was first evaluated for hematuria while traveling in [place], underwent a cystoscopy and resection of a bladder mass while there, but pathology was benign. He subsequently followed up with Dr. Janice Norrie with quarterly cystoscopies to evaluate for tumor recurrence. About a year later, Dr. Janice Norrie found a recurrence in a small diverticula, underwent a laparoscopic surgery, but the area could not be found, so he was referred to [place] for diverticulectomy (had 4 taken out), and the pathology was benign on those as well. About a week ago, the patient developed dysuria and went to an [facility] where he was placed on a seven-day course of Cipro. Cultures were ultimately negative, and despite treatment with antibiotics,  the patient developed fever and rigors 2 days ago The patient was found to have intra-abdominal mass adjacent to bladder wall of which was found to be abscess and subsequently drained by IR.   Hospital Course:   Severe sepsis with hypotension, fevers, most likely secondary to abscess adjacent to bladder wall  - IR aspirated fluid on 8/5, awaiting cultures urology to followup - Cytology negative for malignant cells.  - Discharged on clindamycin patient to complete 6 more days for a total antibiotic treatment of 12 days.  Bladder mass/ Pelvic mass  - IR placed drain. Not thought to be a mass but rather an abscess given aspirated fluid collection  - Awaiting cultures  - Change abx to clindamycin and currently tolerating well with white blood cell count trending down  Hypokalemia, resolved with IV supplementation  - Mild suspect improvement with improved oral intake.  Normocytic anemia, likely hemodilutional  - Stable elevated from prior value on last check  Thrombocytopenia  - Acute phase reactant.   Hyperglycemia  - Check A1c 5.7 as such patient in the prediabetic range  - Discussed lifestyle modifications and increase in exercise activity once able to   Procedures:   please see above   Consultations:   IR   Urology: Dr. Alinda Money  Discharge Exam: Filed Vitals:   11/09/13 0900  BP:   Pulse:   Temp: 98.9 F (37.2 C)  Resp:     General: Pt in nad, alert and awake Cardiovascular: rrr, no mrg Respiratory: cta bl, no wheezes  Discharge Instructions You were cared for by a hospitalist during your hospital stay. If you have any  questions about your discharge medications or the care you received while you were in the hospital after you are discharged, you can call the unit and asked to speak with the hospitalist on call if the hospitalist that took care of you is not available. Once you are discharged, your primary care physician will handle any further medical issues. Please  note that NO REFILLS for any discharge medications will be authorized once you are discharged, as it is imperative that you return to your primary care physician (or establish a relationship with a primary care physician if you do not have one) for your aftercare needs so that they can reassess your need for medications and monitor your lab values.  Discharge Instructions   Call MD for:  redness, tenderness, or signs of infection (pain, swelling, redness, odor or green/yellow discharge around incision site)    Complete by:  As directed      Call MD for:  severe uncontrolled pain    Complete by:  As directed      Call MD for:  temperature >100.4    Complete by:  As directed      Diet - low sodium heart healthy    Complete by:  As directed      Discharge instructions    Complete by:  As directed   Please f/u with your urologist. Call their office after discharge to discuss f/u recommendations     Increase activity slowly    Complete by:  As directed             Medication List    STOP taking these medications       cephALEXin 500 MG capsule  Commonly known as:  KEFLEX      TAKE these medications       aspirin EC 81 MG tablet  Take 81 mg by mouth every evening.     atorvastatin 10 MG tablet  Commonly known as:  LIPITOR  Take 10 mg by mouth every evening.     clindamycin 300 MG capsule  Commonly known as:  CLEOCIN  Take 1 capsule (300 mg total) by mouth 4 (four) times daily.     hydrochlorothiazide 25 MG tablet  Commonly known as:  HYDRODIURIL  Take 12.5 mg by mouth every morning.     irbesartan 150 MG tablet  Commonly known as:  AVAPRO  Take 150 mg by mouth daily.     meloxicam 7.5 MG tablet  Commonly known as:  MOBIC  Take 1 tablet by mouth 2 (two) times daily.     oxyCODONE 5 MG immediate release tablet  Commonly known as:  Oxy IR/ROXICODONE  Take 1 tablet (5 mg total) by mouth every 4 (four) hours as needed for moderate pain.     tamsulosin 0.4 MG Caps capsule   Commonly known as:  FLOMAX  Take 1 capsule (0.4 mg total) by mouth daily.       No Known Allergies     Follow-up Information   Follow up with Festus Aloe, MD. (The office will call to schedule follow up for one week.)    Specialty:  Urology   Contact information:   [place] [address] [phone]        The results of significant diagnostics from this hospitalization (including imaging, microbiology, ancillary and laboratory) are listed below for reference.    Significant Diagnostic Studies: Ct Abdomen Pelvis W Wo Contrast  11/04/2013   CLINICAL DATA:  Evaluate bladder  and pelvic mass. Status post stone extraction right stent placement.  EXAM: CT ABDOMEN AND PELVIS WITHOUT AND WITH CONTRAST  TECHNIQUE: Multidetector CT imaging of the abdomen and pelvis was performed following the standard protocol before and following the bolus administration of intravenous contrast.  CONTRAST:  144mL OMNIPAQUE IOHEXOL 300 MG/ML  SOLN  COMPARISON:  CT 11/02/2013  FINDINGS: Lung bases are clear. There are small bilateral pleural effusions. There is a low-density lesion along the cardiac border at the level of the right atrium which is not changed from CT of 01/09/2011 measuring 4.7 by 1.6 cm - image 1.  No focal hepatic lesion. The gallbladder, pancreas, spleen, adrenal glands are normal.  Renal: Noncontrast exam demonstrates several sub 5 mm calculi within the left and right kidney. There is interval extraction the calculus in the right renal pelvis. There is interval placement of a right ureteral stent. No hydronephrosis. The cortical phase imaging demonstrates no enhancing renal cortical lesion. There is a high-density nonenhancing cyst extending from the lower pole of the left kidney.  Delayed pyelogram phase imaging demonstrates no filling defects within the collecting system and ureters. No evidence of obstruction.  In the pelvis, there is a Foley catheter within the bladder.  Within the left lateral wall of the bladder there is a a 5.0 x 3.8 cm low-density collection which extends from the bladder wall to the left pelvic sidewall has simple fluid attenuation a thin enhancing rim (image 86, series 4). Delayed imaging demonstrates a large bladder diverticulum on the right.  There is no pelvic lymphadenopathy. Rectum is normal. Abdominal aorta is normal. No retroperitoneal lymphadenopathy.  IMPRESSION: 1. Interval extraction of right renal pelvic calculus and placement of a right ureteral stent. 2. No evidence of hydronephrosis or obstruction. Bilateral nephrolithiasis. 3. Low-density collection in the lateral aspect of the left bladder wall extending to the pelvic sidewall with differential including cystic wall abscess versus bladder cancer recurrence. 4. 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   On: 11/04/2013 10:38   Ct Abdomen Pelvis Wo Contrast  11/02/2013   CLINICAL DATA:  Fever, urinary retention, dysuria. History of bladder cancer.  EXAM: CT ABDOMEN AND PELVIS WITHOUT CONTRAST  TECHNIQUE: Multidetector CT imaging of the abdomen and pelvis was performed following the standard protocol without IV contrast.  COMPARISON:  10/19/2012  FINDINGS: Dependent bibasilar atelectasis noted.  Unenhanced liver, gallbladder, adrenal glands, spleen, and pancreas are unremarkable. Small duodenum diverticulum incidentally noted. Bilateral nonobstructing renal calculi are identified. 0.9 cm probable hyperdense left posterior renal cortical cyst identified image 39. There is a 6 mm proximal right ureteral calculus identified image 39. Nonspecific bilateral perinephric stranding is identified. No perinephric fluid collection. No free air or fluid.  Evidence of prior pelvic sidewall dissection. There is an ill-defined soft tissue mass at the left pelvic sidewall with apparent invasion of the bladder wall and adjacent left  lateral pelvic sidewall structures measuring 4.9 x 4.3 cm image 81. Adjacent bladder wall thickening and surrounding stranding noted. This is separate from the adjacent sigmoid colon which appears unremarkable. A Foley catheter is in place and the bladder is decompressed. Right-sided decompressed probable bladder diverticulum or less likely lymphangioma/lymphocele is incidentally noted image 86. A probable bladder calculus is noted dependently image 87 measuring 4 mm.  Colonic diverticuli noted without evidence for diverticulitis. Small retroperitoneal nodes are identified, largest adjacent to the left common iliac chain measuring 5 mm image 69.  Mild lumbar spine disc degenerative change is noted. No lytic or sclerotic osseous lesion is identified.  IMPRESSION: 4.9 cm left pelvic sidewall mass arising from or invading the adjacent bladder (possibly within a previously seen bladder diverticulum), with bladder wall thickening identified and a dependent bladder calculus noted. This is highly suspicious for bladder carcinoma recurrence. Cystitis is felt less likely.  5 mm dominant left common iliac chain lymph node which is suspicious for nodal metastatic disease.  Right lateral bladder wall diverticulum. 6 mm proximal right ureteral calculus without hydroureteronephrosis.  Bilateral nonobstructing renal calculi.  These results were called by telephone at the time of interpretation on 11/02/2013 at 1:04 pm to Dr. Jinny Blossom, [facility] , who verbally acknowledged these results.   Electronically Signed   By: Conchita Paris M.D.   On: 11/02/2013 13:09   Dg Chest 2 View  11/04/2013   CLINICAL DATA:  Abnormal breath sounds right base  EXAM: CHEST  2 VIEW  COMPARISON:  11/02/2013  FINDINGS: Heart size is upper normal but stable. Vascular pattern is normal. There is mild subsegmental atelectasis at both lung bases, new from the prior study. There is a tiny right pleural effusion which is also new.  IMPRESSION: Tiny right pleural  effusion. Mild subsegmental atelectasis at the bilateral lung bases.   Electronically Signed   By: Skipper Cliche M.D.   On: 11/04/2013 17:30   Dg Chest 2 View  11/02/2013   CLINICAL DATA:  Fever  EXAM: CHEST  2 VIEW  COMPARISON:  None.  FINDINGS: The heart size and mediastinal contours are within normal limits. Both lungs are clear. The visualized skeletal structures are unremarkable. Minimal biapical pleural thickening.  IMPRESSION: No active cardiopulmonary disease.   Electronically Signed   By: Conchita Paris M.D.   On: 11/02/2013 12:42   Ct Pelvis W Contrast  11/06/2013   CLINICAL DATA:  Gross hematuria following left-sided pelvic drainage catheter placement. Evaluate for bladder injury/urine leak. 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  CONTRAST:  185mL OMNIPAQUE IOHEXOL 300 MG/ML  SOLN  COMPARISON:  CT-guided left-sided pelvic drainage catheter placement - 11/05/2013; CT abdomen pelvis - 11/02/2013; 10/19/2012; 07/11/2006  FINDINGS: The patient has undergone left anterior lower abdominal / pelvic approach percutaneous drainage catheter which is appropriately coiled within the previously demonstrated indeterminate left pelvic sidewall fluid collection which has minimally decreased in size, now measuring approximately 3.9 x 3.5 cm (image 27, series 2), previously, 4.2 x 4.7 cm. This pelvic fluid collection is again noted to results in mass effect upon the left side of the urinary bladder. There is no passage of excreted contrast (even on the provided delayed images) into this pelvic fluid collection. Trace amount of fluid within the pelvic cul-de-sac. No new definable/drainable fluid collections.  Grossly  unchanged size of large, approximately 7.7 x 3.9 x 6.5 cm right-sided urinary bladder diverticulum. Appropriately positioned right-sided double-J ureteral stent with inferior coil within the right lateral aspect of the urinary bladder. The left ureter appears normal throughout its imaged mid and caudal aspects. A Foley catheter is noted within the urinary bladder with a minimal amount of nondependent air. There is diffuse wall thickening involving the urinary bladder, most conspicuously about the left lateral side wall.  Extensive colonic  diverticulosis without evidence of diverticulitis.  There is a minimal amount of subcutaneous emphysema within the right lower abdominal wall, presumably at the location of medication administration.  No acute or aggressive osseus abnormalities.  IMPRESSION: 1. Appropriately positioned percutaneous drainage catheter within the indeterminate left pelvic sidewall fluid collection which has minimally decreased in size in the interval. There is no extravasation of excreted urinary contrast (even on the provided delayed images) to suggest a urine leak. 2. Rather diffuse thickening of the urinary bladder wall, most conspicuous about the left lateral wall. Further evaluation could be performed with cystoscopy as clinically indicated. 3. 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 4. Appropriately positioned right-sided double-J ureteral stent. 5. Colonic diverticulosis without evidence of diverticulitis.   Electronically Signed   By: Sandi Mariscal M.D.   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 Please perform CT-guided aspiration and/or drainage catheter placement for treatment as well as diagnostic purposes.  Please Send aspirated sample for cytologic analysis.  EXAM: CT IMAGE GUIDED DRAINAGE BY PERCUTANEOUS CATHETER  COMPARISON:  CT abdomen pelvis - 11/04/2013  MEDICATIONS: The patient is currently admitted to the hospital and receiving intravenous antibiotics. The antibiotics were administered within an appropriate time frame prior to the initiation of the procedure.  ANESTHESIA/SEDATION: Fentanyl 100 mcg IV; Versed 4 mg IV  Total Moderate Sedation time  20 minutes  CONTRAST:  None  COMPLICATIONS: None immediate  PROCEDURE: Informed written consent was obtained from the patient after a discussion of the risks, benefits and alternatives to treatment. The patient was placed supine on the CT gantry and a pre procedural CT was performed re-demonstrating the known abscess/fluid collection within the left lower abdomen/ upper pelvis, measuring approximately 4.7 x 4.2 cm (image 25, series 2). The procedure was planned. A timeout was performed prior to the initiation of the procedure.  The skin overlying the left lower abdomen/pelvis was prepped and draped in the usual sterile fashion. The overlying soft tissues were anesthetized with 1% lidocaine with epinephrine. Appropriate trajectory was planned with the use of a 22 gauge spinal needle. An 18 gauge trocar needle was advanced into the abscess/fluid collection and a short Amplatz super stiff wire was coiled within the collection. Appropriate positioning was confirmed with a limited CT scan. The tract was serially dilated allowing placement of a 10 [place] all-purpose drainage catheter. Appropriate positioning was confirmed with a limited postprocedural CT scan.  Approximately 15 cc of cloudy fluid was aspirated. The tube was connected to a drainage bag and sutured in place. A dressing was placed. The patient tolerated the procedure well without immediate post procedural complication.  IMPRESSION: Successful CT guided placement of a 10 French all purpose drain catheter into  the left lower abdomen/pelvis with aspiration of approximately 15 cc of cloudy fluid. Samples were sent to the laboratory as requested by the ordering clinical team. Aspirated sample was also sent for cytologic analysis.   Electronically Signed   By: Sandi Mariscal M.D.   On: 11/05/2013 11:02    Microbiology: Recent Results (from the past 240 hour(s))  URINE CULTURE     Status: None   Collection Time    11/02/13  7:01 AM      Result Value Ref Range Status  Specimen Description URINE, CATHETERIZED   Final   Special Requests NONE   Final   Culture  Setup Time     Final   Value: 11/02/2013 09:55     Performed at [facility] Count     Final   Value: NO GROWTH     Performed at [facility]   Culture     Final   Value: NO GROWTH     Performed at [facility]   Report Status 11/03/2013 FINAL   Final  CULTURE, BLOOD (ROUTINE X 2)     Status: None   Collection Time    11/03/13 10:39 AM      Result Value Ref Range Status   Specimen Description BLOOD LEFT HAND   Final   Special Requests     Final   Value: BOTTLES DRAWN AEROBIC AND ANAEROBIC 5 CC PATIENT ON FOLLOWING KEFLEX AND CIPRO   Culture  Setup Time     Final   Value: 11/03/2013 15:05     Performed at [facility]   Culture     Final   Value:        BLOOD CULTURE RECEIVED NO GROWTH TO DATE CULTURE WILL BE HELD FOR 5 DAYS BEFORE ISSUING A FINAL NEGATIVE REPORT     Performed at [facility]   Report Status PENDING   Incomplete  CULTURE, BLOOD (ROUTINE X 2)     Status: None   Collection Time    11/03/13 10:39 AM      Result Value Ref Range Status   Specimen Description BLOOD RIGHT HAND   Final   Special Requests     Final   Value: BOTTLES DRAWN AEROBIC AND ANAEROBIC 3 CC EACH PATIENT ON FOLLOWING KEFLEX AND CIPRO   Culture  Setup Time     Final   Value: 11/03/2013 15:06     Performed at [facility]   Culture     Final   Value:        BLOOD CULTURE RECEIVED NO GROWTH TO DATE  CULTURE WILL BE HELD FOR 5 DAYS BEFORE ISSUING A FINAL NEGATIVE REPORT     Performed at [facility]   Report Status PENDING   Incomplete  CULTURE, ROUTINE-ABSCESS     Status: None   Collection Time    11/05/13 10:17 AM      Result Value Ref Range Status   Specimen Description ABSCESS DRAINAGE   Final   Special Requests Normal   Final   Gram Stain     Final   Value: ABUNDANT WBC PRESENT, PREDOMINANTLY MONONUCLEAR     NO SQUAMOUS EPITHELIAL CELLS SEEN     ABUNDANT GRAM POSITIVE RODS     Performed at [facility]   Culture     Final   Value: NO GROWTH 2 DAYS     Performed at [facility]   Report Status PENDING   Incomplete     Labs: Basic Metabolic Panel:  Recent Labs Lab 11/05/13 0350 11/06/13 0350 11/07/13 0422 11/08/13 0454 11/09/13 0405  NA 138 139 139 138 138  K 3.8 3.7 3.7 3.8 3.5*  CL 106 106 105 103 104  CO2 23 23 24 26 24   GLUCOSE 173* 105* 154* 139* 122*  BUN 16 17 16 11 11   CREATININE 0.64 0.65 0.72 0.79 0.66  CALCIUM 10.4 10.0 9.8 9.6 10.0   Liver Function Tests:  Recent Labs Lab 11/03/13 1456  AST 65*  ALT 44  ALKPHOS 148*  BILITOT 1.7*  PROT 6.5  ALBUMIN 2.6*   No results found for this basename: LIPASE, AMYLASE,  in the last 168 hours No results found for this basename: AMMONIA,  in the last 168 hours CBC:  Recent Labs Lab 11/03/13 1039 11/04/13 0402 11/05/13 0350 11/07/13 0422 11/08/13 0602 11/09/13 0405  WBC 9.6 8.6 12.3* 19.3* 12.1* 12.1*  NEUTROABS 9.4*  --   --   --   --   --   HGB 13.3 12.4* 12.5* 12.5* 12.1* 12.7*  HCT 38.7* 36.7* 36.4* 37.2* 35.8* 35.5*  MCV 84.5 86.8 84.5 84.9 85.2 84.7  PLT 143* 134* 127* 109* 112* 115*   Cardiac Enzymes: No results found for this basename: CKTOTAL, CKMB, CKMBINDEX, TROPONINI,  in the last 168 hours BNP: BNP (last 3 results) No results found for this basename: PROBNP,  in the last 8760 hours CBG:  Recent Labs Lab 11/08/13 0727 11/08/13 1145 11/08/13 1653  11/08/13 2235 11/09/13 0759  GLUCAP 118* 95 99 226* 120*       Signed:  Velvet Bathe  [facility] 11/09/2013, 11:26 AM

## 2013-11-11 LAB — CULTURE, ROUTINE-ABSCESS: SPECIAL REQUESTS: NORMAL

## 2013-11-16 ENCOUNTER — Other Ambulatory Visit: Payer: Self-pay | Admitting: Urology

## 2013-11-18 ENCOUNTER — Other Ambulatory Visit (HOSPITAL_COMMUNITY): Payer: Self-pay | Admitting: Urology

## 2013-11-18 DIAGNOSIS — I898 Other specified noninfective disorders of lymphatic vessels and lymph nodes: Secondary | ICD-10-CM

## 2013-11-25 ENCOUNTER — Other Ambulatory Visit: Payer: Self-pay | Admitting: Urology

## 2013-11-26 ENCOUNTER — Ambulatory Visit (HOSPITAL_COMMUNITY)
Admission: RE | Admit: 2013-11-26 | Discharge: 2013-11-26 | Disposition: A | Discharge status: Home / Self Care | Payer: Medicare HMO | Source: Ambulatory Visit | Attending: Urology | Admitting: Urology

## 2013-11-26 ENCOUNTER — Other Ambulatory Visit (HOSPITAL_COMMUNITY): Payer: Self-pay | Admitting: Interventional Radiology

## 2013-11-26 ENCOUNTER — Ambulatory Visit (HOSPITAL_COMMUNITY)
Admission: RE | Admit: 2013-11-26 | Discharge: 2013-11-26 | Disposition: A | Discharge status: Home / Self Care | Payer: Medicare HMO | Source: Ambulatory Visit | Attending: Interventional Radiology | Admitting: Interventional Radiology

## 2013-11-26 ENCOUNTER — Encounter (HOSPITAL_COMMUNITY): Payer: Self-pay

## 2013-11-26 DIAGNOSIS — I898 Other specified noninfective disorders of lymphatic vessels and lymph nodes: Secondary | ICD-10-CM | POA: Diagnosis not present

## 2013-11-26 DIAGNOSIS — Z9889 Other specified postprocedural states: Secondary | ICD-10-CM

## 2013-11-26 MED ORDER — IOHEXOL 300 MG/ML  SOLN
100.0000 mL | Freq: Once | INTRAMUSCULAR | Status: AC | PRN
  Administered 2013-11-26: 100 mL via INTRAVENOUS

## 2013-11-26 MED ORDER — IOHEXOL 300 MG/ML  SOLN
10.0000 mL | Freq: Once | INTRAMUSCULAR | Status: AC | PRN
  Administered 2013-11-26: 10 mL

## 2013-12-02 ENCOUNTER — Encounter (HOSPITAL_COMMUNITY): Payer: Self-pay | Admitting: Pharmacy Technician

## 2013-12-02 ENCOUNTER — Encounter (HOSPITAL_COMMUNITY)
Admission: RE | Admit: 2013-12-02 | Discharge: 2013-12-02 | Disposition: A | Discharge status: Home / Self Care | Payer: Medicare HMO | Source: Ambulatory Visit | Attending: Urology | Admitting: Urology

## 2013-12-02 ENCOUNTER — Ambulatory Visit (HOSPITAL_COMMUNITY)
Admission: RE | Admit: 2013-12-02 | Discharge: 2013-12-02 | Disposition: A | Discharge status: Home / Self Care | Payer: Medicare HMO | Source: Ambulatory Visit | Attending: Anesthesiology | Admitting: Anesthesiology

## 2013-12-02 ENCOUNTER — Encounter (HOSPITAL_COMMUNITY): Payer: Self-pay

## 2013-12-02 DIAGNOSIS — Z01818 Encounter for other preprocedural examination: Secondary | ICD-10-CM | POA: Diagnosis not present

## 2013-12-02 HISTORY — DX: Personal history of urinary calculi: Z87.442

## 2013-12-02 LAB — CBC
HCT: 38.8 % — ABNORMAL LOW (ref 39.0–52.0)
Hemoglobin: 13.3 g/dL (ref 13.0–17.0)
MCH: 29.3 pg (ref 26.0–34.0)
MCHC: 34.3 g/dL (ref 30.0–36.0)
MCV: 85.5 fL (ref 78.0–100.0)
PLATELETS: 164 10*3/uL (ref 150–400)
RBC: 4.54 MIL/uL (ref 4.22–5.81)
RDW: 13.2 % (ref 11.5–15.5)
WBC: 6.6 10*3/uL (ref 4.0–10.5)

## 2013-12-02 LAB — BASIC METABOLIC PANEL
ANION GAP: 13 (ref 5–15)
BUN: 9 mg/dL (ref 6–23)
CO2: 24 meq/L (ref 19–32)
Calcium: 10.8 mg/dL — ABNORMAL HIGH (ref 8.4–10.5)
Chloride: 103 mEq/L (ref 96–112)
Creatinine, Ser: 0.61 mg/dL (ref 0.50–1.35)
GFR calc Af Amer: >90 mL/min (ref >90)
GFR calc non Af Amer: >90 mL/min (ref >90)
Glucose, Bld: 103 mg/dL — ABNORMAL HIGH (ref 70–99)
POTASSIUM: 3.7 meq/L (ref 3.7–5.3)
SODIUM: 140 meq/L (ref 137–147)

## 2013-12-02 NOTE — Pre-Procedure Instructions (Signed)
EKG REPORT IN EPIC FROM 11-02-13. CXR REPEATED PREOP AT [place] REPORT IN EPIC ABNORMAL. TODAY'S CXR REPORT FAXED TO DR. Lyndal Rainbow OFFICE - REPORT SUGGEST FOLLOW UP CXR.

## 2013-12-02 NOTE — Patient Instructions (Signed)
YOUR SURGERY IS SCHEDULED AT [facility]  ON:  Friday  9/4  REPORT TO  SHORT STAY CENTER AT:  7:00 AM   PLEASE COME IN THE [facility] MAIN HOSPITAL ENTRANCE AND FOLLOW SIGNS TO SHORT STAY CENTER.  DO NOT EAT OR DRINK ANYTHING AFTER MIDNIGHT THE NIGHT BEFORE YOUR SURGERY.  YOU MAY BRUSH YOUR TEETH, RINSE OUT YOUR MOUTH--BUT NO WATER, NO FOOD, NO CHEWING GUM, NO MINTS, NO CANDIES, NO CHEWING TOBACCO.  PLEASE TAKE THE FOLLOWING MEDICATIONS THE AM OF YOUR SURGERY WITH A FEW SIPS OF WATER:  FLOMAX   DO NOT BRING VALUABLES, MONEY, CREDIT CARDS.  DO NOT WEAR JEWELRY, MAKE-UP, NAIL POLISH AND NO METAL PINS OR CLIPS IN YOUR HAIR. CONTACT LENS, DENTURES / PARTIALS, GLASSES SHOULD NOT BE WORN TO SURGERY AND IN MOST CASES-HEARING AIDS WILL NEED TO BE REMOVED.  BRING YOUR GLASSES CASE, ANY EQUIPMENT NEEDED FOR YOUR CONTACT LENS. FOR PATIENTS ADMITTED TO THE HOSPITAL--CHECK OUT TIME THE DAY OF DISCHARGE IS 11:00 AM.  ALL INPATIENT ROOMS ARE PRIVATE - WITH BATHROOM, TELEPHONE, TELEVISION AND WIFI INTERNET.  IF YOU ARE BEING DISCHARGED THE SAME DAY OF YOUR SURGERY--YOU CAN NOT DRIVE YOURSELF HOME--AND SHOULD NOT GO HOME ALONE BY TAXI OR BUS.  NO DRIVING OR OPERATING MACHINERY, OR MAKING LEGAL DECISIONS FOR 24 HOURS FOLLOWING ANESTHESIA / PAIN MEDICATIONS.  PLEASE MAKE ARRANGEMENTS FOR SOMEONE TO BE WITH YOU AT HOME THE FIRST 24 HOURS AFTER SURGERY. RESPONSIBLE DRIVER'S NAME / PHONE                                                   PLEASE READ OVER ANY  FACT SHEETS THAT YOU WERE GIVEN: MRSA INFORMATION, BLOOD TRANSFUSION INFORMATION, INCENTIVE SPIROMETER INFORMATION.  PLEASE BE AWARE THAT YOU MAY NEED ADDITIONAL BLOOD DRAWN DAY OF YOUR SURGERY  _______________________________________________________________________   [facility] - Preparing for Surgery Before surgery, you can play an important role.  Because skin is not sterile, your skin needs to be as free of germs as possible.  You can  reduce the number of germs on your skin by washing with CHG (chlorahexidine gluconate) soap before surgery.  CHG is an antiseptic cleaner which kills germs and bonds with the skin to continue killing germs even after washing. Please DO NOT use if you have an allergy to CHG or antibacterial soaps.  If your skin becomes reddened/irritated stop using the CHG and inform your nurse when you arrive at Short Stay. Do not shave (including legs and underarms) for at least 48 hours prior to the first CHG shower.  You may shave your face/neck. Please follow these instructions carefully:  1.  Shower with CHG Soap the night before surgery and the  morning of Surgery.  2.  If you choose to wash your hair, wash your hair first as usual with your  normal  shampoo.  3.  After you shampoo, rinse your hair and body thoroughly to remove the  shampoo.                           4.  Use CHG as you would any other liquid soap.  You can apply chg directly  to the skin and wash  Gently with a scrungie or clean washcloth.  5.  Apply the CHG Soap to your body ONLY FROM THE NECK DOWN.   Do not use on face/ open                           Wound or open sores. Avoid contact with eyes, ears mouth and genitals (private parts).                       Wash face,  Genitals (private parts) with your normal soap.             6.  Wash thoroughly, paying special attention to the area where your surgery  will be performed.  7.  Thoroughly rinse your body with warm water from the neck down.  8.  DO NOT shower/wash with your normal soap after using and rinsing off  the CHG Soap.                9.  Pat yourself dry with a clean towel.            10.  Wear clean pajamas.            11.  Place clean sheets on your bed the night of your first shower and do not  sleep with pets. Day of Surgery : Do not apply any lotions/deodorants the morning of surgery.  Please wear clean clothes to the hospital/surgery center.  FAILURE TO  FOLLOW THESE INSTRUCTIONS MAY RESULT IN THE CANCELLATION OF YOUR SURGERY PATIENT SIGNATURE_________________________________  NURSE SIGNATURE__________________________________  ________________________________________________________________________

## 2013-12-02 NOTE — Progress Notes (Signed)
12/02/13 0830  OBSTRUCTIVE SLEEP APNEA  Have you ever been diagnosed with sleep apnea through a sleep study? No  Do you snore loudly (loud enough to be heard through closed doors)?  0  Do you often feel tired, fatigued, or sleepy during the daytime? 0  Has anyone observed you stop breathing during your sleep? 0  Do you have, or are you being treated for high blood pressure? 1  BMI more than 35 kg/m2? 0  Age over 65 years old? 1  Neck circumference greater than 40 cm/16 inches? 1  Gender: 1  Obstructive Sleep Apnea Score 4  Score 4 or greater  Results sent to PCP

## 2013-12-04 NOTE — H&P (Signed)
History of Present Illness This is a new patient for me. He previously saw Dr. Janice Norrie and is followed by Dr. Dimas Millin at [facility].     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 He presented earlier in the month (Aug 2015) with irritative voiding symptoms and fevers. CT A/P revealed a fluid collection in the left pelvis, adjacent to bladder (confirmed with cystogram). A perc drain was placed and grew bacteroides fragilis + beta lactamase. Cytology confirmed abundant acute inflammation, no malignancy. He was started on po clinda. He had a temp of 100 last week, He's been flushing the drain. The drainage has turned into a very clear fluid. The only draining about 30 mL per day. He's had no further fever.    2- Nephrolithiasis - pt with bilateral small stones. Prior ESWL. Stented Aug 2015 for fever and proximal right ureteral stone. Fever was likely from lymphocele.     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 No luts once lymphocele drained and abx started. S/p bladder diverticulectomy at [facility]. His final PVR in hospital was 0. Mild BPH on exam and cystoscopy. He was started on tamsulosin.    4- Pca screening -   -Dec 2013 PSA 1.36   -Aug 2015 normal DRE/EUA   Past Medical History Problems  1. History of Cancer (199.1) 2. History of hypercholesterolemia (V12.29) 3. History of hypertension (V12.59) 4. History of kidney stones (V13.01)  Surgical History Problems  1. History of Cystoscopy (Diagnostic) 2. History of Cystoscopy Bladder Tumor (596.9) 3. History of Cystoscopy With Ureteroscopy 4. History of Lithotripsy 5. History of Neck Surgery 6. History of Repair Of Forearm 7. History of Rhinoplasty  Current Meds 1. Aspirin 81 MG Oral Tablet;  Therapy: (Recorded:31Oct2011)  to Recorded 2. Hydrochlorothiazide CAPS;  Therapy: (Recorded:31Oct2011) to Recorded 3. Lipitor 10 MG Oral Tablet;  Therapy: (Recorded:31Oct2011) to Recorded  Allergies Medication  1. No Known Drug Allergies  Family History Problems  1. Family history of Family Health Status - Mother's Age   3 2. Family history of Family Health Status Number Of Children   2 SONS 3. Family history of Nephrolithiasis 4. No pertinent family history : Mother  Social History Problems    Alcohol Use   2-3 per wk   Caffeine Use   3 PER DAY   Family history of Death In The Family Father   AGE 69 - [place]   Former smoker [place])   Marital History - Currently Married   Occupation:   VP OF OPERATIONS   Tobacco Use (V15.82)   LESS THAN 1 PACK PER DAYSMOKED FOR 15 YEARSQUIT 15 YEARS AGO  Vitals Vital Signs [Data Includes: Last 1 Day]  Recorded: 17Aug2015 03:11PM  Blood Pressure: 158 / 83 Temperature: 98.5 F Heart Rate: 69  Results/Data  Old records or history reviewed: allscript notes, epic notes, labs, cx, care everywhere.  The following images/tracing/specimen were independently visualized:  CT.    Assessment Assessed  1. 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 Bilateral kidney stones (592.0)  Plan Bilateral kidney stones  1. Follow-up Schedule Surgery Office  Follow-up  Status: Hold For - Appointment   Requested for: [phone] 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  2. Start: Amoxicillin-Pot Clavulanate 875-125 MG Oral Tablet; TAKE 1 TABLET EVERY 12  HOURS UNTIL GONE 3. INTERVENTIONAL  RADIOLOGY; Status:Hold For - Appointment,PreCert,Print,Records;  Requested for:17Aug2015;   Discussion/Summary 1- infected lymphocele - Call [phone] to coordinate scheduling of f/u pelvic CT per IR and drain d/c. I called and discussed culture results with Dr. Megan Salon / ID and he said Bacteroides is a typical infectious agent but tends to occur in mixed growth with some  Escherichia coli and enterococcus. Therefore he recommended now that the patient is on clindamycin to complete a course of Augmentin 875 twice a day 14 days.     2-right ureteral stone - stone was small and not causing significant HUN or change in kidney fxn. Discussed removing stent or scheduling URS or ESWL. All questions answered. Patient wants to go ahead and schedule ureteroscopy which is reasonable given that he has had a stent and it should be straightforward after some passive dilation of the ureter.    3-bph, bladder tic, incomplete emptying - stable. Patient on tamsulosin for now.    4-bladder cancer-no evidence on recent cystoscopy. Patient would like to follow up locally which is fine unless the lymphocele becomes a chronic issue.      Signatures Electronically signed by : Festus Aloe, M.D.; Nov 17 2013  3:59PM EST

## 2013-12-05 ENCOUNTER — Ambulatory Visit (HOSPITAL_COMMUNITY)
Admission: RE | Admit: 2013-12-05 | Discharge: 2013-12-05 | Disposition: A | Discharge status: Home / Self Care | Payer: Medicare HMO | Source: Ambulatory Visit | Attending: Urology | Admitting: Urology

## 2013-12-05 ENCOUNTER — Ambulatory Visit (HOSPITAL_COMMUNITY): Payer: Medicare HMO | Admitting: Anesthesiology

## 2013-12-05 ENCOUNTER — Encounter (HOSPITAL_COMMUNITY)
Admission: RE | Disposition: A | Discharge status: Home / Self Care | Payer: Self-pay | Source: Ambulatory Visit | Attending: Urology

## 2013-12-05 ENCOUNTER — Encounter (HOSPITAL_COMMUNITY): Payer: Self-pay | Admitting: *Deleted

## 2013-12-05 ENCOUNTER — Encounter (HOSPITAL_COMMUNITY): Payer: Medicare HMO | Admitting: Anesthesiology

## 2013-12-05 DIAGNOSIS — I10 Essential (primary) hypertension: Secondary | ICD-10-CM | POA: Insufficient documentation

## 2013-12-05 DIAGNOSIS — R339 Retention of urine, unspecified: Secondary | ICD-10-CM | POA: Diagnosis not present

## 2013-12-05 DIAGNOSIS — Z87891 Personal history of nicotine dependence: Secondary | ICD-10-CM | POA: Diagnosis not present

## 2013-12-05 DIAGNOSIS — N210 Calculus in bladder: Secondary | ICD-10-CM | POA: Insufficient documentation

## 2013-12-05 DIAGNOSIS — N200 Calculus of kidney: Secondary | ICD-10-CM | POA: Insufficient documentation

## 2013-12-05 DIAGNOSIS — Z7982 Long term (current) use of aspirin: Secondary | ICD-10-CM | POA: Diagnosis not present

## 2013-12-05 DIAGNOSIS — I898 Other specified noninfective disorders of lymphatic vessels and lymph nodes: Secondary | ICD-10-CM | POA: Diagnosis not present

## 2013-12-05 DIAGNOSIS — N401 Enlarged prostate with lower urinary tract symptoms: Secondary | ICD-10-CM | POA: Diagnosis not present

## 2013-12-05 DIAGNOSIS — Z79899 Other long term (current) drug therapy: Secondary | ICD-10-CM | POA: Diagnosis not present

## 2013-12-05 DIAGNOSIS — N138 Other obstructive and reflux uropathy: Secondary | ICD-10-CM | POA: Diagnosis not present

## 2013-12-05 DIAGNOSIS — N3289 Other specified disorders of bladder: Secondary | ICD-10-CM | POA: Diagnosis not present

## 2013-12-05 DIAGNOSIS — Z859 Personal history of malignant neoplasm, unspecified: Secondary | ICD-10-CM | POA: Diagnosis not present

## 2013-12-05 DIAGNOSIS — E7800 Pure hypercholesterolemia, unspecified: Secondary | ICD-10-CM | POA: Insufficient documentation

## 2013-12-05 HISTORY — PX: CYSTOSCOPY WITH URETEROSCOPY AND STENT PLACEMENT: SHX6377

## 2013-12-05 SURGERY — CYSTOURETEROSCOPY, WITH STENT INSERTION
Anesthesia: General | Site: Urethra | Laterality: Right

## 2013-12-05 MED ORDER — DEXAMETHASONE SODIUM PHOSPHATE 10 MG/ML IJ SOLN
INTRAMUSCULAR | Status: DC | PRN
  Administered 2013-12-05: 10 mg via INTRAVENOUS

## 2013-12-05 MED ORDER — CEFAZOLIN SODIUM-DEXTROSE 2-3 GM-% IV SOLR
2.0000 g | INTRAVENOUS | Status: AC
  Administered 2013-12-05: 2 g via INTRAVENOUS

## 2013-12-05 MED ORDER — HYDROMORPHONE HCL PF 1 MG/ML IJ SOLN
0.2500 mg | INTRAMUSCULAR | Status: DC | PRN
  Administered 2013-12-05 (×4): 0.5 mg via INTRAVENOUS

## 2013-12-05 MED ORDER — HYDROMORPHONE HCL PF 1 MG/ML IJ SOLN
INTRAMUSCULAR | Status: AC
  Filled 2013-12-05: qty 1

## 2013-12-05 MED ORDER — LIDOCAINE HCL 2 % EX GEL
CUTANEOUS | Status: DC | PRN
  Administered 2013-12-05: 1 via URETHRAL

## 2013-12-05 MED ORDER — DEXAMETHASONE SODIUM PHOSPHATE 10 MG/ML IJ SOLN: INTRAMUSCULAR | Status: DC | PRN

## 2013-12-05 MED ORDER — IOHEXOL 300 MG/ML  SOLN
INTRAMUSCULAR | Status: DC | PRN
  Administered 2013-12-05: 3 mL via URETHRAL

## 2013-12-05 MED ORDER — PROMETHAZINE HCL 25 MG/ML IJ SOLN
6.2500 mg | INTRAMUSCULAR | Status: DC | PRN
  Administered 2013-12-05: 12.5 mg via INTRAVENOUS

## 2013-12-05 MED ORDER — CEFAZOLIN SODIUM-DEXTROSE 2-3 GM-% IV SOLR
INTRAVENOUS | Status: AC
  Filled 2013-12-05: qty 50

## 2013-12-05 MED ORDER — SODIUM CHLORIDE 0.9 % IR SOLN
Status: DC | PRN
  Administered 2013-12-05 (×2): 1000 mL
  Administered 2013-12-05: 3000 mL

## 2013-12-05 MED ORDER — ONDANSETRON HCL 4 MG/2ML IJ SOLN
INTRAMUSCULAR | Status: AC
  Filled 2013-12-05: qty 2

## 2013-12-05 MED ORDER — KETOROLAC TROMETHAMINE 30 MG/ML IJ SOLN
15.0000 mg | Freq: Once | INTRAMUSCULAR | Status: AC | PRN
  Administered 2013-12-05: 30 mg via INTRAVENOUS

## 2013-12-05 MED ORDER — PROMETHAZINE HCL 25 MG/ML IJ SOLN
INTRAMUSCULAR | Status: AC
  Filled 2013-12-05: qty 1

## 2013-12-05 MED ORDER — FENTANYL CITRATE 0.05 MG/ML IJ SOLN
INTRAMUSCULAR | Status: AC
  Filled 2013-12-05: qty 2

## 2013-12-05 MED ORDER — DEXAMETHASONE SODIUM PHOSPHATE 10 MG/ML IJ SOLN
INTRAMUSCULAR | Status: AC
  Filled 2013-12-05: qty 1

## 2013-12-05 MED ORDER — FENTANYL CITRATE 0.05 MG/ML IJ SOLN
25.0000 ug | INTRAMUSCULAR | Status: DC | PRN
  Administered 2013-12-05 (×2): 50 ug via INTRAVENOUS

## 2013-12-05 MED ORDER — ONDANSETRON HCL 4 MG/2ML IJ SOLN
INTRAMUSCULAR | Status: DC | PRN
  Administered 2013-12-05: 4 mg via INTRAVENOUS

## 2013-12-05 MED ORDER — LACTATED RINGERS IV SOLN
INTRAVENOUS | Status: DC
  Administered 2013-12-05: 1000 mL via INTRAVENOUS
  Administered 2013-12-05: 11:00:00 via INTRAVENOUS

## 2013-12-05 MED ORDER — ONDANSETRON HCL 4 MG/2ML IJ SOLN: INTRAMUSCULAR | Status: DC | PRN

## 2013-12-05 MED ORDER — FENTANYL CITRATE 0.05 MG/ML IJ SOLN
INTRAMUSCULAR | Status: DC | PRN
  Administered 2013-12-05: 50 ug via INTRAVENOUS
  Administered 2013-12-05: 25 ug via INTRAVENOUS
  Administered 2013-12-05: 50 ug via INTRAVENOUS
  Administered 2013-12-05 (×3): 25 ug via INTRAVENOUS
  Administered 2013-12-05: 50 ug via INTRAVENOUS
  Administered 2013-12-05 (×4): 25 ug via INTRAVENOUS

## 2013-12-05 MED ORDER — PROPOFOL 10 MG/ML IV BOLUS
INTRAVENOUS | Status: DC | PRN
  Administered 2013-12-05: 150 mg via INTRAVENOUS
  Administered 2013-12-05: 50 mg via INTRAVENOUS

## 2013-12-05 MED ORDER — PROPOFOL 10 MG/ML IV BOLUS
INTRAVENOUS | Status: AC
  Filled 2013-12-05: qty 20

## 2013-12-05 MED ORDER — LIDOCAINE HCL 2 % EX GEL
CUTANEOUS | Status: AC
  Filled 2013-12-05: qty 10

## 2013-12-05 MED ORDER — KETOROLAC TROMETHAMINE 30 MG/ML IJ SOLN
INTRAMUSCULAR | Status: AC
  Filled 2013-12-05: qty 1

## 2013-12-05 MED ORDER — FENTANYL CITRATE 0.05 MG/ML IJ SOLN
INTRAMUSCULAR | Status: AC
  Filled 2013-12-05: qty 5

## 2013-12-05 SURGICAL SUPPLY — 19 items
BAG URO CATCHER STRL LF (DRAPE) ×4 IMPLANT
BASKET ZERO TIP NITINOL 2.4FR (BASKET) ×3 IMPLANT
BSKT STON RTRVL ZERO TP 2.4FR (BASKET) ×2
CATH INTERMIT  6FR 70CM (CATHETERS) ×4 IMPLANT
CATH URET 5FR 28IN CONE TIP (BALLOONS) ×2
CATH URET 5FR 70CM CONE TIP (BALLOONS) ×2 IMPLANT
CLOTH BEACON ORANGE TIMEOUT ST (SAFETY) ×4 IMPLANT
DRAPE CAMERA CLOSED 9X96 (DRAPES) ×7 IMPLANT
GLOVE BIO SURGEON STRL SZ7.5 (GLOVE) ×8 IMPLANT
GOWN STRL REUS W/TWL XL LVL3 (GOWN DISPOSABLE) ×4 IMPLANT
GUIDEWIRE STR DUAL SENSOR (WIRE) ×7 IMPLANT
MANIFOLD NEPTUNE II (INSTRUMENTS) ×4 IMPLANT
PACK CYSTO (CUSTOM PROCEDURE TRAY) ×4 IMPLANT
SHEATH ACCESS URETERAL 38CM (SHEATH) ×3 IMPLANT
SHEATH ACCESS URETERAL 54CM (SHEATH) ×4 IMPLANT
STENT CONTOUR 6FRX28X.038 (STENTS) ×3 IMPLANT
TUBING CONNECTING 10 (TUBING) ×3 IMPLANT
TUBING CONNECTING 10' (TUBING) ×1
WIRE COONS/BENSON .038X145CM (WIRE) ×4 IMPLANT

## 2013-12-05 NOTE — Op Note (Signed)
Preoperative diagnosis: Right nephrolithiasis, bladder diverticulum Postoperative diagnosis: Same  Procedure: Cystoscopy Right ureteroscopy Right retrograde pyelogram Right ureteral stent placement  Surgeon: Junious Silk  Anesthesia: Denenny  Type of anesthesia: Gen.  Findings: On cystoscopy the urethra appeared normal, the prostate had a small median lobe and a high bladder neck but minimal lateral lobe hypertrophy. The bladder had some trabeculation and cellules. There is a right diverticulum which was carefully inspected and noted to be normal. Importantly, all the inflammation of the left bladder wall left bladder neck seen previously have resolved after the infected lymphocele had been drained. The mucosa looked normal and I could see likely a scar where one of the diverticulum had been removed. There were no concerning findings in the bladder. It looked much improved.   On right ureteroscopy there is significant inflammation from the ureteral stent and unfortunately minimal dilation. I was quite surprised by this. The proximal ureter was too tight to accept the digital ureteroscope. I could get an older fiberoptic ureteroscope and then had a narrow lumen but the optics were terrible. The picture was very tiny, flexibility limited and I could not locate any stones.  right retrograde pyelogram-this outlined a single ureter single collecting system unit without filling defect, stricture or dilation. The ureter was a bit irregular which was likely due to the fact that it contained a wire and also the mucosa had a lot of inflammation from the stent.  Description of procedure: The patient was brought to the operating room and after adequate anesthesia he is placed in lithotomy position and prepped and draped in the usual sterile fashion. A timeout was performed to confirm the patient and procedure. The cystoscope was passed per urethra and the bladder carefully examined. The diverticulum was  carefully examined. The mucosa again appeared normal and there were no stones or foreign bodies in the bladder.  The right ureteral stent was grasped and removed through the urethral meatus. Through the stent a sensor wire was advanced and coiled in the collecting system. Adjacent to the stent I ran a rigid ureteroscope to ensure no stones in the ureter. The ureter was significantly inflamed from the stent and I could tell even in the distal ureter that we did not obtain good passive dilation. I couldn't get the rigid scope up over the iliacs. Therefore under direct vision I passed a second sensor wire and back to this scope out.  With a second sensor wire medium access sheath was advanced without difficulty. The digital ureteroscope was passed into the proximal ureter but the last few centimeters of the ureter up to the UPJ was very tight. It began to grip around the scope and I was concerned about causing mucosal tearing or shearing. I did not see any but the ureter was bunching up around the scope. Therefore I passed a sensor wire again and back the ureteroscope out on the digital scope. I inspected the ureter on the way out and noted to be normal. Over the second wire then passed the longest access sheath opening to get up to the UPJ and allow it to dilate the proximal ureter but the longer access sheath really did not provide more proximal access. It would not advance further than the first one. Therefore I passed a digital scope again and again I could not get into the collecting system. We found the older fiberoptic ureteroscope which has a smaller diameter. I passed this into the ureter and the picture was very small and the optics  poor. I was able to pass this into the collecting system but I could barely identify the renal papilla and never saw any stones. Therefore this was removed. Again the ureter was intact and there was no trauma, but it was just very tight. I tried one last time with a digital  ureteroscope but again this would not access the collecting system. I could see up into the renal pelvis even in may have seen a stent adjacent to the wire. The ureter appeared normal just tight. The access sheath was backed out on the ureteroscope and the ureter again inspected and noted to be normal. Over the safety wire and a 6 x 28 cm ureteral stent was advanced after back loading the wire on the cystoscope. The bladder was drained and the scope removed. The urethra was infiltrated with some lidocaine jelly. The patient was awakened and taken to recovery room in stable condition.  Complications: None Blood loss: Minimal  Drains: 6 x 28 cm right ureteral stent  Specimens: None  Disposition: Patient stable to PACU. I'll have him followup in the outpatient and we'll discuss removing the stent or considering shockwave lithotripsy. I'll get KUB on followup.

## 2013-12-05 NOTE — Anesthesia Preprocedure Evaluation (Signed)
Anesthesia Evaluation  Patient identified by MRN, date of birth, ID band Patient awake    Reviewed: Allergy & Precautions, H&P , NPO status , Patient's Chart, lab work & pertinent test results  Airway Mallampati: II TM Distance: >3 FB Neck ROM: Full    Dental no notable dental hx.    Pulmonary former smoker,  breath sounds clear to auscultation  Pulmonary exam normal       Cardiovascular hypertension, Pt. on medications Rhythm:Regular Rate:Normal     Neuro/Psych negative neurological ROS  negative psych ROS   GI/Hepatic negative GI ROS, Neg liver ROS,   Endo/Other  negative endocrine ROS  Renal/GU Renal disease  negative genitourinary   Musculoskeletal negative musculoskeletal ROS (+)   Abdominal   Peds negative pediatric ROS (+)  Hematology  (+) anemia ,   Anesthesia Other Findings   Reproductive/Obstetrics negative OB ROS                           Anesthesia Physical Anesthesia Plan  ASA: II  Anesthesia Plan: General   Post-op Pain Management:    Induction: Intravenous  Airway Management Planned: LMA  Additional Equipment:   Intra-op Plan:   Post-operative Plan: Extubation in OR  Informed Consent: I have reviewed the patients History and Physical, chart, labs and discussed the procedure including the risks, benefits and alternatives for the proposed anesthesia with the patient or authorized representative who has indicated his/her understanding and acceptance.   Dental advisory given  Plan Discussed with: CRNA  Anesthesia Plan Comments:         Anesthesia Quick Evaluation

## 2013-12-05 NOTE — Transfer of Care (Signed)
Immediate Anesthesia Transfer of Care Note  Patient: Justin Huber  Procedure(s) Performed: Procedure(s): RIGHT  URETEROSCOPY RIGHT RETROGRADE AND  RIGHT STENT PLACEMENT (Right)  Patient Location: PACU  Anesthesia Type:General  Level of Consciousness: awake, alert  and patient cooperative  Airway & Oxygen Therapy: Patient Spontanous Breathing and Patient connected to face mask oxygen  Post-op Assessment: Report given to PACU RN and Post -op Vital signs reviewed and stable  Post vital signs: Reviewed and stable  Complications: No apparent anesthesia complications

## 2013-12-05 NOTE — Discharge Instructions (Signed)
Ureteral Stent Implantation, Care After Refer to this sheet in the next few weeks. These instructions provide you with information on caring for yourself after your procedure. Your health care provider may also give you more specific instructions. Your treatment has been planned according to current medical practices, but problems sometimes occur. Call your health care provider if you have any problems or questions after your procedure. WHAT TO EXPECT AFTER THE PROCEDURE You should be back to normal activity within 48 hours after the procedure. Nausea and vomiting may occur and are commonly the result of anesthesia. It is common to experience sharp pain in the back or lower abdomen and penis with voiding. This is caused by movement of the ends of the stent with the act of urinating.It usually goes away within minutes after you have stopped urinating. HOME CARE INSTRUCTIONS Make sure to drink plenty of fluids. You may have small amounts of bleeding, causing your urine to be red. This is normal. Certain movements may trigger pain or a feeling that you need to urinate. You may be given medicines to prevent infection or bladder spasms. Be sure to take all medicines as directed. Only take over-the-counter or prescription medicines for pain, discomfort, or fever as directed by your health care provider. Do not take aspirin, as this can make bleeding worse. Your stent will be left in until the blockage is resolved. This may take 2 weeks or longer, depending on the reason for stent implantation. You may have an X-ray exam to make sure your ureter is open and that the stent has not moved out of position (migrated). The stent can be removed by your health care provider in the office. Be sure to keep all follow-up appointments so your health care provider can check that you are healing properly. SEEK MEDICAL CARE IF:  You experience increasing pain.  Your pain medicine is not working. SEEK IMMEDIATE MEDICAL CARE  IF:  Your urine is dark red or has blood clots.  You are leaking urine (incontinent).  You have a fever, chills, feeling sick to your stomach (nausea), or vomiting.  Your pain is not relieved by pain medicine.  The end of the stent comes out of the urethra.  You are unable to urinate. Document Released: 11/20/2012 Document Revised: 03/25/2013 Document Reviewed: 11/20/2012 [facility] Patient Information 2015 [place], [place]. This information is not intended to replace advice given to you by your health care provider. Make sure you discuss any questions you have with your health care provider.      General Anesthesia, Care After Refer to this sheet in the next few weeks. These instructions provide you with information on caring for yourself after your procedure. Your health care provider may also give you more specific instructions. Your treatment has been planned according to current medical practices, but problems sometimes occur. Call your health care provider if you have any problems or questions after your procedure. WHAT TO EXPECT AFTER THE PROCEDURE After the procedure, it is typical to experience:  Sleepiness.  Nausea and vomiting. HOME CARE INSTRUCTIONS  For the first 24 hours after general anesthesia:  Have a responsible person with you.  Do not drive a car. If you are alone, do not take public transportation.  Do not drink alcohol.  Do not take medicine that has not been prescribed by your health care provider.  Do not sign important papers or make important decisions.  You may resume a normal diet and activities as directed by your health care  provider.  Change bandages (dressings) as directed.  If you have questions or problems that seem related to general anesthesia, call the hospital and ask for the anesthetist or anesthesiologist on call. SEEK MEDICAL CARE IF:  You have nausea and vomiting that continue the day after anesthesia.  You develop a  rash. SEEK IMMEDIATE MEDICAL CARE IF:   You have difficulty breathing.  You have chest pain.  You have any allergic problems. Document Released: 06/26/2000 Document Revised: 03/25/2013 Document Reviewed: 10/03/2012 [facility] Patient Information 2015 [place], [place]. This information is not intended to replace advice given to you by your health care provider. Make sure you discuss any questions you have with your health care provider.

## 2013-12-05 NOTE — Anesthesia Postprocedure Evaluation (Signed)
  Anesthesia Post-op Note  Patient: Justin Huber  Procedure(s) Performed: Procedure(s) (LRB): RIGHT  URETEROSCOPY RIGHT RETROGRADE AND  RIGHT STENT PLACEMENT (Right)  Patient Location: PACU  Anesthesia Type: General  Level of Consciousness: awake and alert   Airway and Oxygen Therapy: Patient Spontanous Breathing  Post-op Pain: mild  Post-op Assessment: Post-op Vital signs reviewed, Patient's Cardiovascular Status Stable, Respiratory Function Stable, Patent Airway and No signs of Nausea or vomiting  Last Vitals:  Filed Vitals:   12/05/13 1120  BP:   Pulse: 69  Temp:   Resp: 10    Post-op Vital Signs: stable   Complications: No apparent anesthesia complications

## 2013-12-09 ENCOUNTER — Encounter (HOSPITAL_COMMUNITY): Payer: Self-pay | Admitting: Urology

## 2013-12-23 ENCOUNTER — Other Ambulatory Visit: Payer: Self-pay | Admitting: Urology

## 2013-12-24 ENCOUNTER — Encounter (HOSPITAL_BASED_OUTPATIENT_CLINIC_OR_DEPARTMENT_OTHER): Payer: Self-pay | Admitting: *Deleted

## 2013-12-25 ENCOUNTER — Encounter (HOSPITAL_BASED_OUTPATIENT_CLINIC_OR_DEPARTMENT_OTHER): Payer: Self-pay | Admitting: *Deleted

## 2013-12-25 NOTE — Progress Notes (Signed)
NPO AFTER MN. ARRIVE AT 1015.  NEEDS CXR.  CURRENT LAB RESULTS AND EKG IN CHART AND EPIC.  WILL TAKE AVAPRO AM DOS W/ SIPS OF WATER.

## 2013-12-29 NOTE — Anesthesia Preprocedure Evaluation (Addendum)
Anesthesia Evaluation  Patient identified by MRN, date of birth, ID band Patient awake    Reviewed: Allergy & Precautions, H&P , NPO status , Patient's Chart, lab work & pertinent test results  Airway Mallampati: II TM Distance: >3 FB Neck ROM: Full    Dental no notable dental hx. (+)    Pulmonary neg pulmonary ROS, former smoker,  breath sounds clear to auscultation  Pulmonary exam normal       Cardiovascular hypertension, Pt. on medications Rhythm:Regular Rate:Normal     Neuro/Psych negative neurological ROS  negative psych ROS   GI/Hepatic negative GI ROS, Neg liver ROS,   Endo/Other  negative endocrine ROS  Renal/GU Renal disease  negative genitourinary   Musculoskeletal negative musculoskeletal ROS (+)   Abdominal   Peds negative pediatric ROS (+)  Hematology negative hematology ROS (+) anemia ,   Anesthesia Other Findings   Reproductive/Obstetrics negative OB ROS                          Anesthesia Physical Anesthesia Plan  ASA: II  Anesthesia Plan: General   Post-op Pain Management:    Induction: Intravenous  Airway Management Planned: LMA  Additional Equipment:   Intra-op Plan:   Post-operative Plan: Extubation in OR  Informed Consent: I have reviewed the patients History and Physical, chart, labs and discussed the procedure including the risks, benefits and alternatives for the proposed anesthesia with the patient or authorized representative who has indicated his/her understanding and acceptance.   Dental advisory given  Plan Discussed with: CRNA  Anesthesia Plan Comments:         Anesthesia Quick Evaluation

## 2013-12-30 ENCOUNTER — Ambulatory Visit (HOSPITAL_COMMUNITY): Payer: Medicare HMO

## 2013-12-30 ENCOUNTER — Encounter (HOSPITAL_BASED_OUTPATIENT_CLINIC_OR_DEPARTMENT_OTHER): Payer: Self-pay | Admitting: *Deleted

## 2013-12-30 ENCOUNTER — Ambulatory Visit (HOSPITAL_BASED_OUTPATIENT_CLINIC_OR_DEPARTMENT_OTHER): Payer: Medicare HMO | Admitting: Anesthesiology

## 2013-12-30 ENCOUNTER — Encounter (HOSPITAL_BASED_OUTPATIENT_CLINIC_OR_DEPARTMENT_OTHER)
Admission: RE | Disposition: A | Discharge status: Home / Self Care | Payer: Self-pay | Source: Ambulatory Visit | Attending: Urology

## 2013-12-30 ENCOUNTER — Encounter (HOSPITAL_BASED_OUTPATIENT_CLINIC_OR_DEPARTMENT_OTHER): Payer: Medicare HMO | Admitting: Anesthesiology

## 2013-12-30 ENCOUNTER — Ambulatory Visit (HOSPITAL_BASED_OUTPATIENT_CLINIC_OR_DEPARTMENT_OTHER)
Admission: RE | Admit: 2013-12-30 | Discharge: 2013-12-30 | Disposition: A | Discharge status: Home / Self Care | Payer: Medicare HMO | Source: Ambulatory Visit | Attending: Urology | Admitting: Urology

## 2013-12-30 DIAGNOSIS — D649 Anemia, unspecified: Secondary | ICD-10-CM | POA: Diagnosis not present

## 2013-12-30 DIAGNOSIS — Z87891 Personal history of nicotine dependence: Secondary | ICD-10-CM | POA: Diagnosis not present

## 2013-12-30 DIAGNOSIS — I10 Essential (primary) hypertension: Secondary | ICD-10-CM | POA: Diagnosis not present

## 2013-12-30 DIAGNOSIS — N200 Calculus of kidney: Secondary | ICD-10-CM | POA: Insufficient documentation

## 2013-12-30 HISTORY — PX: CYSTOSCOPY WITH URETEROSCOPY AND STENT PLACEMENT: SHX6377

## 2013-12-30 HISTORY — DX: Calculus of kidney: N20.0

## 2013-12-30 HISTORY — DX: Personal history of non-Hodgkin lymphomas: Z85.72

## 2013-12-30 HISTORY — DX: Diverticulosis of large intestine without perforation or abscess without bleeding: K57.30

## 2013-12-30 HISTORY — PX: HOLMIUM LASER APPLICATION: SHX5852

## 2013-12-30 SURGERY — CYSTOURETEROSCOPY, WITH STENT INSERTION
Anesthesia: General | Site: Ureter | Laterality: Right

## 2013-12-30 MED ORDER — KETOROLAC TROMETHAMINE 30 MG/ML IJ SOLN
INTRAMUSCULAR | Status: DC | PRN
  Administered 2013-12-30: 30 mg via INTRAVENOUS

## 2013-12-30 MED ORDER — ONDANSETRON HCL 4 MG/2ML IJ SOLN
INTRAMUSCULAR | Status: DC | PRN
  Administered 2013-12-30: 4 mg via INTRAVENOUS

## 2013-12-30 MED ORDER — MIDAZOLAM HCL 2 MG/2ML IJ SOLN
INTRAMUSCULAR | Status: AC
  Filled 2013-12-30: qty 2

## 2013-12-30 MED ORDER — IOHEXOL 350 MG/ML SOLN
INTRAVENOUS | Status: DC | PRN
  Administered 2013-12-30: 3 mL

## 2013-12-30 MED ORDER — MIDAZOLAM HCL 5 MG/5ML IJ SOLN
INTRAMUSCULAR | Status: DC | PRN
  Administered 2013-12-30: 1 mg via INTRAVENOUS

## 2013-12-30 MED ORDER — FENTANYL CITRATE 0.05 MG/ML IJ SOLN
INTRAMUSCULAR | Status: AC
  Filled 2013-12-30: qty 2

## 2013-12-30 MED ORDER — CEFAZOLIN SODIUM-DEXTROSE 2-3 GM-% IV SOLR
2.0000 g | INTRAVENOUS | Status: AC
Start: 2013-12-30 — End: 2013-12-30
  Administered 2013-12-30: 2 g via INTRAVENOUS
  Filled 2013-12-30: qty 50

## 2013-12-30 MED ORDER — SODIUM CHLORIDE 0.9 % IR SOLN
Status: DC | PRN
  Administered 2013-12-30: 7000 mL

## 2013-12-30 MED ORDER — LIDOCAINE HCL (CARDIAC) 20 MG/ML IV SOLN
INTRAVENOUS | Status: DC | PRN
  Administered 2013-12-30: 80 mg via INTRAVENOUS

## 2013-12-30 MED ORDER — DEXAMETHASONE SODIUM PHOSPHATE 4 MG/ML IJ SOLN
INTRAMUSCULAR | Status: DC | PRN
  Administered 2013-12-30: 10 mg via INTRAVENOUS

## 2013-12-30 MED ORDER — STERILE WATER FOR IRRIGATION IR SOLN
Status: DC | PRN
  Administered 2013-12-30: 500 mL via INTRAVESICAL

## 2013-12-30 MED ORDER — CEFAZOLIN SODIUM-DEXTROSE 2-3 GM-% IV SOLR
INTRAVENOUS | Status: AC
  Filled 2013-12-30: qty 50

## 2013-12-30 MED ORDER — LACTATED RINGERS IV SOLN
INTRAVENOUS | Status: DC
  Administered 2013-12-30 (×2): via INTRAVENOUS
  Filled 2013-12-30: qty 1000

## 2013-12-30 MED ORDER — CEFAZOLIN SODIUM 1-5 GM-% IV SOLN
1.0000 g | INTRAVENOUS | Status: DC
  Filled 2013-12-30: qty 50

## 2013-12-30 MED ORDER — PROMETHAZINE HCL 25 MG/ML IJ SOLN
6.2500 mg | INTRAMUSCULAR | Status: DC | PRN
  Filled 2013-12-30: qty 1

## 2013-12-30 MED ORDER — FENTANYL CITRATE 0.05 MG/ML IJ SOLN
INTRAMUSCULAR | Status: DC | PRN
  Administered 2013-12-30 (×2): 25 ug via INTRAVENOUS
  Administered 2013-12-30: 50 ug via INTRAVENOUS
  Administered 2013-12-30: 25 ug via INTRAVENOUS
  Administered 2013-12-30 (×2): 12.5 ug via INTRAVENOUS
  Administered 2013-12-30: 50 ug via INTRAVENOUS

## 2013-12-30 MED ORDER — PROPOFOL 10 MG/ML IV BOLUS
INTRAVENOUS | Status: DC | PRN
  Administered 2013-12-30: 200 mg via INTRAVENOUS

## 2013-12-30 MED ORDER — FENTANYL CITRATE 0.05 MG/ML IJ SOLN
INTRAMUSCULAR | Status: AC
  Filled 2013-12-30: qty 4

## 2013-12-30 MED ORDER — FENTANYL CITRATE 0.05 MG/ML IJ SOLN
25.0000 ug | INTRAMUSCULAR | Status: DC | PRN
  Administered 2013-12-30: 50 ug via INTRAVENOUS
  Administered 2013-12-30 (×2): 25 ug via INTRAVENOUS
  Filled 2013-12-30: qty 1

## 2013-12-30 MED ORDER — ACETAMINOPHEN 10 MG/ML IV SOLN
INTRAVENOUS | Status: DC | PRN
  Administered 2013-12-30: 1000 mg via INTRAVENOUS

## 2013-12-30 SURGICAL SUPPLY — 53 items
ADAPTER CATH URET PLST 4-6FR (CATHETERS) IMPLANT
ADPR CATH URET STRL DISP 4-6FR (CATHETERS)
BAG DRAIN URO-CYSTO SKYTR STRL (DRAIN) ×4 IMPLANT
BAG DRN UROCATH (DRAIN) ×2
BASKET LASER NITINOL 1.9FR (BASKET) IMPLANT
BASKET STNLS GEMINI 4WIRE 3FR (BASKET) IMPLANT
BASKET STONE 1.7 NGAGE (UROLOGICAL SUPPLIES) ×3 IMPLANT
BASKET ZERO TIP NITINOL 2.4FR (BASKET) ×2 IMPLANT
BSKT STON RTRVL 120 1.9FR (BASKET)
BSKT STON RTRVL GEM 120X11 3FR (BASKET)
BSKT STON RTRVL ZERO TP 2.4FR (BASKET) ×2
CANISTER SUCT LVC 12 LTR MEDI- (MISCELLANEOUS) ×3 IMPLANT
CATH INTERMIT  6FR 70CM (CATHETERS) IMPLANT
CATH URET 5FR 28IN CONE TIP (BALLOONS)
CATH URET 5FR 28IN OPEN ENDED (CATHETERS) ×3 IMPLANT
CATH URET 5FR 70CM CONE TIP (BALLOONS) IMPLANT
CATH URET DUAL LUMEN 6-10FR 50 (CATHETERS) ×3 IMPLANT
CLOTH BEACON ORANGE TIMEOUT ST (SAFETY) ×4 IMPLANT
DRAPE CAMERA CLOSED 9X96 (DRAPES) ×2 IMPLANT
ELECT REM PT RETURN 9FT ADLT (ELECTROSURGICAL)
ELECTRODE REM PT RTRN 9FT ADLT (ELECTROSURGICAL) IMPLANT
FIBER LASER TRAC TIP (UROLOGICAL SUPPLIES) ×2 IMPLANT
GLOVE BIO SURGEON STRL SZ 6.5 (GLOVE) ×2 IMPLANT
GLOVE BIO SURGEON STRL SZ7.5 (GLOVE) ×4 IMPLANT
GLOVE BIO SURGEONS STRL SZ 6.5 (GLOVE) ×2
GLOVE BIOGEL PI IND STRL 6.5 (GLOVE) IMPLANT
GLOVE BIOGEL PI INDICATOR 6.5 (GLOVE) ×2
GLOVE INDICATOR 6.5 STRL GRN (GLOVE) ×2 IMPLANT
GLOVE INDICATOR 7.5 STRL GRN (GLOVE) ×2 IMPLANT
GLOVE SURG SS PI 7.5 STRL IVOR (GLOVE) ×8 IMPLANT
GOWN PREVENTION PLUS LG XLONG (DISPOSABLE) ×1 IMPLANT
GOWN STRL REIN XL XLG (GOWN DISPOSABLE) ×1 IMPLANT
GOWN STRL REUS W/ TWL LRG LVL3 (GOWN DISPOSABLE) ×1 IMPLANT
GOWN STRL REUS W/ TWL XL LVL3 (GOWN DISPOSABLE) ×3 IMPLANT
GOWN STRL REUS W/TWL LRG LVL3 (GOWN DISPOSABLE) ×4
GOWN STRL REUS W/TWL XL LVL3 (GOWN DISPOSABLE) ×12
GUIDEWIRE 0.038 PTFE COATED (WIRE) IMPLANT
GUIDEWIRE ANG ZIPWIRE 038X150 (WIRE) IMPLANT
GUIDEWIRE STR DUAL SENSOR (WIRE) ×4 IMPLANT
IV NS 1000ML (IV SOLUTION) ×4
IV NS 1000ML BAXH (IV SOLUTION) IMPLANT
IV NS IRRIG 3000ML ARTHROMATIC (IV SOLUTION) ×8 IMPLANT
KIT BALLIN UROMAX 15FX10 (LABEL) IMPLANT
KIT BALLN UROMAX 15FX4 (MISCELLANEOUS) IMPLANT
KIT BALLN UROMAX 26 75X4 (MISCELLANEOUS)
PACK CYSTO (CUSTOM PROCEDURE TRAY) ×4 IMPLANT
SET HIGH PRES BAL DIL (LABEL)
SHEATH ACCESS URETERAL 38CM (SHEATH) IMPLANT
SHEATH ACCESS URETERAL 54CM (SHEATH) ×3 IMPLANT
SHEATH URET ACCESS 12FR/35CM (UROLOGICAL SUPPLIES) IMPLANT
SHEATH URET ACCESS 12FR/55CM (UROLOGICAL SUPPLIES) IMPLANT
STENT URET 6FRX28 CONTOUR (STENTS) ×2 IMPLANT
SYRINGE IRR TOOMEY STRL 70CC (SYRINGE) IMPLANT

## 2013-12-30 NOTE — Transfer of Care (Signed)
Immediate Anesthesia Transfer of Care Note  Patient: Justin Huber  Procedure(s) Performed: Procedure(s) (LRB): RIGHT  URETEROSCOPY AND STENT REMOVAL AND PLACEMENT (Right) HOLMIUM LASER  (N/A)  Patient Location: PACU  Anesthesia Type: General  Level of Consciousness: awake, alert  and oriented  Airway & Oxygen Therapy: Patient Spontanous Breathing and Patient connected to face mask oxygen  Post-op Assessment: Report given to PACU RN and Post -op Vital signs reviewed and stable  Post vital signs: Reviewed and stable  Complications: No apparent anesthesia complications

## 2013-12-30 NOTE — Anesthesia Procedure Notes (Addendum)
Procedure Name: LMA Insertion Date/Time: 12/30/2013 11:43 AM Performed by: Mechele Claude Pre-anesthesia Checklist: Patient identified, Emergency Drugs available, Suction available and Patient being monitored Patient Re-evaluated:Patient Re-evaluated prior to inductionOxygen Delivery Method: Circle System Utilized Preoxygenation: Pre-oxygenation with 100% oxygen Intubation Type: IV induction Ventilation: Mask ventilation without difficulty LMA: LMA inserted LMA Size: 5.0 Number of attempts: 1 Airway Equipment and Method: bite block Placement Confirmation: positive ETCO2 Tube secured with: Tape Dental Injury: Teeth and Oropharynx as per pre-operative assessment

## 2013-12-30 NOTE — Op Note (Signed)
Preoperative diagnosis: Right nephrolithiasis Postoperative diagnosis: Right nephrolithiasis  Procedure: Right ureteroscopy, holmium laser lithotripsy, stone basket extraction, right ureteral stent exchange/ placement  Surgeon: Ulys Favia   Type of anesthesia: Gen.   Findings:  5 stones in the right kidney including the tiny Randles plaque noted in the upper pole anterior. I was able to get the small plaque as well as the midpole, renal pelvis and the larger lower pole stone (total of 4). A small stone in the lower pole slipped under the edge of the lower pole calyx and could not be flushed out.   Ureter is narrowed proximally but the new digital scope perform flawlessly. Once the access sheath was removed the ureter was noted to be normal without injury or stone.   Description of procedure: After consent was obtained patient brought the operating room. After adequate anesthesia he is placed in lithotomy position and prepped and draped in the usual sterile fashion. A timeout was performed to confirm the patient and procedure. A cystoscope was passed per urethra and the right ureteral stent grasped and removed through the urethral meatus. Through this a sensor wire was advanced and coiled in the collecting system. A dual-lumen exchange catheter was then advanced and partial retrograde pyelogram was obtained. It should be normal but somewhat narrow ureter and normal collecting system. There were no filling defects in the ureter. A glide wire was advanced and cold in the collecting system. A dual-lumen was removed. A dual-lumen advanced easily. Over the sensor wire a ureteral access sheath was advanced into the proximal ureter. Digital ureteroscope was advanced into the collecting system. The stone was located in the renal pelvis similar to the CT. It was grasped to was too large to remove through the small proximal ureter. Therefore it was placed in the upper pole calyx and broken into 2 large pieces With  the holmium laser at setting of 0.8 and 8. These 2 large fragments were removed. There were some other fragments that were insignificant. As seen on the CT there is a tiny anterior Randles plaque which was hit with the laser and it washed off the Papilla. it was too small to be grasped. The stone in the midpole was located and removed with the Nitinol basket. Next the lower pole was located and it was at full retroflex on the scope. Then the stones were progressing more anteriorly and up under the lip of the calyx and were extremely difficult to maneuver and removed. The larger of the stones was flushed out of the lower pole into the renal pelvis and then grasped with an engage basket. I had called a glucose of the smaller lower pole stone and went back down and flushed repeatedly but could not get it to come out. I carefully inspected the middle and upper pole calyces and could not find it. There were no other significant stones or stone fragments noted. The access sheath was backed out on the ureteroscope and the ureter carefully inspected . There were no fragments or injury of the ureter. Given the access sheath placement as well as the sequential stone removal I decided to leave a new stent. The Glidewire was backloaded on the cystoscope and a 6 x 28 cm stent was advanced. The wire was removed with a good coil seen in the kidney and a good coil in the bladder. The bladder was drained and the scope removed. The patient was awakened and taken to recovery room in stable condition.   Complications: None  Blood loss: Minimal   Drains: 6 x 28 cm right ureteral stent   Specimens: Stones to office lab   Disposition: Patient stable to PACU.

## 2013-12-30 NOTE — H&P (Signed)
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 Sept 2014 Cysto normal at [facility], Dr. Dimas Millin.     2- infected lumphocele - Aug 2015 - presented with fever, irritative voiding symptoms. CT A/P - fluid collection in the left pelvis, adjacent to bladder (confirmed with cystogram). A perc drain was placed and grew bacteroides fragilis + beta lactamase. Cytology confirmed abundant acute inflammation, no malignancy. Tx with clinda, Augmentin.     2- Nephrolithiasis - pt with bilateral small stones. Prior ESWL. Stented Aug 2015 for fever and proximal right ureteral stone.     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 No luts once lymphocele drained and abx started. S/p bladder diverticulectomy at [facility]. His final PVR in hospital was 0. Mild BPH on exam and cystoscopy. He was started on tamsulosin.    4- Pca screening -   -Dec 2013 PSA 1.36   -Aug 2015 normal DRE/EUA         Sept 2015 interval history  The patient was seen by IR earlier this month and underwent fistulogram which showed no connection to the bowel or bladder. The drain was removed.     Patient was taken earlier this month for cystoscopy right ureteroscopy. Cystoscopy was normal. The bladder inflammation was almost completely resolved after draining the lymphocele. The right proximal ureter was too narrow to accept the ureteroscope and would not allow dilation with the access sheath or dual lumen.    He returns today and is doing well. He is voiding without difficulty he's had no fevers or dysuria. He has some mild right flank pain on occasion when he voids.     Past Medical History Problems  1. History of Cancer (199.1) 2. History of hypercholesterolemia (V12.29) 3. History of hypertension  (V12.59) 4. History of kidney stones (V13.01)  Surgical History Problems  1. History of Cystoscopy (Diagnostic) 2. History of Cystoscopy Bladder Tumor (596.9) 3. History of Cystoscopy With Fragmentation Of Bladder Calculus 4. History of Cystoscopy With Insertion Of Ureteral Stent Right 5. History of Cystoscopy With Insertion Of Ureteral Stent Right 6. History of Cystoscopy With Ureteroscopy 7. History of Cystoscopy With Ureteroscopy Right 8. History of Lithotripsy 9. History of Neck Surgery 10. History of Repair Of Forearm 11. History of Rhinoplasty  Current Meds 1. Aspirin 81 MG Oral Tablet;  Therapy: (Recorded:31Oct2011) to Recorded 2. Avapro 300 MG Oral Tablet;  Therapy: (Recorded:18Sep2015) to Recorded 3. Hydrochlorothiazide CAPS;  Therapy: (Recorded:31Oct2011) to Recorded 4. Lipitor 10 MG Oral Tablet;  Therapy: (Recorded:31Oct2011) to Recorded  Allergies Medication  1. No Known Drug Allergies  Family History Problems  1. Family history of Family Health Status - Mother's Age   93 2. Family history of Family Health Status Number Of Children   2 SONS 3. Family history of Nephrolithiasis 4. No pertinent family history : Mother  Social History Problems  1. Alcohol Use   2-3 per wk 2. Caffeine Use   3 PER DAY 3. Family history of Death In The Family Father   AGE 69 - [place] 4. Former smoker (V15.82) 5. Marital History - Currently Married 6. Occupation:   [employer] OF [employer]PERATIONS 7. Tobacco Use (V15.82)   LESS THAN 1 PACK PER DAYSMOKED FOR 15 YEARSQUIT 15 YEARS AGO  Vitals  Vital Signs [Data Includes: Last 1 Day]  Recorded: 18Sep2015 01:13PM  Weight: 216 lb  BMI Calculated: 28.5 BSA Calculated: 2.22 Blood Pressure: 131 / 80 Temperature: 98.5 F Heart Rate: 72  Results/Data Urine [Data Includes: Last 1 Day]   18Sep2015  COLOR YELLOW   APPEARANCE CLOUDY   SPECIFIC GRAVITY 1.015   pH 6.0   GLUCOSE NEG mg/dL  BILIRUBIN NEG   KETONE NEG mg/dL   BLOOD LARGE   PROTEIN 30 mg/dL  UROBILINOGEN 0.2 mg/dL  NITRITE NEG   LEUKOCYTE ESTERASE SMALL   SQUAMOUS EPITHELIAL/HPF RARE   WBC 3-6 WBC/hpf  RBC TNTC RBC/hpf  BACTERIA RARE   CRYSTALS NONE SEEN   CASTS Hyaline casts noted   Other MUCUS NOTED    The following images/tracing/specimen were independently visualized:  IR fistulogram.    Procedure KUB today- the bones and the bowel gas pattern appear normal. 2 small left renal stones, 2 small right renal stones. I believe the other stone may still be along the proximal stent. Right Stent appears in good position.   Assessment Assessed  1. Bilateral kidney stones (592.0)  Plan Bilateral kidney stones  1. URINE CULTURE; Status:Hold For - Specimen/Data Collection,Appointment; Requested  for:18Sep2015; 1  2. Follow-up Schedule Surgery Office  Follow-up  Status: Hold For - Appointment   Requested for: 18Sep2015 Health Maintenance  3. UA With REFLEX; [Do Not Release]; Status:Complete;   Done: [id number] 01:00PM   1 Amended By: Festus Aloe; Dec 19 2013 4:05 PM EST  Discussion/Summary Nephrolithiasis - discussed the nature risk and benefits of removing the stent, attempting ureteroscopy again and if unsuccessful inserting a larger stent, or shockwave lithotripsy. All questions answered. He elects to proceed with attempted ureteroscopy. I do believe this is his best chance of getting all the stones. We just have to balance aggressiveness with urethral manipulation and dilation with risk of injury and further scar tissue / stricturing. All questions answered and he elects to proceed.   Signatures Electronically signed by : Festus Aloe, M.D.; Dec 19 2013  4:05PM EST   ADD: I set patient up in the surgery center with the new Olympus URS-scope which has a narrow and straight tip. Pam requested small [employer] but admin said [place] equipment was only here during a trial period, could not come in for the procedure.

## 2013-12-30 NOTE — Discharge Instructions (Addendum)
Cystoscopy, Care After Refer to this sheet in the next few weeks. These instructions provide you with information on caring for yourself after your procedure. Your caregiver may also give you more specific instructions. Your treatment has been planned according to current medical practices, but problems sometimes occur. Call your caregiver if you have any problems or questions after your procedure. HOME CARE INSTRUCTIONS  Things you can do to ease any discomfort after your procedure include:  Drinking enough water and fluids to keep your urine clear or pale yellow.  Taking a warm bath to relieve any burning feelings. SEEK IMMEDIATE MEDICAL CARE IF:   You have an increase in blood in your urine.  You notice blood clots in your urine.  You have difficulty passing urine.  You have the chills.  You have abdominal pain.  You have a fever or persistent symptoms for more than 2-3 days.  You have a fever and your symptoms suddenly get worse. MAKE SURE YOU:   Understand these instructions.  Will watch your condition.  Will get help right away if you are not doing well or get worse. Document Released: 10/07/2004 Document Revised: 11/20/2012 Document Reviewed: 09/11/2011 [facility] Patient Information 2015 [place], [place]. This information is not intended to replace advice given to you by your health care provider. Make sure you discuss any questions you have with your health care provider.  Post Anesthesia Home Care Instructions  Activity: Get plenty of rest for the remainder of the day. A responsible adult should stay with you for 24 hours following the procedure.  For the next 24 hours, DO NOT: -Drive a car -Paediatric nurse -Drink alcoholic beverages -Take any medication unless instructed by your physician -Make any legal decisions or sign important papers.  Meals: Start with liquid foods such as gelatin or soup. Progress to regular foods as tolerated. Avoid greasy, spicy, heavy  foods. If nausea and/or vomiting occur, drink only clear liquids until the nausea and/or vomiting subsides. Call your physician if vomiting continues.  Special Instructions/Symptoms: Your throat may feel dry or sore from the anesthesia or the breathing tube placed in your throat during surgery. If this causes discomfort, gargle with warm salt water. The discomfort should disappear within 24 hours. [facility] [phone] Post Ureteroscopy With or Without Stent Instructions  Definitions:  Ureter: The duct that transports urine from the kidney to the bladder. Stent:   A plastic hollow tube that is placed into the ureter, from the kidney to the                 bladder to prevent the ureter from swelling shut.  GENERAL INSTRUCTIONS:  Despite the fact that no skin incisions were used, the area around the ureter and bladder is raw and irritated. The stent is a foreign body which will further irritate the bladder wall. This irritation is manifested by increased frequency of urination, both day and night, and by an increase in the urge to urinate. In some, the urge to urinate is present almost always. Sometimes the urge is strong enough that you may not be able to stop yourself from urinating. The only real cure is to remove the stent and then give time for the bladder wall to heal which can't be done until the danger of the ureter swelling shut has passed, which varies.  You may see some blood in your urine while the stent is in place and a few days afterwards. Do not be alarmed, even if the urine  was clear for a while. Get off your feet and drink lots of fluids until clearing occurs. If you start to pass clots or don't improve, call us.  DIET: You may return to your normal diet immediately. Because of the raw surface of your bladder, alcohol, spicy foods, acid type foods and drinks with caffeine may cause irritation or frequency and should be used in moderation. To keep your urine  flowing freely and to avoid constipation, drink plenty of fluids during the day ( 8-10 glasses ). Tip: Avoid cranberry juice because it is very acidic.  ACTIVITY: Your physical activity doesn't need to be restricted. However, if you are very active, you may see some blood in your urine. We suggest that you reduce your activity under these circumstances until the bleeding has stopped.  BOWELS: It is important to keep your bowels regular during the postoperative period. Straining with bowel movements can cause bleeding. A bowel movement every other day is reasonable. Use a mild laxative if needed, such as Milk of Magnesia 2-3 tablespoons, or 2 Dulcolax tablets. Call if you continue to have problems. If you have been taking narcotics for pain, before, during or after your surgery, you may be constipated. Take a laxative if necessary.   MEDICATION: You should resume your pre-surgery medications unless told not to. In addition you will often be given an antibiotic to prevent infection. These should be taken as prescribed until the bottles are finished unless you are having an unusual reaction to one of the drugs.  PROBLEMS YOU SHOULD REPORT TO [place]:  Fevers over 100.5 Fahrenheit.  Heavy bleeding, or clots ( See above notes about blood in urine ).  Inability to urinate.  Drug reactions ( hives, rash, nausea, vomiting, diarrhea ).  Severe burning or pain with urination that is not improving.  FOLLOW-UP: You will need a follow-up appointment to monitor your progress. Call for this appointment at the number listed above. Usually the first appointment will be about three to fourteen days after your surgery.

## 2013-12-30 NOTE — Interval H&P Note (Signed)
History and Physical Interval Note:  12/30/2013 11:01 AM  Justin Huber  has presented today for surgery, with the diagnosis of RIGHT URETERAL AND RENAL STONES  The various methods of treatment have been discussed with the patient and family. After consideration of risks, benefits and other options for treatment, the patient has consented to  Procedure(s): RIGHT  URETEROSCOPY AND STENT PLACEMENT (Right) HOLMIUM LASER  (N/A) as a surgical intervention .  The patient's history has been reviewed, patient examined, no change in status, stable for surgery.  I have reviewed the patient's chart and labs.  Questions were answered to the patient's satisfaction. Pt without complaints, No dysuria or gross hematuria. We discussed side effects of the proposed treatment, the likelihood of the patient achieving the goals of the procedure, and any potential problems that might occur during the procedure or recuperation. All questions answered. Patient elects to proceed. Will plan 7Fr stent if URS access denied.    Festus Aloe

## 2013-12-31 ENCOUNTER — Encounter (HOSPITAL_BASED_OUTPATIENT_CLINIC_OR_DEPARTMENT_OTHER): Payer: Self-pay | Admitting: Urology

## 2013-12-31 NOTE — Anesthesia Postprocedure Evaluation (Signed)
  Anesthesia Post-op Note  Patient: Justin Huber  Procedure(s) Performed: Procedure(s) (LRB): RIGHT  URETEROSCOPY AND STENT REMOVAL AND PLACEMENT (Right) HOLMIUM LASER  (N/A)  Patient Location: PACU  Anesthesia Type: General  Level of Consciousness: awake and alert   Airway and Oxygen Therapy: Patient Spontanous Breathing  Post-op Pain: mild  Post-op Assessment: Post-op Vital signs reviewed, Patient's Cardiovascular Status Stable, Respiratory Function Stable, Patent Airway and No signs of Nausea or vomiting  Last Vitals:  Filed Vitals:   12/30/13 1550  BP: 151/81  Pulse: 68  Temp: 36.4 C  Resp: 18    Post-op Vital Signs: stable   Complications: No apparent anesthesia complications

## 2014-01-29 ENCOUNTER — Other Ambulatory Visit: Payer: Self-pay | Admitting: Physician Assistant

## 2014-09-28 ENCOUNTER — Other Ambulatory Visit: Payer: Self-pay

## 2015-01-29 ENCOUNTER — Other Ambulatory Visit: Payer: Self-pay | Admitting: *Deleted

## 2015-01-29 ENCOUNTER — Ambulatory Visit (HOSPITAL_COMMUNITY)
Admission: RE | Admit: 2015-01-29 | Discharge: 2015-01-29 | Disposition: A | Discharge status: Home / Self Care | Payer: Medicare HMO | Source: Ambulatory Visit | Attending: Family Medicine | Admitting: Family Medicine

## 2015-01-29 ENCOUNTER — Other Ambulatory Visit (HOSPITAL_COMMUNITY): Payer: Self-pay | Admitting: Family Medicine

## 2015-01-29 DIAGNOSIS — M545 Low back pain: Secondary | ICD-10-CM

## 2015-01-29 DIAGNOSIS — I700 Atherosclerosis of aorta: Secondary | ICD-10-CM | POA: Insufficient documentation

## 2015-01-29 DIAGNOSIS — M25551 Pain in right hip: Secondary | ICD-10-CM | POA: Diagnosis present

## 2015-01-29 DIAGNOSIS — M25552 Pain in left hip: Secondary | ICD-10-CM | POA: Insufficient documentation

## 2015-02-16 ENCOUNTER — Other Ambulatory Visit: Payer: Self-pay | Admitting: Physician Assistant

## 2015-02-17 ENCOUNTER — Other Ambulatory Visit: Payer: Self-pay | Admitting: Urology

## 2015-02-17 ENCOUNTER — Other Ambulatory Visit (HOSPITAL_COMMUNITY): Payer: Self-pay | Admitting: Family Medicine

## 2015-02-17 DIAGNOSIS — M5430 Sciatica, unspecified side: Secondary | ICD-10-CM

## 2015-02-26 ENCOUNTER — Ambulatory Visit (HOSPITAL_COMMUNITY)
Admission: RE | Admit: 2015-02-26 | Discharge: 2015-02-26 | Disposition: A | Discharge status: Home / Self Care | Payer: Medicare HMO | Source: Ambulatory Visit | Attending: Family Medicine | Admitting: Family Medicine

## 2015-02-26 DIAGNOSIS — M5136 Other intervertebral disc degeneration, lumbar region: Secondary | ICD-10-CM | POA: Diagnosis not present

## 2015-02-26 DIAGNOSIS — M4806 Spinal stenosis, lumbar region: Secondary | ICD-10-CM | POA: Insufficient documentation

## 2015-02-26 DIAGNOSIS — M5430 Sciatica, unspecified side: Secondary | ICD-10-CM | POA: Diagnosis present

## 2015-03-04 ENCOUNTER — Encounter (HOSPITAL_BASED_OUTPATIENT_CLINIC_OR_DEPARTMENT_OTHER): Payer: Self-pay | Admitting: *Deleted

## 2015-03-04 NOTE — Progress Notes (Signed)
NPO AFTER MN.  ARRIVE AT 0600. NEEDS ISTAT AND EKG.  WILL TAKE AVAPRO AM DOS W/ SIPS OF WATER.

## 2015-03-08 NOTE — H&P (Signed)
History of Present Illness    F/u -     1 Nephrolithiasis - pt with bilateral small stones. Prior ESWL. Stones are calcium oxalate.   -Aug 2015 - stented for fever and proximal right ureteral stone.   -Sep - Oct 2015 taking for staged right ureteroscopy. Stent removed in office.     2-bladder ca - pt with h/o of possible Ta bladder ca (?dx in [place]) and f/u bladder diverticulectomy and pelvic lymphadenectomy at [facility].   -Sept 2014 Cysto normal at [facility], Dr. Dimas Millin.   -Sep 2015 - Cystoscopy in the OR benign.  -Nov 2015 - cystoscopy normal - [facility]     3- infected lymphocele - Aug 2015 - presented with fever, irritative voiding symptoms. CT A/P - fluid collection in the left pelvis, adjacent to bladder (confirmed with cystogram). A perc drain was placed and grew bacteroides fragilis + beta lactamase. Cytology confirmed abundant acute inflammation, no malignancy. Tx with clinda, Augmentin.     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 No luts once lymphocele drained and abx started. S/p bladder diverticulectomy at [facility]. His final PVR in hospital was 0. Mild BPH on exam and cystoscopy. He was started on tamsulosin.  -Nov 2015 - Ultrasound -- 2 possible stones in the right kidney one in the left. No hydronephrosis. Bladder with 424 mL. Bladder diverticulum not distended.     5- Pca screening -   -Dec 2013 PSA 1.36   -Aug 2015 normal DRE/EUA         November 2016 interval history  Patient returns for continued management of incomplete bladder emptying, bladder diverticulum, kidney stones and history of bladder cancer. Current PSA 1.25.     He has been voiding well. No dysuria or gross hematuria. No flank pain.    He has a new issue today which is erectile dysfunction. He has some trouble getting and maintaining erection. Libido is good. He started Viagra June 2016. He  tried 100 mg with good results and 50 mg with a variable result. The side effects. He has no chest pain and does not use nitroglycerin. He and his wife both coronal diets and lost a lot of weight. He is in good shape. Throughout the process they renewed their sexual interest.   Past Medical History Problems  1. History of Cancer 2. History of hypercholesterolemia (Z86.39) 3. History of hypertension (Z86.79) 4. History of kidney stones [id number])  Surgical History Problems  1. History of Cystoscopy Bladder Tumor 2. History of Cystoscopy With Fragmentation Of Bladder Calculus 3. History of Cystoscopy With Insertion Of Ureteral Stent Right 4. History of Cystoscopy With Insertion Of Ureteral Stent Right 5. History of Cystoscopy With Insertion Of Ureteral Stent Right 6. History of Cystoscopy With Ureteroscopy 7. History of Cystoscopy With Ureteroscopy Right 8. History of Cystoscopy With Ureteroscopy With Lithotripsy 9. History of Diagnostic Cystoscopy 10. History of Neck Surgery 11. History of Renal Lithotripsy 12. History of Repair Of Forearm 13. History of Rhinoplasty  Current Meds 1. Aspirin 81 MG TABS;  Therapy: (Recorded:31Oct2011) to Recorded 2. Avapro 300 MG Oral Tablet;  Therapy: (Recorded:18Sep2015) to Recorded 3. Hydrochlorothiazide CAPS;  Therapy: (Recorded:31Oct2011) to Recorded 4. Mobic 7.5 MG Oral Tablet;  Therapy: (Recorded:14Jun2016) to Recorded 5. Viagra 100 MG Oral Tablet; TAKE 1 TABLET DAILY 1 HOUR BEFORE NEEDED;  Therapy: [id number] to (Evaluate:25Sep2016); Last Rx:13Sep2016; Status: ACTIVE -  Retrospective Authorization Ordered  6. Vitamin B-6 TABS;  Therapy: (Recorded:16Nov2016) to Recorded 7. Vitamin D TABS;  Therapy: (Recorded:16Nov2016) to Recorded  Allergies Medication  1. No Known Drug Allergies  Family History Problems  1. Family history of Family Health Status - Mother's Age   66 2. Family history of Family Health Status Number Of Children   2  SONS 3. Family history of Nephrolithiasis 4. No pertinent family history : Mother  Social History Problems  1. Alcohol Use (History)   2-3 per wk 2. Caffeine Use   3 PER DAY 3. Family history of Death In The Family Father   AGE 67 - [place] 4. Former smoker [phone]) 5. Marital History - Currently Married 6. Occupation:   VP OF OPERATIONS 7. Tobacco Use   LESS THAN 1 PACK PER DAYSMOKED FOR 15 YEARSQUIT 15 YEARS AGO  Vitals Vital Signs [Data Includes: Last 1 Day]  Recorded: [id number] 11:18AM  Weight: 172 lb  BMI Calculated: 22.69 BSA Calculated: 2.02 Blood Pressure: 143 / 86 Heart Rate: 58  Physical Exam Constitutional: Well nourished and well developed . No acute distress.  ENT:. The ears and nose are normal in appearance.  Neck: The appearance of the neck is normal and no neck mass is present.  Pulmonary: No respiratory distress and normal respiratory rhythm and effort.  Cardiovascular: Heart rate and rhythm are normal . No peripheral edema.  Abdomen: The abdomen is soft and nontender. No masses are palpated. No CVA tenderness. No hernias are palpable. No hepatosplenomegaly noted.  Rectal: Rectal exam demonstrates normal sphincter tone, no tenderness and no masses. The prostate has no nodularity and is not tender. The left seminal vesicle is nonpalpable. The right seminal vesicle is nonpalpable. The perineum is normal on inspection.  Genitourinary: Examination of the penis demonstrates no discharge, no masses, no lesions and a normal meatus. The scrotum is without lesions. The right epididymis is palpably normal and non-tender. The left epididymis is palpably normal and non-tender. The right testis is non-tender and without masses. The left testis is non-tender and without masses.  Lymphatics: The femoral and inguinal nodes are not enlarged or tender.  Skin: Normal skin turgor, no visible rash and no visible skin lesions.  Neuro/Psych:. Mood and affect are appropriate.     Results/Data Urine [Data Includes: Last 1 Day]   [id number]  COLOR YELLOW   APPEARANCE CLEAR   SPECIFIC GRAVITY 1.015   pH 5.0   GLUCOSE NEGATIVE   BILIRUBIN NEGATIVE   KETONE NEGATIVE   BLOOD NEGATIVE   PROTEIN NEGATIVE   NITRITE NEGATIVE   LEUKOCYTE ESTERASE NEGATIVE    Procedure  Procedure: Cystoscopy   Informed Consent: Risks, benefits, and potential adverse events were discussed and informed consent was obtained from the patient.  Prep: The patient was prepped with betadine.  Antibiotic prophylaxis: Ciprofloxacin.  Procedure Note:  Urethral meatus:. No abnormalities.  Anterior urethra: No abnormalities.  Prostatic urethra: No abnormalities.  Bladder: Visulization was clear. The ureteral orifices had clear efflux of urine. Examination of the bladder demonstrated erythematous mucosa located on the posterior aspect of the bladder . A papillary tumor was seen in the bladder. This tumor was located on the posterior aspect of the bladder. Small early recurrence. The patient tolerated the procedure well.  Complications: None.    Assessment Assessed  1. 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 Benign localized prostatic hyperplasia with lower urinary tract symptoms (LUTS) (N40.1) 3. Other retention of urine (R33.8) 4. Bilateral kidney stones (N20.0) 5.  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 UA With REFLEX; [Do Not Release]; Status:Complete;   [id number] 10:08AM Malignant neoplasm of lateral wall of urinary bladder  2. AU CT-HEMATURIA PROTOCOL; Status:Hold For - Appointment,PreCert,Date of  Service,Print; Requested for:16Nov2016;  3. BUN & CREATININE; Status:Hold For - Specimen/Data Collection,Appointment;  Requested for:16Nov2016;   Discussion/Summary   bladder cancer - given small recurrence and history will eval kidneys (stones, cysts) and ureters with CT hematuria. Needs biopsy and  fulguration of these areas and closer inspection of the right diverticulum. We discussed the nature risk and benefits of cystoscopy and biopsy and he elects to proceed.   BPH, incomplete emptying - stable. PVR a bit lower this year.    Nephrolithiasis - no symptoms of stone but I possible increase in stone size on U/s - We will evaluate with CT scan as above.  renal cyst - new areas of hypoechogenicity in the left kidney and will evaluate as above.    ED - discussed the also label use of generic sildenafil. He will consider.       Signatures Electronically signed by : Festus Aloe, M.D.; Feb 17 2015 12:43PM EST  Add: CT hematuria benign - no fluid collections. Right tic not very full.

## 2015-03-09 ENCOUNTER — Encounter (HOSPITAL_BASED_OUTPATIENT_CLINIC_OR_DEPARTMENT_OTHER): Payer: Self-pay | Admitting: Anesthesiology

## 2015-03-09 ENCOUNTER — Encounter (HOSPITAL_BASED_OUTPATIENT_CLINIC_OR_DEPARTMENT_OTHER)
Admission: RE | Disposition: A | Discharge status: Home / Self Care | Payer: Self-pay | Source: Ambulatory Visit | Attending: Urology

## 2015-03-09 ENCOUNTER — Ambulatory Visit (HOSPITAL_BASED_OUTPATIENT_CLINIC_OR_DEPARTMENT_OTHER): Payer: Medicare HMO | Admitting: Anesthesiology

## 2015-03-09 ENCOUNTER — Ambulatory Visit (HOSPITAL_BASED_OUTPATIENT_CLINIC_OR_DEPARTMENT_OTHER)
Admission: RE | Admit: 2015-03-09 | Discharge: 2015-03-09 | Disposition: A | Discharge status: Home / Self Care | Payer: Medicare HMO | Source: Ambulatory Visit | Attending: Urology | Admitting: Urology

## 2015-03-09 DIAGNOSIS — N3090 Cystitis, unspecified without hematuria: Secondary | ICD-10-CM | POA: Insufficient documentation

## 2015-03-09 DIAGNOSIS — M4806 Spinal stenosis, lumbar region: Secondary | ICD-10-CM | POA: Insufficient documentation

## 2015-03-09 DIAGNOSIS — N401 Enlarged prostate with lower urinary tract symptoms: Secondary | ICD-10-CM | POA: Diagnosis not present

## 2015-03-09 DIAGNOSIS — D090 Carcinoma in situ of bladder: Secondary | ICD-10-CM | POA: Diagnosis not present

## 2015-03-09 DIAGNOSIS — R338 Other retention of urine: Secondary | ICD-10-CM | POA: Diagnosis not present

## 2015-03-09 DIAGNOSIS — Z791 Long term (current) use of non-steroidal anti-inflammatories (NSAID): Secondary | ICD-10-CM | POA: Diagnosis not present

## 2015-03-09 DIAGNOSIS — E7800 Pure hypercholesterolemia, unspecified: Secondary | ICD-10-CM | POA: Diagnosis not present

## 2015-03-09 DIAGNOSIS — Z87891 Personal history of nicotine dependence: Secondary | ICD-10-CM | POA: Diagnosis not present

## 2015-03-09 DIAGNOSIS — R3914 Feeling of incomplete bladder emptying: Secondary | ICD-10-CM | POA: Diagnosis not present

## 2015-03-09 DIAGNOSIS — Z79899 Other long term (current) drug therapy: Secondary | ICD-10-CM | POA: Insufficient documentation

## 2015-03-09 DIAGNOSIS — N281 Cyst of kidney, acquired: Secondary | ICD-10-CM | POA: Insufficient documentation

## 2015-03-09 DIAGNOSIS — Z841 Family history of disorders of kidney and ureter: Secondary | ICD-10-CM | POA: Insufficient documentation

## 2015-03-09 DIAGNOSIS — Z7982 Long term (current) use of aspirin: Secondary | ICD-10-CM | POA: Insufficient documentation

## 2015-03-09 DIAGNOSIS — N200 Calculus of kidney: Secondary | ICD-10-CM | POA: Diagnosis not present

## 2015-03-09 DIAGNOSIS — N5201 Erectile dysfunction due to arterial insufficiency: Secondary | ICD-10-CM | POA: Diagnosis not present

## 2015-03-09 DIAGNOSIS — I10 Essential (primary) hypertension: Secondary | ICD-10-CM | POA: Insufficient documentation

## 2015-03-09 DIAGNOSIS — D414 Neoplasm of uncertain behavior of bladder: Secondary | ICD-10-CM | POA: Diagnosis present

## 2015-03-09 DIAGNOSIS — Z87442 Personal history of urinary calculi: Secondary | ICD-10-CM | POA: Diagnosis not present

## 2015-03-09 HISTORY — DX: Spinal stenosis, lumbar region without neurogenic claudication: M48.061

## 2015-03-09 HISTORY — PX: CYSTOSCOPY WITH BIOPSY: SHX5122

## 2015-03-09 HISTORY — DX: Malignant neoplasm of bladder, unspecified: C67.9

## 2015-03-09 LAB — POCT I-STAT 4, (NA,K, GLUC, HGB,HCT)
Glucose, Bld: 104 mg/dL — ABNORMAL HIGH (ref 65–99)
HCT: 40 % (ref 39.0–52.0)
Hemoglobin: 13.6 g/dL (ref 13.0–17.0)
POTASSIUM: 3.5 mmol/L (ref 3.5–5.1)
SODIUM: 142 mmol/L (ref 135–145)

## 2015-03-09 SURGERY — CYSTOSCOPY, WITH BIOPSY
Anesthesia: General

## 2015-03-09 MED ORDER — DEXAMETHASONE SODIUM PHOSPHATE 10 MG/ML IJ SOLN
INTRAMUSCULAR | Status: DC | PRN
  Administered 2015-03-09: 10 mg via INTRAVENOUS

## 2015-03-09 MED ORDER — LIDOCAINE HCL (CARDIAC) 20 MG/ML IV SOLN
INTRAVENOUS | Status: DC | PRN
  Administered 2015-03-09: 80 mg via INTRAVENOUS

## 2015-03-09 MED ORDER — FENTANYL CITRATE (PF) 100 MCG/2ML IJ SOLN
INTRAMUSCULAR | Status: AC
  Filled 2015-03-09: qty 4

## 2015-03-09 MED ORDER — ARTIFICIAL TEARS OP OINT
TOPICAL_OINTMENT | OPHTHALMIC | Status: AC
  Filled 2015-03-09: qty 10.5

## 2015-03-09 MED ORDER — ONDANSETRON HCL 4 MG/2ML IJ SOLN
INTRAMUSCULAR | Status: AC
  Filled 2015-03-09: qty 2

## 2015-03-09 MED ORDER — CEFAZOLIN SODIUM-DEXTROSE 2-3 GM-% IV SOLR
2.0000 g | INTRAVENOUS | Status: AC
  Administered 2015-03-09: 2 g via INTRAVENOUS
  Filled 2015-03-09: qty 50

## 2015-03-09 MED ORDER — DEXAMETHASONE SODIUM PHOSPHATE 10 MG/ML IJ SOLN
INTRAMUSCULAR | Status: AC
  Filled 2015-03-09: qty 1

## 2015-03-09 MED ORDER — EPHEDRINE SULFATE 50 MG/ML IJ SOLN
INTRAMUSCULAR | Status: AC
  Filled 2015-03-09: qty 2

## 2015-03-09 MED ORDER — LIDOCAINE HCL (CARDIAC) 20 MG/ML IV SOLN
INTRAVENOUS | Status: AC
  Filled 2015-03-09: qty 5

## 2015-03-09 MED ORDER — FENTANYL CITRATE (PF) 100 MCG/2ML IJ SOLN
INTRAMUSCULAR | Status: DC | PRN
  Administered 2015-03-09: 50 ug via INTRAVENOUS

## 2015-03-09 MED ORDER — KETOROLAC TROMETHAMINE 30 MG/ML IJ SOLN
INTRAMUSCULAR | Status: AC
  Filled 2015-03-09: qty 1

## 2015-03-09 MED ORDER — KETOROLAC TROMETHAMINE 15 MG/ML IJ SOLN
INTRAMUSCULAR | Status: DC | PRN
  Administered 2015-03-09: 15 mg via INTRAVENOUS

## 2015-03-09 MED ORDER — STERILE WATER FOR IRRIGATION IR SOLN
Status: DC | PRN
  Administered 2015-03-09: 6000 mL

## 2015-03-09 MED ORDER — CEFAZOLIN SODIUM 1-5 GM-% IV SOLN
1.0000 g | INTRAVENOUS | Status: DC
  Filled 2015-03-09: qty 50

## 2015-03-09 MED ORDER — ONDANSETRON HCL 4 MG/2ML IJ SOLN
INTRAMUSCULAR | Status: DC | PRN
  Administered 2015-03-09: 4 mg via INTRAVENOUS

## 2015-03-09 MED ORDER — PROPOFOL 10 MG/ML IV BOLUS
INTRAVENOUS | Status: AC
  Filled 2015-03-09: qty 40

## 2015-03-09 MED ORDER — GLYCOPYRROLATE 0.2 MG/ML IJ SOLN
INTRAMUSCULAR | Status: AC
  Filled 2015-03-09: qty 1

## 2015-03-09 MED ORDER — CEFAZOLIN SODIUM-DEXTROSE 2-3 GM-% IV SOLR
INTRAVENOUS | Status: AC
  Filled 2015-03-09: qty 50

## 2015-03-09 MED ORDER — PROMETHAZINE HCL 25 MG/ML IJ SOLN
6.2500 mg | INTRAMUSCULAR | Status: DC | PRN
  Filled 2015-03-09: qty 1

## 2015-03-09 MED ORDER — LACTATED RINGERS IV SOLN
INTRAVENOUS | Status: DC
  Administered 2015-03-09 (×2): via INTRAVENOUS
  Filled 2015-03-09: qty 1000

## 2015-03-09 MED ORDER — PROPOFOL 500 MG/50ML IV EMUL
INTRAVENOUS | Status: DC | PRN
  Administered 2015-03-09: 170 mL via INTRAVENOUS

## 2015-03-09 MED ORDER — EPHEDRINE SULFATE 50 MG/ML IJ SOLN
INTRAMUSCULAR | Status: DC | PRN
  Administered 2015-03-09: 15 mg via INTRAVENOUS
  Administered 2015-03-09: 10 mg via INTRAVENOUS

## 2015-03-09 MED ORDER — MIDAZOLAM HCL 5 MG/5ML IJ SOLN
INTRAMUSCULAR | Status: DC | PRN
  Administered 2015-03-09: 2 mg via INTRAVENOUS

## 2015-03-09 MED ORDER — PHENYLEPHRINE 40 MCG/ML (10ML) SYRINGE FOR IV PUSH (FOR BLOOD PRESSURE SUPPORT)
PREFILLED_SYRINGE | INTRAVENOUS | Status: AC
  Filled 2015-03-09: qty 10

## 2015-03-09 MED ORDER — MEPERIDINE HCL 25 MG/ML IJ SOLN
6.2500 mg | INTRAMUSCULAR | Status: DC | PRN
  Filled 2015-03-09: qty 1

## 2015-03-09 MED ORDER — FENTANYL CITRATE (PF) 100 MCG/2ML IJ SOLN
25.0000 ug | INTRAMUSCULAR | Status: DC | PRN
  Filled 2015-03-09: qty 1

## 2015-03-09 MED ORDER — MIDAZOLAM HCL 2 MG/2ML IJ SOLN
INTRAMUSCULAR | Status: AC
  Filled 2015-03-09: qty 2

## 2015-03-09 SURGICAL SUPPLY — 17 items
BAG DRAIN URO-CYSTO SKYTR STRL (DRAIN) ×3 IMPLANT
BAG DRN UROCATH (DRAIN) ×1
CANISTER SUCT LVC 12 LTR MEDI- (MISCELLANEOUS) IMPLANT
CLOTH BEACON ORANGE TIMEOUT ST (SAFETY) ×3 IMPLANT
ELECT REM PT RETURN 9FT ADLT (ELECTROSURGICAL) ×3
ELECTRODE REM PT RTRN 9FT ADLT (ELECTROSURGICAL) ×1 IMPLANT
GLOVE BIO SURGEON STRL SZ7.5 (GLOVE) ×5 IMPLANT
GOWN STRL REUS W/ TWL XL LVL3 (GOWN DISPOSABLE) ×1 IMPLANT
GOWN STRL REUS W/TWL XL LVL3 (GOWN DISPOSABLE) ×6
KIT ROOM TURNOVER WOR (KITS) ×3 IMPLANT
MANIFOLD NEPTUNE II (INSTRUMENTS) ×2 IMPLANT
NEEDLE HYPO 22GX1.5 SAFETY (NEEDLE) IMPLANT
NS IRRIG 500ML POUR BTL (IV SOLUTION) ×2 IMPLANT
PACK CYSTO (CUSTOM PROCEDURE TRAY) ×3 IMPLANT
TUBE CONNECTING 12'X1/4 (SUCTIONS) ×1
TUBE CONNECTING 12X1/4 (SUCTIONS) ×1 IMPLANT
WATER STERILE IRR 3000ML UROMA (IV SOLUTION) ×9 IMPLANT

## 2015-03-09 NOTE — Anesthesia Procedure Notes (Signed)
Procedure Name: LMA Insertion Date/Time: 03/09/2015 7:32 AM Performed by: Wanita Chamberlain Pre-anesthesia Checklist: Patient identified, Timeout performed, Emergency Drugs available, Suction available and Patient being monitored Patient Re-evaluated:Patient Re-evaluated prior to inductionOxygen Delivery Method: Circle system utilized Preoxygenation: Pre-oxygenation with 100% oxygen Intubation Type: IV induction Ventilation: Mask ventilation without difficulty LMA: LMA inserted LMA Size: 4.0 Number of attempts: 1 Placement Confirmation: positive ETCO2 and breath sounds checked- equal and bilateral Tube secured with: Tape Dental Injury: Teeth and Oropharynx as per pre-operative assessment

## 2015-03-09 NOTE — Discharge Instructions (Signed)
Cystoscopy, Care After °Refer to this sheet in the next few weeks. These instructions provide you with information on caring for yourself after your procedure. Your caregiver may also give you more specific instructions. Your treatment has been planned according to current medical practices, but problems sometimes occur. Call your caregiver if you have any problems or questions after your procedure. °HOME CARE INSTRUCTIONS  °Things you can do to ease any discomfort after your procedure include: °· Drinking enough water and fluids to keep your urine clear or pale yellow. °· Taking a warm bath to relieve any burning feelings. °SEEK IMMEDIATE MEDICAL CARE IF:  °· You have an increase in blood in your urine. °· You notice blood clots in your urine. °· You have difficulty passing urine. °· You have the chills. °· You have abdominal pain. °· You have a fever or persistent symptoms for more than 2-3 days. °· You have a fever and your symptoms suddenly get worse. °MAKE SURE YOU:  °· Understand these instructions. °· Will watch your condition. °· Will get help right away if you are not doing well or get worse. °  °This information is not intended to replace advice given to you by your health care provider. Make sure you discuss any questions you have with your health care provider. °  °Document Released: 10/07/2004 Document Revised: 04/10/2014 Document Reviewed: 09/11/2011 °Elsevier Interactive Patient Education ©2016 [employer] ° ° °Post Anesthesia Home Care Instructions ° °Activity: °Get plenty of rest for the remainder of the day. A responsible adult should stay with you for 24 hours following the procedure.  °For the next 24 hours, DO NOT: °-Drive a car °-Operate machinery °-Drink alcoholic beverages °-Take any medication unless instructed by your physician °-Make any legal decisions or sign important papers. ° °Meals: °Start with liquid foods such as gelatin or soup. Progress to regular foods as tolerated. Avoid  greasy, spicy, heavy foods. If nausea and/or vomiting occur, drink only clear liquids until the nausea and/or vomiting subsides. Call your physician if vomiting continues. ° °Special Instructions/Symptoms: °Your throat may feel dry or sore from the anesthesia or the breathing tube placed in your throat during surgery. If this causes discomfort, gargle with warm salt water. The discomfort should disappear within 24 hours. ° °If you had a scopolamine patch placed behind your ear for the management of post- operative nausea and/or vomiting: ° °1. The medication in the patch is effective for 72 hours, after which it should be removed.  Wrap patch in a tissue and discard in the trash. Wash hands thoroughly with soap and water. °2. You may remove the patch earlier than 72 hours if you experience unpleasant side effects which may include dry mouth, dizziness or visual disturbances. °3. Avoid touching the patch. Wash your hands with soap and water after contact with the patch. °  ° °

## 2015-03-09 NOTE — Anesthesia Postprocedure Evaluation (Signed)
Anesthesia Post Note  Patient: Justin Huber  Procedure(s) Performed: Procedure(s) (LRB): CYSTOSCOPY WITH BLADDER BIOPSY AND FULGRATION (N/A)  Patient location during evaluation: PACU Anesthesia Type: General Level of consciousness: awake and alert Pain management: pain level controlled Vital Signs Assessment: post-procedure vital signs reviewed and stable Respiratory status: spontaneous breathing, nonlabored ventilation and respiratory function stable Cardiovascular status: blood pressure returned to baseline and stable Postop Assessment: no signs of nausea or vomiting Anesthetic complications: no    Last Vitals:  Filed Vitals:   03/09/15 0830 03/09/15 0845  BP: 150/75 147/76  Pulse: 65 65  Temp:    Resp: 14 14    Last Pain: There were no vitals filed for this visit.               [name]

## 2015-03-09 NOTE — Transfer of Care (Signed)
Immediate Anesthesia Transfer of Care Note  Patient: Justin Huber  Procedure(s) Performed: Procedure(s): CYSTOSCOPY WITH BLADDER BIOPSY AND FULGRATION (N/A)  Patient Location: PACU  Anesthesia Type:General  Level of Consciousness: awake, alert , oriented and patient cooperative  Airway & Oxygen Therapy: Patient Spontanous Breathing and Patient connected to nasal cannula oxygen  Post-op Assessment: Report given to RN and Post -op Vital signs reviewed and stable  Post vital signs: Reviewed and stable  Last Vitals:  Filed Vitals:   03/09/15 0639 03/09/15 0818  BP: 120/72   Pulse: 64   Temp: 36.6 C 36.7 C  Resp: 16     Complications: No apparent anesthesia complications

## 2015-03-09 NOTE — Anesthesia Preprocedure Evaluation (Addendum)
Anesthesia Evaluation  Patient identified by MRN, date of birth, ID band Patient awake    Reviewed: Allergy & Precautions, NPO status , Patient's Chart, lab work & pertinent test results  Airway Mallampati: II  TM Distance: >3 FB Neck ROM: Full    Dental no notable dental hx. (+) Teeth Intact, Dental Advisory Given,    Pulmonary former smoker,    Pulmonary exam normal breath sounds clear to auscultation       Cardiovascular hypertension, Pt. on medications Normal cardiovascular exam Rhythm:Regular Rate:Normal     Neuro/Psych Lumbar stenosis negative neurological ROS  negative psych ROS   GI/Hepatic negative GI ROS, Neg liver ROS,   Endo/Other  negative endocrine ROS  Renal/GU Renal disease Bladder dysfunction  Bladder CA negative genitourinary   Musculoskeletal negative musculoskeletal ROS (+) L4-5 Spinal Stenosis   Abdominal   Peds negative pediatric ROS (+)  Hematology negative hematology ROS (+)   Anesthesia Other Findings Bladder CA  Reproductive/Obstetrics negative OB ROS                         Anesthesia Physical Anesthesia Plan  ASA: II  Anesthesia Plan: General   Post-op Pain Management:    Induction: Intravenous  Airway Management Planned: LMA  Additional Equipment:   Intra-op Plan:   Post-operative Plan: Extubation in OR  Informed Consent: I have reviewed the patients History and Physical, chart, labs and discussed the procedure including the risks, benefits and alternatives for the proposed anesthesia with the patient or authorized representative who has indicated his/her understanding and acceptance.   Dental advisory given  Plan Discussed with: CRNA  Anesthesia Plan Comments:         Anesthesia Quick Evaluation                                  Anesthesia Evaluation  Patient identified by MRN, date of birth, ID band Patient  awake    Reviewed: Allergy & Precautions, H&P , NPO status , Patient's Chart, lab work & pertinent test results  Airway Mallampati: II TM Distance: >3 FB Neck ROM: Full    Dental no notable dental hx. (+)    Pulmonary neg pulmonary ROS, former smoker,  breath sounds clear to auscultation  Pulmonary exam normal       Cardiovascular hypertension, Pt. on medications Rhythm:Regular Rate:Normal     Neuro/Psych negative neurological ROS  negative psych ROS   GI/Hepatic negative GI ROS, Neg liver ROS,   Endo/Other  negative endocrine ROS  Renal/GU Renal disease  negative genitourinary   Musculoskeletal negative musculoskeletal ROS (+)   Abdominal   Peds negative pediatric ROS (+)  Hematology negative hematology ROS (+) anemia ,   Anesthesia Other Findings   Reproductive/Obstetrics negative OB ROS                          Anesthesia Physical Anesthesia Plan  ASA: II  Anesthesia Plan: General   Post-op Pain Management:    Induction: Intravenous  Airway Management Planned: LMA  Additional Equipment:   Intra-op Plan:   Post-operative Plan: Extubation in OR  Informed Consent: I have reviewed the patients History and Physical, chart, labs and discussed the procedure including the risks, benefits and alternatives for the proposed anesthesia with the patient or authorized representative who has indicated his/her understanding and acceptance.   Dental advisory given  Plan Discussed with: CRNA  Anesthesia Plan Comments:         Anesthesia Quick Evaluation

## 2015-03-09 NOTE — Interval H&P Note (Signed)
History and Physical Interval Note:  03/09/2015 7:21 AM  Justin Huber  has presented today for surgery, with the diagnosis of bladder cancer  The various methods of treatment have been discussed with the patient and family. After consideration of risks, benefits and other options for treatment, the patient has consented to  Procedure(s): [place] (N/A) as a surgical intervention .  The patient's history has been reviewed, patient examined, no change in status, stable for surgery.  I have reviewed the patient's chart and labs.  Questions were answered to the patient's satisfaction.      [name]

## 2015-03-09 NOTE — Op Note (Signed)
Preoperative diagnosis: Bladder neoplasm Postoperative diagnosis: Bladder neoplasm  Procedure: Cystoscopy with bladder biopsy and fulguration, 3 cm  Surgeon: Junious Silk  Anesthesia: Gen.  Indication for procedure: 64 year old with history of incomplete bladder emptying, bladder diverticulum and bladder cancer. Small area papillary growth and erythema was noted on office cystoscopy and he was brought today for biopsies. CT scan was negative for mass or metastasis.  Findings: On exam under anesthesia the penis was circumcised and without mass or lesion. Testicles were descended bilaterally and palpably normal. On digital rectal exam the prostate was palpably normal. There were no hard areas or nodules.  On cystoscopy the urethra and the prostatic urethra were unremarkable although he does have a bit of a high median bar or bladder neck. The trigone and ureteral orifices were in their normal orthotopic position with clear efflux. There were no stones or foreign bodies in the bladder. The right diverticulum was visualized and noted to appear normal. Posteriorly there were 4 patches of erythema with pushing mucosal borders and areas of papillary changes interspersed. The mucosa here was very loose and thickened but pushed off the underlying bladder and appeared very superficial. There was a similar patch on the left bladder wall. These areas were biopsied and fulgurated.  Description of procedure: After consent was obtained patient brought to the operating room. After adequate anesthesia he was placed in lithotomy position and prepped and draped in the usual sterile fashion. A timeout was performed to confirm the patient and procedure. An exam under anesthesia was performed. The cystoscope was passed per urethra and the bladder and the diverticulum examined with a 30 and 70 lens. The 5 areas of abnormality were biopsied and fulgurated. Not only the biopsy sites but the entire patch of abnormality. The  bladder was copiously irrigated. Hemostasis was excellent at low-pressure. The scope removed. The patient was awakened and taken to recovery room in stable condition.  Complications: None  Blood loss: Minimal  Specimens to pathology: 1 posterior bladder biopsy 2 posterior inferior 3 left wall 4 posterior superior  Drains: None

## 2015-03-10 ENCOUNTER — Encounter (HOSPITAL_BASED_OUTPATIENT_CLINIC_OR_DEPARTMENT_OTHER): Payer: Self-pay | Admitting: Urology

## 2015-03-16 ENCOUNTER — Encounter (HOSPITAL_BASED_OUTPATIENT_CLINIC_OR_DEPARTMENT_OTHER): Payer: Self-pay | Admitting: Urology

## 2015-08-25 ENCOUNTER — Other Ambulatory Visit: Payer: Self-pay | Admitting: Urology

## 2015-09-16 ENCOUNTER — Encounter (HOSPITAL_BASED_OUTPATIENT_CLINIC_OR_DEPARTMENT_OTHER): Payer: Self-pay | Admitting: *Deleted

## 2015-09-16 NOTE — Progress Notes (Signed)
SPOKE W/ PT'S WIFE, ANN.  NPO AFTER MN.  ARRIVE AT 0600.  NEEDS ISTAT.  CURRENT EKG IN CHART AND EPIC.  WILL  TAKE AVAPRO AM DOS W/ SIPS OF WATER.

## 2015-09-20 NOTE — Anesthesia Preprocedure Evaluation (Addendum)
Anesthesia Evaluation  Patient identified by MRN, date of birth, ID band Patient awake    Reviewed: Allergy & Precautions, H&P , NPO status , Patient's Chart, lab work & pertinent test results  Airway Mallampati: II  TM Distance: >3 FB Neck ROM: Full    Dental no notable dental hx. (+) Teeth Intact, Dental Advisory Given   Pulmonary neg pulmonary ROS, former smoker,    Pulmonary exam normal breath sounds clear to auscultation       Cardiovascular hypertension, Pt. on medications Normal cardiovascular exam Rhythm:Regular Rate:Normal     Neuro/Psych negative neurological ROS  negative psych ROS   GI/Hepatic negative GI ROS, Neg liver ROS,   Endo/Other  negative endocrine ROS  Renal/GU Renal disease     Musculoskeletal negative musculoskeletal ROS (+)   Abdominal   Peds  Hematology negative hematology ROS (+)   Anesthesia Other Findings   Reproductive/Obstetrics negative OB ROS                            Anesthesia Physical  Anesthesia Plan  ASA: II  Anesthesia Plan: General   Post-op Pain Management:    Induction: Intravenous  Airway Management Planned: LMA and Oral ETT  Additional Equipment:   Intra-op Plan:   Post-operative Plan: Extubation in OR  Informed Consent: I have reviewed the patients History and Physical, chart, labs and discussed the procedure including the risks, benefits and alternatives for the proposed anesthesia with the patient or authorized representative who has indicated his/her understanding and acceptance.   Dental advisory given  Plan Discussed with: CRNA  Anesthesia Plan Comments:        Anesthesia Quick Evaluation

## 2015-09-20 NOTE — H&P (Signed)
History of Present Illness F/u - PCP Dr. 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 Pt with h/o of possible Ta bladder ca (?dx in [place]) and f/u bladder diverticulectomy and pelvic lymphadenectomy at [facility] which was benign.  Last upper tract: Nov 2016 CT hematuria benign Last BCG: May 2017 completed BCG induction - delayed due to incomplete bladder emptying/UTI Last cystoscopy: Dec 2016 - cysto bbx   2- Nephrolithiasis - pt with bilateral small stones. Prior ESWL. Stones are calcium oxalate.  -Aug 2015 - stented for fever and proximal right ureteral stone.  -Sep - Oct 2015 taking for staged right ureteroscopy. Stent removed in office.   3- infected lymphocele - Aug 2015 - presented with fever, irritative voiding symptoms. CT A/P - fluid collection in the left pelvis, adjacent to bladder (confirmed with cystogram). A perc drain was placed and grew bacteroides fragilis + beta lactamase. Cytology confirmed abundant acute inflammation, no malignancy. Tx with clinda, Augmentin.   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 No luts once lymphocele drained and abx started. S/p bladder diverticulectomy at [facility]. His final PVR in hospital was 0. Mild BPH on exam and cystoscopy. He was started on tamsulosin. -Nov 2015 - Ultrasound -- 2 possible stones in the right kidney one in the left. No hydronephrosis. Bladder with 424 mL. Bladder diverticulum not distended.  -Nov 2016 pvr 352 ml with 38 ml in tic   5- Pca screening -  -Dec 2013 PSA 1.36  -Aug 2015 normal DRE/EUA  -Nov 2016 PSA 1.25 -Dec 2016 nl DRE/EUA  6- ED - He has some trouble getting and maintaining erection. Libido is good. He started Viagra June 2016. He tried 100 mg with good results and 50 mg with a variable result. He has no chest pain and does not use nitroglycerin.   May 2017 interval hx Patient returns for continued  management of bladder diverticulum and bladder CIS. As expected there was some delay in the BCG due to incomplete bladder emptying. Also urinary tract infection. He had cloudy urine earlier this week. UA with some white cells and red cells but no bacteria. Culture was negative. Urine is clear today.  Past Medical History Problems  1. History of Cancer 2. History of hypercholesterolemia (Z86.39) 3. History of hypertension (Z86.79) 4. History of kidney stones [id number]) Surgical History Problems  1. History of Cystoscopy Bladder Tumor 2. History of Cystoscopy With Fragmentation Of Bladder Calculus 3. History of Cystoscopy With Fulguration Medium Lesion (2-5cm) 4. History of Cystoscopy With Insertion Of Ureteral Stent Right 5. History of Cystoscopy With Insertion Of Ureteral Stent Right 6. History of Cystoscopy With Insertion Of Ureteral Stent Right 7. History of Cystoscopy With Ureteroscopy 8. History of Cystoscopy With Ureteroscopy Right 9. History of Cystoscopy With Ureteroscopy With Lithotripsy 10. History of Diagnostic Cystoscopy 11. History of Neck Surgery 12. History of Renal Lithotripsy 13. History of Repair Of Forearm 14. History of Rhinoplasty Current Meds 1. Aspirin 81 MG TABS; Therapy: (Recorded:31Oct2011) to Recorded 2. Avapro 300 MG Oral Tablet; Therapy: (Recorded:18Sep2015) to Recorded 3. HydroCHLOROthiazide CAPS; Therapy: (Recorded:31Oct2011) to Recorded 4. Mobic 7.5 MG Oral Tablet; Therapy: (Recorded:14Jun2016) to Recorded 5. Sildenafil Citrate 20 MG Oral Tablet; take 2-5 tabs as needed; Therapy: [id number] to (Last Rx:16Nov2016) Ordered 6. Vitamin B-6 TABS; Therapy: (Recorded:16Nov2016) to Recorded 7. Vitamin D TABS; Therapy: (Recorded:16Nov2016) to Recorded Allergies Medication  1. No  Known Drug Allergies Family History Problems  1. Family history of Family Health Status - Mother's Age  11 2. Family history of Family Health Status Number Of Children  2  SONS 3. Family history of Nephrolithiasis 4. No pertinent family history : Mother Social History Problems  1. Alcohol Use (History)  2-3 per wk 2. Caffeine Use  3 PER DAY 3. Family history of Death In The Family Father  AGE 39 - [place] 4. Former smoker [phone]) 5. Marital History - Currently Married 6. 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 Tobacco Use  LESS THAN 1 PACK PER DAYSMOKED FOR 15 YEARSQUIT 15 YEARS AGO Vitals Vital Signs [Data Includes: Last 1 Day]  Recorded: [id number] 09:40AM  Blood Pressure: 155 / 87 Temperature: 97 F Heart Rate: 61 Physical Exam Constitutional: Well nourished and well developed . No acute distress.  Pulmonary: No respiratory distress and normal respiratory rhythm and effort.  Cardiovascular: Heart rate and rhythm are normal . No peripheral edema.  Neuro/Psych:. Mood and affect are appropriate.  Results/Data Urine [Data Includes: Last 1 Day]   [id number]  COLOR YELLOW   APPEARANCE CLOUDY   SPECIFIC GRAVITY 1.010   pH 5.5   GLUCOSE NEGATIVE   BILIRUBIN NEGATIVE   KETONE NEGATIVE   BLOOD 2+   PROTEIN NEGATIVE   NITRITE NEGATIVE   LEUKOCYTE ESTERASE 3+   SQUAMOUS EPITHELIAL/HPF 0-5 HPF  WBC 40-60 WBC/HPF  RBC 10-20 RBC/HPF  BACTERIA FEW HPF  CRYSTALS NONE SEEN HPF  CASTS NONE SEEN LPF  Other   Yeast NONE SEEN HPF  Procedure Procedure: Cystoscopy  Indication: History of Urothelial Carcinoma.  Informed Consent: Risks, benefits, and potential adverse events were discussed and informed consent was obtained from the patient.  Prep: The patient was prepped with betadine.  Antibiotic prophylaxis: Ciprofloxacin.  Procedure Note:  Urethral meatus:. No abnormalities.  Anterior urethra: No abnormalities.  Prostatic urethra: No abnormalities.  Bladder: Visulization was obscured due to cloudy urine. I drained about 300 cc of urine out of the bladder after passing the scope. I then refilled him with clear irrigation which helped  but visualization was still less than optimal. The ureteral orifices were in the normal anatomic position bilaterally and had clear efflux of urine. A systematic survey of the bladder demonstrated no bladder tumors or stones. The mucosa was smooth without abnormalities. Examination of the bladder demonstrated erythematous mucosa. A solitary tumor was visualized in the bladder. This was over on the right side at the entrance to the diverticulum. It looked more nodular or like an early papillary tumor. The patient tolerated the procedure well. Urine was clear today.  Complications: None.  Assessment Assessed  1. 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 Follow-up Schedule Surgery Office Follow-up Status: Hold For - Appointment Requested for: Complete Done:1 [id number] 2. Cysto; Status:Complete; [id number] 3. URINE CYTOLOGY; Status:Hold For - Specimen/Data Collection,Appointment; Requested for:24May2017; 1  Health Maintenance  4. UA With REFLEX; [Do Not Release]; Status:Complete; [id number] 09:27AM Other retention of urine  5. Start: Tamsulosin HCl - 0.4 MG Oral Capsule; TAKE 1 CAPSULE Daily1  1 Amended By: Festus Aloe; Aug 27 2015 3:08 PM EST  Discussion/Summary Bladder CIS-much improved today. No consolidative areas of erythema today. More diffuse. However, possible papillary tumor at entrance to right diverticulum neds TURBT/bx. We could also rebiopsy any suspicious areas of erythema in the  operating room. We'll consider epirubicin postop. Discussed nature risks benefits and alternatives with the patient and he elected to proceed.  urinary retention, bladder tic - patient asked if tamsulosin would help him empty his bladder. We discussed it would relax the prostate and may lessen resistance at the lower. His bladder more but it will not affect  bladder contractility or emptying of the diverticulum per se. Therefore considering the risks of alpha blockers which we discussed he would like to try it. I sent a prescription for tamsulosin.   Signatures Electronically signed by : Festus Aloe, M.D.; Aug 27 2015 3:10PM EST  Add: cytology was negative; recent UA with bacteria - Bactrim DS started. Cefazolin OK for pre-op abx based on last cx.

## 2015-09-21 ENCOUNTER — Ambulatory Visit (HOSPITAL_BASED_OUTPATIENT_CLINIC_OR_DEPARTMENT_OTHER): Payer: Medicare HMO | Admitting: Certified Registered"

## 2015-09-21 ENCOUNTER — Encounter (HOSPITAL_BASED_OUTPATIENT_CLINIC_OR_DEPARTMENT_OTHER): Payer: Self-pay | Admitting: *Deleted

## 2015-09-21 ENCOUNTER — Encounter (HOSPITAL_BASED_OUTPATIENT_CLINIC_OR_DEPARTMENT_OTHER)
Admission: RE | Disposition: A | Discharge status: Home / Self Care | Payer: Self-pay | Source: Ambulatory Visit | Attending: Urology

## 2015-09-21 ENCOUNTER — Ambulatory Visit (HOSPITAL_BASED_OUTPATIENT_CLINIC_OR_DEPARTMENT_OTHER)
Admission: RE | Admit: 2015-09-21 | Discharge: 2015-09-21 | Disposition: A | Discharge status: Home / Self Care | Payer: Medicare HMO | Source: Ambulatory Visit | Attending: Urology | Admitting: Urology

## 2015-09-21 DIAGNOSIS — Z87891 Personal history of nicotine dependence: Secondary | ICD-10-CM | POA: Insufficient documentation

## 2015-09-21 DIAGNOSIS — N3090 Cystitis, unspecified without hematuria: Secondary | ICD-10-CM | POA: Insufficient documentation

## 2015-09-21 DIAGNOSIS — I10 Essential (primary) hypertension: Secondary | ICD-10-CM | POA: Insufficient documentation

## 2015-09-21 DIAGNOSIS — Z79899 Other long term (current) drug therapy: Secondary | ICD-10-CM | POA: Insufficient documentation

## 2015-09-21 DIAGNOSIS — D090 Carcinoma in situ of bladder: Secondary | ICD-10-CM | POA: Diagnosis present

## 2015-09-21 DIAGNOSIS — N323 Diverticulum of bladder: Secondary | ICD-10-CM | POA: Insufficient documentation

## 2015-09-21 DIAGNOSIS — Z7982 Long term (current) use of aspirin: Secondary | ICD-10-CM | POA: Diagnosis not present

## 2015-09-21 DIAGNOSIS — Z841 Family history of disorders of kidney and ureter: Secondary | ICD-10-CM | POA: Insufficient documentation

## 2015-09-21 DIAGNOSIS — R339 Retention of urine, unspecified: Secondary | ICD-10-CM | POA: Insufficient documentation

## 2015-09-21 DIAGNOSIS — Z791 Long term (current) use of non-steroidal anti-inflammatories (NSAID): Secondary | ICD-10-CM | POA: Insufficient documentation

## 2015-09-21 DIAGNOSIS — Z87442 Personal history of urinary calculi: Secondary | ICD-10-CM | POA: Diagnosis not present

## 2015-09-21 DIAGNOSIS — Z86008 Personal history of in-situ neoplasm of other site: Secondary | ICD-10-CM | POA: Insufficient documentation

## 2015-09-21 DIAGNOSIS — N3289 Other specified disorders of bladder: Secondary | ICD-10-CM | POA: Diagnosis not present

## 2015-09-21 HISTORY — PX: TRANSURETHRAL RESECTION OF BLADDER TUMOR: SHX2575

## 2015-09-21 LAB — POCT I-STAT, CHEM 8
BUN: 19 mg/dL (ref 6–20)
CHLORIDE: 105 mmol/L (ref 101–111)
Calcium, Ion: 1.54 mmol/L — ABNORMAL HIGH (ref 1.13–1.30)
Creatinine, Ser: 0.6 mg/dL — ABNORMAL LOW (ref 0.61–1.24)
Glucose, Bld: 110 mg/dL — ABNORMAL HIGH (ref 65–99)
HEMATOCRIT: 41 % (ref 39.0–52.0)
Hemoglobin: 13.9 g/dL (ref 13.0–17.0)
Potassium: 3.4 mmol/L — ABNORMAL LOW (ref 3.5–5.1)
SODIUM: 143 mmol/L (ref 135–145)
TCO2: 27 mmol/L (ref 0–100)

## 2015-09-21 SURGERY — TURBT (TRANSURETHRAL RESECTION OF BLADDER TUMOR)
Anesthesia: General | Site: Bladder

## 2015-09-21 MED ORDER — PROPOFOL 500 MG/50ML IV EMUL
INTRAVENOUS | Status: DC | PRN
  Administered 2015-09-21: 200 mL via INTRAVENOUS

## 2015-09-21 MED ORDER — MEPERIDINE HCL 25 MG/ML IJ SOLN
6.2500 mg | INTRAMUSCULAR | Status: DC | PRN
  Filled 2015-09-21: qty 1

## 2015-09-21 MED ORDER — LIDOCAINE HCL (CARDIAC) 20 MG/ML IV SOLN
INTRAVENOUS | Status: DC | PRN
  Administered 2015-09-21: 80 mg via INTRAVENOUS

## 2015-09-21 MED ORDER — STERILE WATER FOR IRRIGATION IR SOLN
Status: DC | PRN
  Administered 2015-09-21: 3000 mL
  Administered 2015-09-21: 500 mL
  Administered 2015-09-21: 3000 mL

## 2015-09-21 MED ORDER — CEFTRIAXONE SODIUM 1 G IJ SOLR
1.0000 g | INTRAMUSCULAR | Status: DC
  Filled 2015-09-21: qty 10

## 2015-09-21 MED ORDER — HYDROMORPHONE HCL 1 MG/ML IJ SOLN
0.2500 mg | INTRAMUSCULAR | Status: DC | PRN
  Filled 2015-09-21: qty 1

## 2015-09-21 MED ORDER — MIDAZOLAM HCL 5 MG/5ML IJ SOLN
INTRAMUSCULAR | Status: DC | PRN
  Administered 2015-09-21: 2 mg via INTRAVENOUS

## 2015-09-21 MED ORDER — ONDANSETRON HCL 4 MG/2ML IJ SOLN
INTRAMUSCULAR | Status: AC
  Filled 2015-09-21: qty 2

## 2015-09-21 MED ORDER — SODIUM CHLORIDE 0.9 % IR SOLN
Status: DC | PRN
  Administered 2015-09-21: 3000 mL

## 2015-09-21 MED ORDER — SODIUM CHLORIDE 0.9 % IV SOLN: 50.0000 mg | Freq: Once | INTRAVENOUS | Status: DC

## 2015-09-21 MED ORDER — FENTANYL CITRATE (PF) 100 MCG/2ML IJ SOLN
INTRAMUSCULAR | Status: AC
  Filled 2015-09-21: qty 2

## 2015-09-21 MED ORDER — DEXAMETHASONE SODIUM PHOSPHATE 10 MG/ML IJ SOLN
INTRAMUSCULAR | Status: AC
  Filled 2015-09-21: qty 1

## 2015-09-21 MED ORDER — PROMETHAZINE HCL 25 MG/ML IJ SOLN
6.2500 mg | INTRAMUSCULAR | Status: DC | PRN
  Filled 2015-09-21: qty 1

## 2015-09-21 MED ORDER — CEFTRIAXONE SODIUM 2 G IJ SOLR
INTRAMUSCULAR | Status: AC
  Filled 2015-09-21: qty 2

## 2015-09-21 MED ORDER — DEXAMETHASONE SODIUM PHOSPHATE 10 MG/ML IJ SOLN
INTRAMUSCULAR | Status: DC | PRN
  Administered 2015-09-21: 10 mg via INTRAVENOUS

## 2015-09-21 MED ORDER — DEXTROSE 5 % IV SOLN
INTRAVENOUS | Status: AC
  Filled 2015-09-21: qty 50

## 2015-09-21 MED ORDER — ARTIFICIAL TEARS OP OINT
TOPICAL_OINTMENT | OPHTHALMIC | Status: AC
  Filled 2015-09-21: qty 3.5

## 2015-09-21 MED ORDER — LACTATED RINGERS IV SOLN
INTRAVENOUS | Status: DC
  Administered 2015-09-21: 07:00:00 via INTRAVENOUS
  Filled 2015-09-21: qty 1000

## 2015-09-21 MED ORDER — ONDANSETRON HCL 4 MG/2ML IJ SOLN
INTRAMUSCULAR | Status: DC | PRN
  Administered 2015-09-21: 4 mg via INTRAVENOUS

## 2015-09-21 MED ORDER — MIDAZOLAM HCL 2 MG/2ML IJ SOLN
INTRAMUSCULAR | Status: AC
  Filled 2015-09-21: qty 2

## 2015-09-21 MED ORDER — PROPOFOL 10 MG/ML IV BOLUS
INTRAVENOUS | Status: AC
  Filled 2015-09-21: qty 40

## 2015-09-21 MED ORDER — FENTANYL CITRATE (PF) 100 MCG/2ML IJ SOLN
INTRAMUSCULAR | Status: DC | PRN
  Administered 2015-09-21 (×2): 50 ug via INTRAVENOUS

## 2015-09-21 MED ORDER — DEXTROSE 5 % IV SOLN
2.0000 g | INTRAVENOUS | Status: AC
  Administered 2015-09-21: 2 g via INTRAVENOUS
  Filled 2015-09-21: qty 2

## 2015-09-21 MED ORDER — SULFAMETHOXAZOLE-TRIMETHOPRIM 800-160 MG PO TABS: 1.0000 | ORAL_TABLET | Freq: Every day | ORAL | Status: DC

## 2015-09-21 MED ORDER — LIDOCAINE HCL (CARDIAC) 20 MG/ML IV SOLN
INTRAVENOUS | Status: AC
  Filled 2015-09-21: qty 5

## 2015-09-21 MED ORDER — FENTANYL CITRATE (PF) 100 MCG/2ML IJ SOLN
INTRAMUSCULAR | Status: AC
  Filled 2015-09-21: qty 4

## 2015-09-21 SURGICAL SUPPLY — 30 items
BAG DRAIN URO-CYSTO SKYTR STRL (DRAIN) ×3 IMPLANT
BAG DRN ANRFLXCHMBR STRAP LEK (BAG) ×1
BAG DRN UROCATH (DRAIN) ×1
BAG URINE DRAINAGE (UROLOGICAL SUPPLIES) IMPLANT
BAG URINE LEG 19OZ MD ST LTX (BAG) ×2 IMPLANT
CATH FOLEY 2WAY SLVR  5CC 16FR (CATHETERS) ×2
CATH FOLEY 2WAY SLVR  5CC 20FR (CATHETERS)
CATH FOLEY 2WAY SLVR  5CC 22FR (CATHETERS)
CATH FOLEY 2WAY SLVR 5CC 16FR (CATHETERS) IMPLANT
CATH FOLEY 2WAY SLVR 5CC 20FR (CATHETERS) IMPLANT
CATH FOLEY 2WAY SLVR 5CC 22FR (CATHETERS) IMPLANT
CLOTH BEACON ORANGE TIMEOUT ST (SAFETY) ×3 IMPLANT
ELECT BUTTON BIOP 24F 90D PLAS (MISCELLANEOUS) IMPLANT
ELECT REM PT RETURN 9FT ADLT (ELECTROSURGICAL) ×3
ELECTRODE REM PT RTRN 9FT ADLT (ELECTROSURGICAL) ×1 IMPLANT
EVACUATOR MICROVAS BLADDER (UROLOGICAL SUPPLIES) IMPLANT
GLOVE BIO SURGEON STRL SZ 6.5 (GLOVE) ×1 IMPLANT
GLOVE BIO SURGEON STRL SZ7.5 (GLOVE) ×5 IMPLANT
GLOVE BIO SURGEONS STRL SZ 6.5 (GLOVE) ×1
GLOVE INDICATOR 6.5 STRL GRN (GLOVE) ×4 IMPLANT
GOWN STRL REUS W/ TWL LRG LVL3 (GOWN DISPOSABLE) IMPLANT
GOWN STRL REUS W/ TWL XL LVL3 (GOWN DISPOSABLE) ×1 IMPLANT
GOWN STRL REUS W/TWL LRG LVL3 (GOWN DISPOSABLE) ×3
GOWN STRL REUS W/TWL XL LVL3 (GOWN DISPOSABLE) ×3
HOLDER FOLEY CATH W/STRAP (MISCELLANEOUS) IMPLANT
KIT ROOM TURNOVER WOR (KITS) ×3 IMPLANT
MANIFOLD NEPTUNE II (INSTRUMENTS) ×2 IMPLANT
PACK CYSTO (CUSTOM PROCEDURE TRAY) ×3 IMPLANT
TUBE CONNECTING 12'X1/4 (SUCTIONS) ×1
TUBE CONNECTING 12X1/4 (SUCTIONS) ×1 IMPLANT

## 2015-09-21 NOTE — Discharge Instructions (Signed)
Foley Catheter Care, Adult A Foley catheter is a soft, flexible tube that is placed into the bladder to drain urine. A Foley catheter may be inserted if:  You leak urine or are not able to control when you urinate (urinary incontinence).  You are not able to urinate when you need to (urinary retention).  You had prostate surgery or surgery on the genitals.  You have certain medical conditions, such as multiple sclerosis, dementia, or a spinal cord injury. If you are going home with a Foley catheter in place, follow the instructions below.  REMOVE FOLEY Friday morning, 09/24/2015.   TAKING CARE OF THE CATHETER 1. Wash your hands with soap and water. 2. Using mild soap and warm water on a clean washcloth:  Clean the area on your body closest to the catheter insertion site using a circular motion, moving away from the catheter. Never wipe toward the catheter because this could sweep bacteria up into the urethra and cause infection.  Remove all traces of soap. Pat the area dry with a clean towel. For males, reposition the foreskin. 3. Attach the catheter to your leg so there is no tension on the catheter. Use adhesive tape or a leg strap. If you are using adhesive tape, remove any sticky residue left behind by the previous tape you used. 4. Keep the drainage bag below the level of the bladder, but keep it off the floor. 5. Check throughout the day to be sure the catheter is working and urine is draining freely. Make sure the tubing does not become kinked. 6. Do not pull on the catheter or try to remove it. Pulling could damage internal tissues. TAKING CARE OF THE DRAINAGE BAGS You will be given two drainage bags to take home. One is a large overnight drainage bag, and the other is a smaller leg bag that fits underneath clothing. You may wear the overnight bag at any time, but you should never wear the smaller leg bag at night. Follow the instructions below for how to empty, change, and clean  your drainage bags. Emptying the Drainage Bag You must empty your drainage bag when it is  - full or at least 2-3 times a day. 1. Wash your hands with soap and water. 2. Keep the drainage bag below your hips, below the level of your bladder. This stops urine from going back into the tubing and into your bladder. 3. Hold the dirty bag over the toilet or a clean container. 4. Open the pour spout at the bottom of the bag and empty the urine into the toilet or container. Do not let the pour spout touch the toilet, container, or any other surface. Doing so can place bacteria on the bag, which can cause an infection. 5. Clean the pour spout with a gauze pad or cotton ball that has rubbing alcohol on it. 6. Close the pour spout. 7. Attach the bag to your leg with adhesive tape or a leg strap. 8. Wash your hands well. Changing the Drainage Bag Change your drainage bag once a month or sooner if it starts to smell bad or look dirty. Below are steps to follow when changing the drainage bag. 1. Wash your hands with soap and water. 2. Pinch off the rubber catheter so that urine does not spill out. 3. Disconnect the catheter tube from the drainage tube at the connection valve. Do not let the tubes touch any surface. 4. Clean the end of the catheter tube with an alcohol  wipe. Use a different alcohol wipe to clean the end of the drainage tube. 5. Connect the catheter tube to the drainage tube of the clean drainage bag. 6. Attach the new bag to the leg with adhesive tape or a leg strap. Avoid attaching the new bag too tightly. 7. Wash your hands well. Cleaning the Drainage Bag 1. Wash your hands with soap and water. 2. Wash the bag in warm, soapy water. 3. Rinse the bag thoroughly with warm water. 4. Fill the bag with a solution of white vinegar and water (1 cup vinegar to 1 qt warm water [.2 L vinegar to 1 L warm water]). Close the bag and soak it for 30 minutes in the solution. 5. Rinse the bag with warm  water. 6. Hang the bag to dry with the pour spout open and hanging downward. 7. Store the clean bag (once it is dry) in a clean plastic bag. 8. Wash your hands well. PREVENTING INFECTION  Wash your hands before and after handling your catheter.  Take showers daily and wash the area where the catheter enters your body. Do not take baths. Replace wet leg straps with dry ones, if this applies.  Do not use powders, sprays, or lotions on the genital area. Only use creams, lotions, or ointments as directed by your caregiver.  For females, wipe from front to back after each bowel movement.  Drink enough fluids to keep your urine clear or pale yellow unless you have a fluid restriction.  Do not let the drainage bag or tubing touch or lie on the floor.  Wear cotton underwear to absorb moisture and to keep your skin drier. SEEK MEDICAL CARE IF:   Your urine is cloudy or smells unusually bad.  Your catheter becomes clogged.  You are not draining urine into the bag or your bladder feels full.  Your catheter starts to leak. SEEK IMMEDIATE MEDICAL CARE IF:   You have pain, swelling, redness, or pus where the catheter enters the body.  You have pain in the abdomen, legs, lower back, or bladder.  You have a fever.  You see blood fill the catheter, or your urine is pink or red.  You have nausea, vomiting, or chills.  Your catheter gets pulled out. MAKE SURE YOU:   Understand these instructions.  Will watch your condition.  Will get help right away if you are not doing well or get worse.   This information is not intended to replace advice given to you by your health care provider. Make sure you discuss any questions you have with your health care provider.   Document Released: 03/20/2005 Document Revised: 08/04/2013 Document Reviewed: 03/11/2012 Elsevier Interactive Patient Education 2016 [place]. Cystoscopy, Care After Refer to this sheet in the next few weeks. These  instructions provide you with information on caring for yourself after your procedure. Your caregiver may also give you more specific instructions. Your treatment has been planned according to current medical practices, but problems sometimes occur. Call your caregiver if you have any problems or questions after your procedure. HOME CARE INSTRUCTIONS  Things you can do to ease any discomfort after your procedure include:  Drinking enough water and fluids to keep your urine clear or pale yellow.  Taking a warm bath to relieve any burning feelings. SEEK IMMEDIATE MEDICAL CARE IF:  7. You have an increase in blood in your urine. 8. You notice blood clots in your urine. 9. You have difficulty passing urine. 10. You have  the chills. 11. You have abdominal pain. 12. You have a fever or persistent symptoms for more than 2-3 days. 13. You have a fever and your symptoms suddenly get worse. MAKE SURE YOU:  9. Understand these instructions. 10. Will watch your condition. 11. Will get help right away if you are not doing well or get worse.   This information is not intended to replace advice given to you by your health care provider. Make sure you discuss any questions you have with your health care provider.   Document Released: 10/07/2004 Document Revised: 04/10/2014 Document Reviewed: 09/11/2011 Elsevier Interactive Patient Education 2016 [place] Anest[facility] Home Care Instructions  Activity: Get plenty of rest for the remainder of the day. A responsible adult should stay with you for 24 hours following the procedure.  For the next 24 hours, DO NOT: -Drive a car -Paediatric nurse -Drink alcoholic beverages -Take any medication unless instructed by your physician -Make any legal decisions or sign important papers.  Meals: Start with liquid foods such as gelatin or soup. Progress to regular foods as tolerated. Avoid greasy, spicy, heavy foods. If nausea and/or vomiting occur,  drink only clear liquids until the nausea and/or vomiting subsides. Call your physician if vomiting continues.  Special Instructions/Symptoms: Your throat may feel dry or sore from the anesthesia or the breathing tube placed in your throat during surgery. If this causes discomfort, gargle with warm salt water. The discomfort should disappear within 24 hours.  If you had a scopolamine patch placed behind your ear for the management of post- operative nausea and/or vomiting:  1. The medication in the patch is effective for 72 hours, after which it should be removed.  Wrap patch in a tissue and discard in the trash. Wash hands thoroughly with soap and water. 2. You may remove the patch earlier than 72 hours if you experience unpleasant side effects which may include dry mouth, dizziness or visual disturbances. 3. Avoid touching the patch. Wash your hands with soap and water after contact with the patch.

## 2015-09-21 NOTE — Op Note (Signed)
Preoperative diagnosis: Bladder neoplasm Postoperative diagnosis: Bladder neoplasm  Procedure: TURBT less than 2 cm  Surgeon: Junious Silk  Anesthesia: Gen.  Indication for procedure: 65 year old white male with a history of incomplete bladder emptying, right bladder diverticulum, posterior and left lateral CIS. He completed a round of BCG and appeared to have some neoplastic changes in the right diverticulum and was brought for biopsies.  Findings: On exam under anesthesia the penis was circumcised without mass or lesion. The testicles were sent bilaterally and palpably normal. On digital rectal exam the prostate was smooth and without hard area or nodule.  On cystoscopy the urethra appeared normal, the prostatic urethra appeared normal and nonobstructing. The bladder was significantly inflamed from recent chronic infection as well as recent BCG. The trigone and ureteral orifices appeared normal. There was clear efflux bilaterally. There was a large right bladder diverticulum. Notable findings included 1) right superior bladder thickening and what look like early papillary formations, 2) a more conspicuous erythematous area right anterior, 3) the prior posterior and left CIS biopsy sites appeared clear of tumor and overall inflamed consistent with the rest of the bladder (much improved and contained nothing suspicious) and 4) in the superior portion of the right diverticulum there was a thickened area that may been some papillary changes. These 4 sites were biopsied and fulgurated the largest being about 2 cm the right superior and right diverticulum superior.  Description of procedure: After consent was obtained patient brought to the operating room. After adequate anesthesia he is placed in lithotomy position. An exam under anesthesia was performed. He was prepped and draped in the usual sterile fashion. A timeout was performed to confirm the patient and procedure. Cystoscope was passed per urethra  and the bladder and the right diverticulum copiously irrigated many times as I inspected with the 30 and and the 70 lens. I then used the rigid biopsy forceps to perform the right superior biopsy and the flexible biopsy forceps to biopsy the remainder of the sites. The biopsy sites were carefully fulgurated as well as any surrounding abnormalities being careful not to extend the cautery excessively. Hemostasis was excellent. The bladder was drained and then refilled and a 16 [place] Foley was placed. This was left to gravity drainage with clear urine. The patient was awakened taken to recovery room in stable condition.  Complications: None  Blood loss: Minimal  Drains: 16 French Foley  Specimens to pathology: #1 right superior bladder biopsy #2 right anterior bladder biopsy #3 left bladder biopsy #4 right diverticulum-superior

## 2015-09-21 NOTE — Transfer of Care (Signed)
Immediate Anesthesia Transfer of Care Note  Patient: Randal B Kosch  Procedure(s) Performed: Procedure(s): TRANSURETHRAL RESECTION OF BLADDER TUMOR (TURBT) less than 2 cm  (N/A)  Patient Location: PACU  Anesthesia Type:General  Level of Consciousness: awake, alert , oriented and patient cooperative  Airway & Oxygen Therapy: Patient Spontanous Breathing and Patient connected to nasal cannula oxygen  Post-op Assessment: Report given to RN and Post -op Vital signs reviewed and stable  Post vital signs: Reviewed and stable  Last Vitals:  Filed Vitals:   09/21/15 0614  BP: 120/72  Pulse: 65  Temp: 36.9 C  Resp: 16    Last Pain: There were no vitals filed for this visit.    Patients Stated Pain Goal: 8 [phone]  Complications: No apparent anesthesia complications

## 2015-09-21 NOTE — Anesthesia Procedure Notes (Signed)
Procedure Name: LMA Insertion Date/Time: 09/21/2015 7:37 AM Performed by: Wanita Chamberlain Pre-anesthesia Checklist: Patient identified, Timeout performed, Emergency Drugs available, Suction available and Patient being monitored Patient Re-evaluated:Patient Re-evaluated prior to inductionOxygen Delivery Method: Circle system utilized Preoxygenation: Pre-oxygenation with 100% oxygen Intubation Type: IV induction Ventilation: Mask ventilation without difficulty LMA: LMA inserted LMA Size: 4.0 Number of attempts: 1 Placement Confirmation: positive ETCO2 and breath sounds checked- equal and bilateral Tube secured with: Tape Dental Injury: Teeth and Oropharynx as per pre-operative assessment

## 2015-09-21 NOTE — Interval H&P Note (Signed)
History and Physical Interval Note:  09/21/2015 7:23 AM  Justin Huber  has presented today for surgery, with the diagnosis of BLADDER CA  The various methods of treatment have been discussed with the patient and family. After consideration of risks, benefits and other options for treatment, the patient has consented to  Procedure(s): TRANSURETHRAL RESECTION OF BLADDER TUMOR (TURBT) (N/A) as a surgical intervention .  The patient's history has been reviewed, patient examined, no change in status, stable for surgery. Urine cleared after abx, no dysuria or fever.  I have reviewed the patient's chart and labs.  Questions were answered to the patient's satisfaction.     [name]

## 2015-09-22 ENCOUNTER — Encounter (HOSPITAL_BASED_OUTPATIENT_CLINIC_OR_DEPARTMENT_OTHER): Payer: Self-pay | Admitting: Urology

## 2015-09-22 NOTE — Anesthesia Postprocedure Evaluation (Signed)
Anesthesia Post Note  Patient: Justin Huber  Procedure(s) Performed: Procedure(s) (LRB): TRANSURETHRAL RESECTION OF BLADDER TUMOR (TURBT) less than 2 cm  (N/A)  Patient location during evaluation: PACU Anesthesia Type: General Level of consciousness: sedated and patient cooperative Pain management: pain level controlled Vital Signs Assessment: post-procedure vital signs reviewed and stable Respiratory status: spontaneous breathing Cardiovascular status: stable Anesthetic complications: no     Last Vitals:  Filed Vitals:   09/21/15 0915 09/21/15 0959  BP: 138/67 131/65  Pulse: 62 61  Temp:  36.4 C  Resp: 13 14    Last Pain:  Filed Vitals:   09/22/15 1248  PainSc: 0-No pain   Pain Goal: Patients Stated Pain Goal: 8 (09/21/15 [id number])               Nolon Nations

## 2016-05-10 ENCOUNTER — Other Ambulatory Visit: Payer: Self-pay | Admitting: Physician Assistant

## 2017-06-28 ENCOUNTER — Other Ambulatory Visit: Payer: Self-pay | Admitting: Urology

## 2017-07-05 ENCOUNTER — Encounter (HOSPITAL_BASED_OUTPATIENT_CLINIC_OR_DEPARTMENT_OTHER): Payer: Self-pay | Admitting: *Deleted

## 2017-07-06 ENCOUNTER — Other Ambulatory Visit: Payer: Self-pay

## 2017-07-06 ENCOUNTER — Encounter (HOSPITAL_BASED_OUTPATIENT_CLINIC_OR_DEPARTMENT_OTHER): Payer: Self-pay | Admitting: *Deleted

## 2017-07-06 NOTE — Progress Notes (Signed)
SPOKE W/ PT VIA PHONE FOR PRE-OP INTERVIEW.  NPO AFTER MN.  ARRIVE AT 0930.  NEEDS ISTAT 8 AND EKG.

## 2017-07-09 ENCOUNTER — Other Ambulatory Visit: Payer: Self-pay | Admitting: Urology

## 2017-07-10 ENCOUNTER — Other Ambulatory Visit: Payer: Self-pay | Admitting: Urology

## 2017-07-11 ENCOUNTER — Other Ambulatory Visit: Payer: Self-pay | Admitting: Urology

## 2017-07-12 NOTE — H&P (Signed)
Office Visit Report     06/20/2017   --------------------------------------------------------------------------------   Justin Huber  MRN: [id number]  PRIMARY CARE:  Margarita Rana, MD  DOB: Dec 08, 1950, 67 year old Male  REFERRING:  Margarita Rana, MD  SSN: [id number]  PROVIDER:  Festus Aloe, M.D.    LOCATION:  [facility] [phone]   --------------------------------------------------------------------------------   CC: I have bladder cancer that has been treated.  HPI: Justin Huber is a 67 year-old male established patient who is here for follow-up of bladder cancer treatment.  His bladder cancer was superficial and limitied to the bladder lining. His bladder cancer was not muscle invasive. He had treatment with the following intravesical agents: bcg. Patient denies mitomycin, interferon, and adriamycin.   He did have a TURBT. His last bladder tumor was resected 09/21/2015.   His last cysto was 01/03/2017. His last radiologic test to evaluate the kidneys was 01/15/2017.   Pt with h/o of possible Ta bladder ca (dx in [place]) and f/u bladder diverticulectomy and pelvic lymphadenectomy at [facility] which was benign.   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  -June 2017 -- bbx, fulguration - low grade dysplasia right superior (no CIS), other biopsies benign/inflammation.   Last BCG: Jan 2019; Jun 2018; Apr 2017 completed BCG induction 6/6 - delayed due to incomplete bladder emptying/UTI   Last cytology: May 2018 negative   He's been well. Urine clear. No gross hematuria or cloudiness.      CC: I am having trouble with my erections.  HPI: He first stated noticing pain on approximately 06/02/2014. His symptoms did begin gradually. His symptoms have been stable over the last year.   He does have difficulties achieving an erection. He does have problems maintaining his erections. He has tried Viagra. It did work.     ALLERGIES: No Allergies     MEDICATIONS: Aspirin 81 MG TABS Oral  HydroCHLOROthiazide CAPS Oral  Irbesartan 300 mg tablet  Mobic 7.5 mg tablet Oral  Pravastatin Sodium  Vitamin B-6 TABS Oral  Vitamin D3 2,000 unit tablet Oral     GU PSH: Bladder Instill AntiCA Agent - 04/05/2017, 03/29/2017, 03/22/2017, 09/14/2016, 09/07/2016, 08/31/2016, 02/03/2016, 01/27/2016, 01/20/2016 Cysto Bladder Stone <2.5cm - 2015 Cysto Uretero Lithotripsy - 2015 Cystoscopy - 01/03/2017, 08/16/2016, 05/23/2016, 01/13/2016, 2012 Cystoscopy Insert Stent - 2015, 2015, 2015 Cystoscopy TURBT <2 cm - 09/21/2015 Cystoscopy TURBT 2-5 cm - 03/15/2015 Cystoscopy Ureteroscopy - 2015, 2008 ESWL - 2014 Locm 300-399Mg /Ml Iodine,1Ml - 01/15/2017, 01/24/2016      PSH Notes: Cystoscopy With Fulguration Medium Lesion (2-5cm), Cystoscopy With Ureteroscopy With Lithotripsy, Cystoscopy With Insertion Of Ureteral Stent Right, Cystoscopy With Ureteroscopy Right, Cystoscopy With Insertion Of Ureteral Stent Right, Cystoscopy With Insertion Of Ureteral Stent Right, Cystoscopy With Fragmentation Of Bladder Calculus, Lithotripsy, Cystoscopy (Diagnostic), Cystoscopy Bladder Tumor, Neck Surgery, Rhinoplasty, Cystoscopy With Ureteroscopy, Repair Of Forearm   NON-GU PSH: Reconstruct Nose - 2008    GU PMH: Bladder Cancer Posterior - 03/21/2017, - 01/03/2017, - 08/16/2016, - 05/23/2016, - 01/13/2016, - 10/07/2015 BPH w/LUTS - 03/21/2017, - 01/13/2016, - 10/07/2015, Benign localized prostatic hyperplasia with lower urinary tract symptoms (LUTS), - 02/17/2015 Urinary Urgency - 30/86/5784 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 Elevated PSA - 05/23/2016, - 03/06/2016, - 03/06/2016 Gross hematuria - 03/06/2016, Gross hematuria, - 2017 Chronic cystitis (w/o hematuria) (Worsening) - 11/23/2015, Chronic cystitis, - 08/23/2015 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, Erectile dysfunction due to arterial insufficiency, - 02/17/2015 Bladder  Cancer Lateral, Malignant neoplasm of lateral  wall of urinary bladder - 08/25/2015 CIS of the bladder, Carcinoma in situ of bladder - 08/25/2015 Urinary Retention, Other retention of urine - 08/25/2015 Dysuria, Dysuria - 07/15/2015 Urinary Tract Inf, Unspec site, Pyuria - 07/15/2015 Acute Cystitis/UTI, Acute cystitis without hematuria - 2017 Renal calculus, Bilateral kidney stones - 02/17/2015 History of urolithiasis, Nephrolithiasis - 2014      PMH Notes:  2006-07-04 11:38:20 - Note: Cancer   NON-GU PMH: Bacteriuria, Bacteriuria, asymptomatic - 2017 Encounter for general adult medical examination without abnormal findings, Encounter for preventive health examination - 2017 Abnormal findings on diagnostic imaging of other specified body structures, Abnormal finding on chest xray - 2015 Other specified noninfective disorders of lymphatic vessels and lymph nodes, 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  Social Drinker.  Drinks 1 caffeinated drink per day.     Notes: Former smoker, Alcohol Use, Caffeine Use, Occupation:, Tobacco Use, Marital History - Currently Married, Death In The Family Father   REVIEW OF SYSTEMS:    GU Review Male:   Patient reports frequent urination. Patient denies hard to postpone urination, burning/ pain with urination, get up at night to urinate, leakage of urine, stream starts and stops, trouble starting your stream, have to strain to urinate ,  erection problems, and penile pain.  Gastrointestinal (Upper):   Patient denies nausea, vomiting, and indigestion/ heartburn.  Gastrointestinal (Lower):   Patient denies diarrhea and constipation.  Constitutional:   Patient denies fever, night sweats, weight loss, and fatigue.  Skin:   Patient denies itching and skin rash/ lesion.  Eyes:   Patient denies blurred vision and double vision.  Ears/ Nose/ Throat:   Patient denies sore throat and sinus problems.  Hematologic/Lymphatic:   Patient denies swollen glands and easy bruising.  Cardiovascular:   Patient denies leg swelling and chest pains.  Respiratory:   Patient denies cough and shortness of breath.  Endocrine:   Patient denies excessive thirst.  Musculoskeletal:   Patient denies back pain and joint pain.  Neurological:   Patient denies headaches and dizziness.  Psychologic:   Patient denies depression and anxiety.   VITAL SIGNS:      06/20/2017 09:48 AM  BP 150/89 mmHg  Pulse 68 /min  Temperature 97.4 F / 36.3 C   MULTI-SYSTEM PHYSICAL EXAMINATION:    Constitutional: Well-nourished. No physical deformities. Normally developed. Good grooming.  Neck: Neck symmetrical, not swollen. Normal tracheal position.  Respiratory: No labored breathing, no use of accessory muscles. Normal breath sounds.  Cardiovascular: Regular rate and rhythm. No murmur, no gallop. Normal temperature, normal extremity pulses, no swelling, no varicosities.  Skin: No paleness, no jaundice, no cyanosis. No lesion, no ulcer, no rash.  Neurologic / Psychiatric: Oriented to time, oriented to place, oriented to person. No depression, no anxiety, no agitation.  Gastrointestinal: No mass, no tenderness, no rigidity, non obese abdomen.     PAST DATA REVIEWED:  Source Of History:  Patient   03/14/17 02/11/15  PSA  Total PSA 2.09 ng/mL 1.25     PROCEDURES:         Flexible Cystoscopy - 52000  Risks, benefits, and some of the potential complications of the  procedure were discussed with the patient. All questions were answered. Informed consent was obtained. Antibiotic prophylaxis was given -- Cephalexin. Sterile technique and intraurethral analgesia were used.  Meatus:  Normal size. Normal location. Normal condition.  Urethra:  No strictures.  External Sphincter:  Normal.  Verumontanum:  Normal.  Prostate:  Non-obstructing. No hyperplasia.  Bladder Neck:  Non-obstructing.  Ureteral Orifices:  Normal location. Normal size. Normal shape. Effluxed clear urine.  Bladder:  Erythematous mucosa - scattered throughout, more at dome but stable. No trabeculation. No tumors. No stones. Right diverticula with a patch of erythema stable.       The lower urinary tract was carefully examined. The procedure was well-tolerated and without complications. Antibiotic instructions were given. Instructions were given to call the office immediately for bloody urine, difficulty urinating, painful urination, fever, chills, nausea, vomiting or other illness. The patient stated that he understood these instructions and would comply with them.         Urinalysis w/Scope Dipstick Dipstick Cont'd Micro  Color: Yellow Bilirubin: Neg WBC/hpf: >60/hpf  Appearance: Turbid Ketones: Neg RBC/hpf: 20 - 40/hpf  Specific Gravity: 1.025 Blood: 3+ Bacteria: NS (Not Seen)  pH: 6.0 Protein: 1+ Cystals: NS (Not Seen)  Glucose: Neg Urobilinogen: 0.2 Casts: NS (Not Seen)    Nitrites: Neg Trichomonas: Not Present    Leukocyte Esterase: 3+ Mucous: Not Present      Epithelial Cells: 0 - 5/hpf      Yeast: NS (Not Seen)      Sperm: Not Present    Notes: Microscopic done on unconcentrated urine    ASSESSMENT:      ICD-10 Details  1 GU:   Bladder Cancer Lateral - C67.2   2   Bladder Cancer Posterior - C67.4   3   ED due to arterial insufficiency - N52.01    PLAN:            Medications New Meds: Sildenafil 20 mg tablet take 1-5 tablets PO as needed   #90  0 Refill(s)             Orders Labs Urine Culture, Urine Cytology          Schedule Return Visit/Planned Activity: Next Available Appointment - Schedule Surgery          Document Letter(s):  Created for Patient: Clinical Summary         Notes:   bladder erythema - h/o CIS. He has a a difficult bladder to see (cloudy) and persistent erythema. Discussed nature r/b/a to cysto bbx and he elects to proceed. Urine sent for cx and cytology.   cc: Dr. Edrick Oh     * Signed by Festus Aloe, M.D. on 06/20/17 at 4:50 PM (EDT)*     The information contained in this medical record document is considered private and confidential patient information. This information can only be used for the medical diagnosis and/or medical services that are being provided by the patient's selected caregivers. This information can only be distributed outside of the patient's care if the patient agrees and signs waivers of authorization for this information to be sent to an outside source or route.  Addendum: Urine culture was negative, cytology negative

## 2017-07-13 ENCOUNTER — Other Ambulatory Visit: Payer: Self-pay

## 2017-07-13 ENCOUNTER — Encounter (HOSPITAL_BASED_OUTPATIENT_CLINIC_OR_DEPARTMENT_OTHER)
Admission: RE | Disposition: A | Discharge status: Home / Self Care | Payer: Self-pay | Source: Ambulatory Visit | Attending: Urology

## 2017-07-13 ENCOUNTER — Ambulatory Visit (HOSPITAL_BASED_OUTPATIENT_CLINIC_OR_DEPARTMENT_OTHER): Payer: Medicare HMO | Admitting: Anesthesiology

## 2017-07-13 ENCOUNTER — Ambulatory Visit (HOSPITAL_BASED_OUTPATIENT_CLINIC_OR_DEPARTMENT_OTHER)
Admission: RE | Admit: 2017-07-13 | Discharge: 2017-07-13 | Disposition: A | Discharge status: Home / Self Care | Payer: Medicare HMO | Source: Ambulatory Visit | Attending: Urology | Admitting: Urology

## 2017-07-13 ENCOUNTER — Encounter (HOSPITAL_BASED_OUTPATIENT_CLINIC_OR_DEPARTMENT_OTHER): Payer: Self-pay | Admitting: *Deleted

## 2017-07-13 DIAGNOSIS — Z79899 Other long term (current) drug therapy: Secondary | ICD-10-CM | POA: Diagnosis not present

## 2017-07-13 DIAGNOSIS — Z87891 Personal history of nicotine dependence: Secondary | ICD-10-CM | POA: Diagnosis not present

## 2017-07-13 DIAGNOSIS — Z8551 Personal history of malignant neoplasm of bladder: Secondary | ICD-10-CM | POA: Diagnosis not present

## 2017-07-13 DIAGNOSIS — N323 Diverticulum of bladder: Secondary | ICD-10-CM | POA: Insufficient documentation

## 2017-07-13 DIAGNOSIS — N3020 Other chronic cystitis without hematuria: Secondary | ICD-10-CM | POA: Insufficient documentation

## 2017-07-13 DIAGNOSIS — R338 Other retention of urine: Secondary | ICD-10-CM | POA: Diagnosis not present

## 2017-07-13 DIAGNOSIS — N5201 Erectile dysfunction due to arterial insufficiency: Secondary | ICD-10-CM | POA: Insufficient documentation

## 2017-07-13 DIAGNOSIS — E7800 Pure hypercholesterolemia, unspecified: Secondary | ICD-10-CM | POA: Diagnosis not present

## 2017-07-13 DIAGNOSIS — I10 Essential (primary) hypertension: Secondary | ICD-10-CM | POA: Diagnosis not present

## 2017-07-13 DIAGNOSIS — Z7982 Long term (current) use of aspirin: Secondary | ICD-10-CM | POA: Diagnosis not present

## 2017-07-13 DIAGNOSIS — D303 Benign neoplasm of bladder: Secondary | ICD-10-CM

## 2017-07-13 HISTORY — DX: Personal history of malignant neoplasm of bladder: Z85.51

## 2017-07-13 HISTORY — DX: Other specified disorders of bladder: N32.89

## 2017-07-13 HISTORY — DX: Frequency of micturition: R35.0

## 2017-07-13 HISTORY — DX: Retention of urine, unspecified: R33.9

## 2017-07-13 HISTORY — DX: Nocturia: R35.1

## 2017-07-13 HISTORY — PX: CYSTOSCOPY WITH FULGERATION: SHX6638

## 2017-07-13 LAB — POCT I-STAT, CHEM 8
BUN: 15 mg/dL (ref 6–20)
Calcium, Ion: 1.46 mmol/L — ABNORMAL HIGH (ref 1.15–1.40)
Chloride: 108 mmol/L (ref 101–111)
Creatinine, Ser: 0.6 mg/dL — ABNORMAL LOW (ref 0.61–1.24)
GLUCOSE: 104 mg/dL — AB (ref 65–99)
HCT: 44 % (ref 39.0–52.0)
HEMOGLOBIN: 15 g/dL (ref 13.0–17.0)
POTASSIUM: 3.8 mmol/L (ref 3.5–5.1)
Sodium: 145 mmol/L (ref 135–145)
TCO2: 24 mmol/L (ref 22–32)

## 2017-07-13 SURGERY — CYSTOSCOPY, WITH BLADDER FULGURATION
Anesthesia: General

## 2017-07-13 MED ORDER — VANCOMYCIN HCL IN DEXTROSE 1-5 GM/200ML-% IV SOLN
1000.0000 mg | Freq: Once | INTRAVENOUS | Status: AC
  Administered 2017-07-13: 1000 mg via INTRAVENOUS
  Filled 2017-07-13: qty 200

## 2017-07-13 MED ORDER — GENTAMICIN SULFATE 40 MG/ML IJ SOLN: 5.0000 mg/kg | Freq: Once | INTRAVENOUS | Status: DC

## 2017-07-13 MED ORDER — LIDOCAINE 2% (20 MG/ML) 5 ML SYRINGE
INTRAMUSCULAR | Status: AC
  Filled 2017-07-13: qty 5

## 2017-07-13 MED ORDER — LIDOCAINE 2% (20 MG/ML) 5 ML SYRINGE
INTRAMUSCULAR | Status: DC | PRN
  Administered 2017-07-13: 60 mg via INTRAVENOUS

## 2017-07-13 MED ORDER — FENTANYL CITRATE (PF) 100 MCG/2ML IJ SOLN
25.0000 ug | INTRAMUSCULAR | Status: DC | PRN
  Filled 2017-07-13: qty 1

## 2017-07-13 MED ORDER — ONDANSETRON HCL 4 MG/2ML IJ SOLN
INTRAMUSCULAR | Status: DC | PRN
  Administered 2017-07-13: 4 mg via INTRAVENOUS

## 2017-07-13 MED ORDER — FENTANYL CITRATE (PF) 100 MCG/2ML IJ SOLN
INTRAMUSCULAR | Status: AC
  Filled 2017-07-13: qty 2

## 2017-07-13 MED ORDER — MIDAZOLAM HCL 2 MG/2ML IJ SOLN
INTRAMUSCULAR | Status: AC
  Filled 2017-07-13: qty 2

## 2017-07-13 MED ORDER — PROPOFOL 10 MG/ML IV BOLUS
INTRAVENOUS | Status: DC | PRN
  Administered 2017-07-13: 170 mg via INTRAVENOUS

## 2017-07-13 MED ORDER — ONDANSETRON HCL 4 MG/2ML IJ SOLN
INTRAMUSCULAR | Status: AC
Start: 2017-07-13 — End: ?
  Filled 2017-07-13: qty 2

## 2017-07-13 MED ORDER — PROPOFOL 10 MG/ML IV BOLUS
INTRAVENOUS | Status: AC
  Filled 2017-07-13: qty 20

## 2017-07-13 MED ORDER — GENTAMICIN SULFATE 40 MG/ML IJ SOLN
440.0000 mg | Freq: Once | INTRAVENOUS | Status: AC
  Administered 2017-07-13: 440 mg via INTRAVENOUS
  Filled 2017-07-13 (×2): qty 11

## 2017-07-13 MED ORDER — LACTATED RINGERS IV SOLN
INTRAVENOUS | Status: DC
  Administered 2017-07-13: 11:00:00 via INTRAVENOUS
  Administered 2017-07-13: 1000 mL via INTRAVENOUS
  Filled 2017-07-13: qty 1000

## 2017-07-13 MED ORDER — MIDAZOLAM HCL 2 MG/2ML IJ SOLN
INTRAMUSCULAR | Status: DC | PRN
  Administered 2017-07-13: 2 mg via INTRAVENOUS

## 2017-07-13 MED ORDER — VANCOMYCIN HCL IN DEXTROSE 1-5 GM/200ML-% IV SOLN
INTRAVENOUS | Status: AC
  Filled 2017-07-13: qty 200

## 2017-07-13 MED ORDER — FENTANYL CITRATE (PF) 100 MCG/2ML IJ SOLN
INTRAMUSCULAR | Status: DC | PRN
  Administered 2017-07-13: 50 ug via INTRAVENOUS
  Administered 2017-07-13 (×2): 25 ug via INTRAVENOUS

## 2017-07-13 MED ORDER — DEXAMETHASONE SODIUM PHOSPHATE 10 MG/ML IJ SOLN
INTRAMUSCULAR | Status: DC | PRN
  Administered 2017-07-13: 10 mg via INTRAVENOUS

## 2017-07-13 MED ORDER — DEXAMETHASONE SODIUM PHOSPHATE 10 MG/ML IJ SOLN
INTRAMUSCULAR | Status: AC
  Filled 2017-07-13: qty 1

## 2017-07-13 SURGICAL SUPPLY — 13 items
BAG DRAIN URO-CYSTO SKYTR STRL (DRAIN) ×2 IMPLANT
BAG DRN UROCATH (DRAIN) ×1
CLOTH BEACON ORANGE TIMEOUT ST (SAFETY) ×3 IMPLANT
CYSVIEW 100 KIT (MISCELLANEOUS) ×2 IMPLANT
ELECT REM PT RETURN 9FT ADLT (ELECTROSURGICAL) ×3
ELECTRODE REM PT RTRN 9FT ADLT (ELECTROSURGICAL) ×1 IMPLANT
GLOVE BIO SURGEON STRL SZ7.5 (GLOVE) ×2 IMPLANT
GOWN STRL REUS W/TWL LRG LVL3 (GOWN DISPOSABLE) ×4 IMPLANT
KIT TURNOVER CYSTO (KITS) ×2 IMPLANT
MANIFOLD NEPTUNE II (INSTRUMENTS) ×2 IMPLANT
PACK CYSTO (CUSTOM PROCEDURE TRAY) ×2 IMPLANT
TUBE CONNECTING 12'X1/4 (SUCTIONS) ×1
TUBE CONNECTING 12X1/4 (SUCTIONS) ×1 IMPLANT

## 2017-07-13 NOTE — Progress Notes (Signed)
Instilled 50 ml cysview solution preop into bladder without difficulty with Doree Fudge RN,Lacrecia Delval RN, and Trude Mcburney RN,CCRN. Pt tolerated well.

## 2017-07-13 NOTE — Discharge Instructions (Signed)
Indwelling Urinary Catheter Care, Adult Take good care of your catheter to keep it working and to prevent problems. How to wear your catheter Attach your catheter to your leg with tape (adhesive tape) or a leg strap. Make sure it is not too tight. If you use tape, remove any bits of tape that are already on the catheter. How to wear a drainage bag You should have:  A large overnight bag.  A small leg bag.  Overnight Bag You may wear the overnight bag at any time. Always keep the bag below the level of your bladder but off the floor. When you sleep, put a clean plastic bag in a wastebasket. Then hang the bag inside the wastebasket. Leg Bag Never wear the leg bag at night. Always wear the leg bag below your knee. Keep the leg bag secure with a leg strap or tape. How to care for your skin  Clean the skin around the catheter at least once every day.  Shower every day. Do not take baths.  Put creams, lotions, or ointments on your genital area only as told by your doctor.  Do not use powders, sprays, or lotions on your genital area. How to clean your catheter and your skin 1. Wash your hands with soap and water. 2. Wet a washcloth in warm water and gentle (mild) soap. 3. Use the washcloth to clean the skin where the catheter enters your body. Clean downward and wipe away from the catheter in small circles. Do not wipe toward the catheter. 4. Pat the area dry with a clean towel. Make sure to clean off all soap. How to care for your drainage bags Empty your drainage bag when it is ?- full or at least 2-3 times a day. Replace your drainage bag once a month or sooner if it starts to smell bad or look dirty. Do not clean your drainage bag unless told by your doctor. Emptying a drainage bag  Supplies Needed  Rubbing alcohol.  Gauze pad or cotton ball.  Tape or a leg strap.  Steps 1. Wash your hands with soap and water. 2. Separate (detach) the bag from your leg. 3. Hold the bag over  the toilet or a clean container. Keep the bag below your hips and bladder. This stops pee (urine) from going back into the tube. 4. Open the pour spout at the bottom of the bag. 5. Empty the pee into the toilet or container. Do not let the pour spout touch any surface. 6. Put rubbing alcohol on a gauze pad or cotton ball. 7. Use the gauze pad or cotton ball to clean the pour spout. 8. Close the pour spout. 9. Attach the bag to your leg with tape or a leg strap. 10. Wash your hands.  Changing a drainage bag Supplies Needed  Alcohol wipes.  A clean drainage bag.  Adhesive tape or a leg strap.  Steps 1. Wash your hands with soap and water. 2. Separate the dirty bag from your leg. 3. Pinch the rubber catheter with your fingers so that pee does not spill out. 4. Separate the catheter tube from the drainage tube where these tubes connect (at the connection valve). Do not let the tubes touch any surface. 5. Clean the end of the catheter tube with an alcohol wipe. Use a different alcohol wipe to clean the end of the drainage tube. 6. Connect the catheter tube to the drainage tube of the clean bag. 7. Attach the new bag to  the leg with adhesive tape or a leg strap. 8. Wash your hands.  How to prevent infection and other problems  Never pull on your catheter or try to remove it. Pulling can damage tissue in your body.  Always wash your hands before and after touching your catheter.  If a leg strap gets wet, replace it with a dry one.  Drink enough fluids to keep your pee clear or pale yellow, or as told by your doctor.  Do not let the drainage bag or tubing touch the floor.  Wear cotton underwear.  If you are male, wipe from front to back after you poop (have a bowel movement).  Check on the catheter often to make sure it works and the tubing is not twisted. Get help if:  Your pee is cloudy.  Your pee smells unusually bad.  Your pee is not draining into the bag.  Your  tube gets clogged.  Your catheter starts to leak.  Your bladder feels full. Get help right away if:  You have redness, swelling, or pain where the catheter enters your body.  You have fluid, pus, or a bad smell coming from the area where the catheter enters your body.  The area where the catheter enters your body feels warm.  You have a fever.  You have pain in your: ? Stomach (abdomen). ? Legs. ? Lower back. ? Bladder.  You see blood fill the catheter.  Your pee is pink or red.  You feel sick to your stomach (nauseous).  You throw up (vomit).  You have chills.  Your catheter gets pulled out. This information is not intended to replace advice given to you by your health care provider. Make sure you discuss any questions you have with your health care provider. Document Released: 07/15/2012 Document Revised: 02/16/2016 Document Reviewed: 09/02/2013 Elsevier Interactive Patient Education  2018 [employer].   Cystoscopy, Care After Refer to this sheet in the next few weeks. These instructions provide you with information about caring for yourself after your procedure. Your health care provider may also give you more specific instructions. Your treatment has been planned according to current medical practices, but problems sometimes occur. Call your health care provider if you have any problems or questions after your procedure. What can I expect after the procedure? After the procedure, it is common to have:  Mild pain when you urinate. Pain should stop within a few minutes after you urinate. This may last for up to 1 week.  A small amount of blood in your urine for several days.  Feeling like you need to urinate but producing only a small amount of urine.  Follow these instructions at home:  Medicines  Take over-the-counter and prescription medicines only as told by your health care provider.  If you were prescribed an antibiotic medicine, take it as told by your  health care provider. Do not stop taking the antibiotic even if you start to feel better. General instructions   Return to your normal activities as told by your health care provider. Ask your health care provider what activities are safe for you.  Do not drive for 24 hours if you received a sedative.  Watch for any blood in your urine. If the amount of blood in your urine increases, call your health care provider.  Follow instructions from your health care provider about eating or drinking restrictions.  If a tissue sample was removed for testing (biopsy) during your procedure, it is your responsibility  to get your test results. Ask your health care provider or the department performing the test when your results will be ready.  Drink enough fluid to keep your urine clear or pale yellow.  Keep all follow-up visits as told by your health care provider. This is important. Contact a health care provider if:  You have pain that gets worse or does not get better with medicine, especially pain when you urinate.  You have difficulty urinating. Get help right away if:  You have more blood in your urine.  You have blood clots in your urine.  You have abdominal pain.  You have a fever or chills.  You are unable to urinate. This information is not intended to replace advice given to you by your health care provider. Make sure you discuss any questions you have with your health care provider. Document Released: 10/07/2004 Document Revised: 08/26/2015 Document Reviewed: 02/04/2015 Elsevier Interactive Patient Education  2018 [place] [facility] Instructions  Activity: Get plenty of rest for the remainder of the day. A responsible individual must stay with you for 24 hours following the procedure.  For the next 24 hours, DO NOT: -Drive a car -Paediatric nurse -Drink alcoholic beverages -Take any medication unless instructed by your physician -Make any  legal decisions or sign important papers.  Meals: Start with liquid foods such as gelatin or soup. Progress to regular foods as tolerated. Avoid greasy, spicy, heavy foods. If nausea and/or vomiting occur, drink only clear liquids until the nausea and/or vomiting subsides. Call your physician if vomiting continues.  Special Instructions/Symptoms: Your throat may feel dry or sore from the anesthesia or the breathing tube placed in your throat during surgery. If this causes discomfort, gargle with warm salt water. The discomfort should disappear within 24 hours.

## 2017-07-13 NOTE — Anesthesia Procedure Notes (Signed)
Procedure Name: LMA Insertion Date/Time: 07/13/2017 11:42 AM Performed by: Suan Halter, CRNA Pre-anesthesia Checklist: Patient identified, Emergency Drugs available, Suction available and Patient being monitored Patient Re-evaluated:Patient Re-evaluated prior to induction Oxygen Delivery Method: Circle system utilized Preoxygenation: Pre-oxygenation with 100% oxygen Induction Type: IV induction Ventilation: Mask ventilation without difficulty LMA: LMA inserted LMA Size: 5.0 Number of attempts: 1 Airway Equipment and Method: Bite block Placement Confirmation: positive ETCO2 Tube secured with: Tape Dental Injury: Teeth and Oropharynx as per pre-operative assessment

## 2017-07-13 NOTE — Transfer of Care (Signed)
Immediate Anesthesia Transfer of Care Note  Patient: Justin Huber  Procedure(s) Performed: Blue light CYSTOSCOPY with cysview WITH FULGERATION/ BLADDER BIOPSY (N/A )  Patient Location: PACU  Anesthesia Type:General  Level of Consciousness: awake, alert  and oriented  Airway & Oxygen Therapy: Patient Spontanous Breathing and Patient connected to nasal cannula oxygen  Post-op Assessment: Report given to RN and Post -op Vital signs reviewed and stable  Post vital signs: Reviewed and stable  Last Vitals:  Vitals Value Taken Time  BP    Temp    Pulse    Resp    SpO2      Last Pain:  Vitals:   07/13/17 1017  TempSrc:   PainSc: 0-No pain      Patients Stated Pain Goal: 6 (54/62/70 3500)  Complications: No apparent anesthesia complications

## 2017-07-13 NOTE — Anesthesia Preprocedure Evaluation (Signed)
Anesthesia Evaluation  Patient identified by MRN, date of birth, ID band Patient awake    Reviewed: Allergy & Precautions, H&P , NPO status , Patient's Chart, lab work & pertinent test results  Airway Mallampati: II  TM Distance: >3 FB Neck ROM: Full    Dental no notable dental hx. (+) Teeth Intact, Dental Advisory Given   Pulmonary neg pulmonary ROS, former smoker,    Pulmonary exam normal breath sounds clear to auscultation       Cardiovascular hypertension, Pt. on medications Normal cardiovascular exam Rhythm:Regular Rate:Normal     Neuro/Psych negative neurological ROS  negative psych ROS   GI/Hepatic negative GI ROS, Neg liver ROS,   Endo/Other  negative endocrine ROS  Renal/GU Renal disease     Musculoskeletal negative musculoskeletal ROS (+)   Abdominal   Peds  Hematology negative hematology ROS (+)   Anesthesia Other Findings   Reproductive/Obstetrics negative OB ROS                             Anesthesia Physical  Anesthesia Plan  ASA: II  Anesthesia Plan: General   Post-op Pain Management:    Induction: Intravenous  PONV Risk Score and Plan: 2 and Ondansetron, Dexamethasone and Treatment may vary due to age or medical condition  Airway Management Planned: LMA  Additional Equipment:   Intra-op Plan:   Post-operative Plan: Extubation in OR  Informed Consent: I have reviewed the patients History and Physical, chart, labs and discussed the procedure including the risks, benefits and alternatives for the proposed anesthesia with the patient or authorized representative who has indicated his/her understanding and acceptance.   Dental advisory given  Plan Discussed with: CRNA  Anesthesia Plan Comments:         Anesthesia Quick Evaluation

## 2017-07-13 NOTE — Interval H&P Note (Signed)
History and Physical Interval Note:  07/13/2017 11:16 AM  Justin Huber  has presented today for surgery, with the diagnosis of BLADDER ERYTHEMA  The various methods of treatment have been discussed with the patient and family. After consideration of risks, benefits and other options for treatment, the patient has consented to  Procedure(s): Blue light CYSTOSCOPY with cysview WITH FULGERATION/ BLADDER BIOPSY (N/A) as a surgical intervention .  He denies dysuria, gross hematuria or fever.  He has noticed a weak stream and more trouble emptying. The patient's history has been reviewed, patient examined, no change in status, stable for surgery.  I discussed with the patient and his wife the nature, potential benefits, risks and alternatives to above procedures/Cysview, including side effects of the proposed treatment, the likelihood of the patient achieving the goals of the procedure, and any potential problems that might occur during the procedure or recuperation.  We also discussed we will evaluate the prostate to consider a prostate procedure in the future. We discussed a prostate procedure may help his emptying, but he would be at higher risk for failure due to the chronic retention and hypotonicity of his bladder.  We also discussed risk of incontinence and stricture among others when considering prostate procedures.  All questions answered. Patient elects to proceed.  I have reviewed the patient's chart and labs.  Questions were answered to the patient's satisfaction.     Festus Aloe

## 2017-07-13 NOTE — Op Note (Signed)
Preoperative diagnosis: Bladder erythema, bladder diverticulum Postoperative diagnosis: Same  Procedure: Cysview instillation, cystoscopy with bladder biopsy and fulguration 2-5 cm  Surgeon: Junious Silk  Anesthesia: General  Indication for procedure: 67 year old white male with a history of chronic urinary retention, bladder CIS and bladder diverticulum.  He has been undergoing BCG treatments and recently had increased frequency with diffuse erythema of the bladder.  He was brought for exam and cystoscopy under anesthesia, bladder biopsy and fulguration.  Findings: On exam under anesthesia the penis is circumcised and without mass or lesion, the testicles were descended bilaterally and palpably normal.  On digital rectal exam the prostate was about 30 g and smooth without hard area or nodule.  Cystoscopy revealed normal urethra, prostatic urethra was obstructed by lateral lobe hypertrophy.  He would be a good UroLift candidate.  Inspection of the bladder revealed diffuse erythema and edema mainly posteriorly from the left ovary to the right the dome and anterior and trigone were preserved.  There was a right diverticulum with some erythematous areas and side.  The bladder was inspected with the white light and the blue light.  The areas of erythema and edema appeared red under the blue light and I did several biopsies.  Of all the representative areas of the left, posterior, right, right diverticulum.  The mucosa bled easily especially when distended.  Hemostasis was ensured at low pressure and the bladder was filled.  I reinspected under the white light and the blue light.  The scope was removed and an 79 [place] coud catheter was placed and left to gravity drainage.  Complications: None Blood loss: Minimal  Specimens to pathology: #1 left posterior bladder biopsy #2 posterior #3 right posterior #4 right diverticulum #5 left lateral inferior  Drains: 18 Fr coude catheter   Dispositon: stable  to PACU

## 2017-07-16 ENCOUNTER — Encounter (HOSPITAL_BASED_OUTPATIENT_CLINIC_OR_DEPARTMENT_OTHER): Payer: Self-pay | Admitting: Urology

## 2017-07-17 NOTE — Anesthesia Postprocedure Evaluation (Signed)
Anesthesia Post Note  Patient: Justin Huber  Procedure(s) Performed: Blue light CYSTOSCOPY with cysview WITH FULGERATION/ BLADDER BIOPSY (N/A )     Patient location during evaluation: PACU Anesthesia Type: General Level of consciousness: awake and alert Pain management: pain level controlled Vital Signs Assessment: post-procedure vital signs reviewed and stable Respiratory status: spontaneous breathing, nonlabored ventilation, respiratory function stable and patient connected to nasal cannula oxygen Cardiovascular status: blood pressure returned to baseline and stable Postop Assessment: no apparent nausea or vomiting Anesthetic complications: no    Last Vitals:  Vitals:   07/13/17 1345 07/13/17 1436  BP: (!) 154/78 139/68  Pulse: 68 64  Resp: 11 14  Temp:  (!) 36.3 C  SpO2: 96% 96%    Last Pain:  Vitals:   07/13/17 1430  TempSrc:   PainSc: 0-No pain                 Tiajuana Amass

## 2018-05-28 ENCOUNTER — Other Ambulatory Visit: Payer: Self-pay | Admitting: Neurological Surgery

## 2018-07-09 ENCOUNTER — Encounter (HOSPITAL_COMMUNITY): Payer: Self-pay

## 2018-07-09 ENCOUNTER — Inpatient Hospital Stay (HOSPITAL_COMMUNITY): Admit: 2018-07-09 | Payer: Medicare Other | Admitting: Neurological Surgery

## 2018-07-09 SURGERY — POSTERIOR LUMBAR FUSION 1 LEVEL
Anesthesia: General | Site: Back

## 2018-11-15 ENCOUNTER — Other Ambulatory Visit: Payer: Self-pay | Admitting: Family Medicine

## 2018-11-15 ENCOUNTER — Other Ambulatory Visit (HOSPITAL_COMMUNITY): Payer: Self-pay | Admitting: Family Medicine

## 2018-11-22 ENCOUNTER — Encounter (HOSPITAL_COMMUNITY): Payer: Self-pay

## 2018-11-22 ENCOUNTER — Ambulatory Visit (HOSPITAL_COMMUNITY)
Admission: RE | Admit: 2018-11-22 | Discharge: 2018-11-22 | Disposition: A | Discharge status: Home / Self Care | Payer: Medicare HMO | Source: Ambulatory Visit | Attending: Family Medicine | Admitting: Family Medicine

## 2018-11-22 ENCOUNTER — Ambulatory Visit (HOSPITAL_COMMUNITY): Admission: RE | Admit: 2018-11-22 | Payer: Medicare HMO | Source: Ambulatory Visit

## 2018-11-22 ENCOUNTER — Other Ambulatory Visit (HOSPITAL_COMMUNITY): Payer: Medicare Other

## 2018-11-22 ENCOUNTER — Other Ambulatory Visit: Payer: Self-pay

## 2018-11-22 MED ORDER — TECHNETIUM TC 99M SESTAMIBI - CARDIOLITE
26.1000 | Freq: Once | INTRAVENOUS | Status: AC | PRN
  Administered 2018-11-22: 10:00:00 26.1 via INTRAVENOUS

## 2019-04-27 ENCOUNTER — Ambulatory Visit: Payer: Medicare HMO | Attending: Internal Medicine

## 2019-04-27 DIAGNOSIS — Z23 Encounter for immunization: Secondary | ICD-10-CM | POA: Insufficient documentation

## 2019-04-27 NOTE — Progress Notes (Signed)
   Covid-19 Vaccination Clinic  Name:  Justin Huber    MRN: [id number] DOB: 10/18/1950  04/27/2019  [name] was observed post Covid-19 immunization for 30 minutes based on pre-vaccination screening without incidence. He was provided with Vaccine Information Sheet and instruction to access the V-Safe system.   [name] was instructed to call 911 with any severe reactions post vaccine: Marland Kitchen Difficulty breathing  . Swelling of your face and throat  . A fast heartbeat  . A bad rash all over your body  . Dizziness and weakness    Immunizations Administered    Name Date Dose VIS Date Route   Pfizer COVID-19 Vaccine 04/27/2019 10:14 AM 0.3 mL 03/14/2019 Intramuscular   Manufacturer: [place]   Lot: [id number]   [place]: [id number]

## 2019-05-06 ENCOUNTER — Ambulatory Visit: Payer: Medicare Other

## 2019-05-16 ENCOUNTER — Ambulatory Visit: Payer: Medicare HMO | Attending: Internal Medicine

## 2019-05-16 DIAGNOSIS — Z23 Encounter for immunization: Secondary | ICD-10-CM

## 2019-05-16 NOTE — Progress Notes (Signed)
   Covid-19 Vaccination Clinic  Name:  Justin Huber    MRN: [id number] DOB: 07/07/1950  05/16/2019  [name] was observed post Covid-19 immunization for 15 minutes without incidence. He was provided with Vaccine Information Sheet and instruction to access the V-Safe system.   [name] was instructed to call 911 with any severe reactions post vaccine: Marland Kitchen Difficulty breathing  . Swelling of your face and throat  . A fast heartbeat  . A bad rash all over your body  . Dizziness and weakness    Immunizations Administered    Name Date Dose VIS Date Route   Pfizer COVID-19 Vaccine 05/16/2019  9:03 AM 0.3 mL 03/14/2019 Intramuscular   Manufacturer: [place]   Lot: [id number]   [place]: [id number]

## 2019-09-29 ENCOUNTER — Other Ambulatory Visit: Payer: Self-pay | Admitting: Otolaryngology

## 2019-10-21 NOTE — H&P (Signed)
HPI:   Justin Huber is a 69 y.o. male who presents as a consult Patient.   Referring Provider: Earmon Phoenix, MD  Chief complaint: Hyperparathyroidism.  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 History of bladder cancer treated surgically and with BCG about 8 years ago. History of kidney stones for about 15 years. Recently identified hypercalcemia. Parathyroid hormone was elevated and sestamibi scan revealed abnormal signal in the right inferior parathyroid area.  PMH/Meds/All/SocHx/FamHx/ROS:   Past Medical History:  Diagnosis Date  . Arthritis  . Bladder cancer ([place])  . Hypertension  . Non Hodgkin's lymphoma [facility])   Past Surgical History:  Procedure Laterality Date  . BLADDER BIOPSY  . BLADDER TUMOR EXCISION   No family history of bleeding disorders, wound healing problems or difficulty with anesthesia.   Social History   Socioeconomic History  . Marital status: Unknown  Spouse name: Not on file  . Number of children: Not on file  . Years of education: Not on file  . Highest education level: Not on file  Occupational History  . Not on file  Social Needs  . Financial resource strain: Not on file  . Food insecurity  Worry: Not on file  Inability: Not on file  . Transportation needs  Medical: Not on file  Non-medical: Not on file  Tobacco Use  . Smoking status: Former Research scientist (life sciences)  . Smokeless tobacco: Never Used  Substance and Sexual Activity  . Alcohol use: Yes  . Drug use: Never  . Sexual activity: Not on file  Lifestyle  . Physical activity  Days per week: Not on file  Minutes per session: Not on file  . Stress: Not on file  Relationships  . Social Medical illustrator on phone: Not on file  Gets together: Not on file  Attends religious service: Not on file  Active member of club or organization: Not on file  Attends meetings of clubs or organizations: Not on file  Relationship status: Not on file  Other  Topics Concern  . Not on file  Social History Narrative  . Not on file   Current Outpatient Medications:  . acetaminophen (TYLENOL) 500 MG tablet, Take 1,000 mg by mouth., Disp: , Rfl:  . aspirin 81 MG EC tablet *ANTIPLATELET*, Take 81 mg by mouth., Disp: , Rfl:  . cholecalciferol, vitamin D3, 50 mcg (2,000 unit) Cap, Take by mouth., Disp: , Rfl:  . fexofenadine (ALLEGRA) 180 MG tablet, Take 180 mg by mouth., Disp: , Rfl:  . hydroCHLOROthiazide (HYDRODIURIL) 25 MG tablet, , Disp: , Rfl:  . irbesartan (AVAPRO) 150 MG tablet, TAKE ONE TABLET BY MOUTH AT BEDTIME, Disp: , Rfl:  . meloxicam (MOBIC) 7.5 MG tablet, , Disp: , Rfl:  . pravastatin (PRAVACHOL) 20 MG tablet, , Disp: , Rfl:  . vitamin B complex-folic acid 0.4 mg Tab, Take by mouth., Disp: , Rfl:   A complete ROS was performed with pertinent positives/negatives noted in the HPI. The remainder of the ROS are negative.   Physical Exam:   Temp 97.1 F (36.2 C)  Ht 1.854 m (6\' 1" )  Wt 90.7 kg (200 lb)  BMI 26.39 kg/m   General: Healthy and alert, in no distress, breathing easily. Normal affect. In a pleasant mood. Head: Normocephalic, atraumatic. No masses, or scars. Eyes: Pupils are equal, and reactive to light. Vision is grossly intact. No spontaneous or gaze nystagmus. Ears: Ear canals are clear. Tympanic membranes are intact, with normal landmarks  and the middle ears are clear and healthy. Hearing: Grossly normal. Nose: Nasal cavities are clear with healthy mucosa, no polyps or exudate. Airways are patent. Face: No masses or scars, facial nerve function is symmetric. Oral Cavity: No mucosal abnormalities are noted. Tongue with normal mobility. Dentition appears healthy. Oropharynx: Tonsils are symmetric. There are no mucosal masses identified. Tongue base appears normal and healthy. Larynx/Hypopharynx: indirect exam reveals healthy, mobile vocal cords, without mucosal lesions in the hypopharynx or larynx. Chest:  Deferred Neck: No palpable masses, no cervical adenopathy, no thyroid nodules or enlargement. Surgical scar right lower neck obliquely. Neuro: Cranial nerves II-XII with normal function. Balance: Normal gate. Other findings: none.  Independent Review of Additional Tests or Records:  none  Procedures:  none  Impression & Plans:  Hypercalcemia with hyperparathyroidism, and abnormal sestamibi scan right inferior. We discussed options including observation versus parathyroid exploration. Risks and benefits of surgery were discussed in detail specifically wound complications, anesthesia complications, recurrent nerve injury. We discussed the need for possible revision surgery. He will call when he is ready to schedule. All questions were answered.

## 2019-10-25 IMAGING — NM NUCLEAR MEDICINE PARATHYROID WITH SPEC
2 series · 12 of 12 positions shown · non-contrast
Comparison: None

CLINICAL DATA: Hypercalcemia with Ca = 11.1, abnormal parathormone
level with PTH = 105

EXAM:
NM PARATHYROID SCINTIGRAPHY AND SPECT IMAGING
TECHNIQUE: Following intravenous administration of radiopharmaceutical, early
and 2-hour delayed planar images were obtained in the anterior
projection. Delayed triplanar SPECT images were also obtained at 2
hours.
RADIOPHARMACEUTICALS:  26.1 mCi Mc-66m Sestamibi IV

[Series 1: spect - (id)_(id)_cor · 4.1mm · 4.14mm/px · 6 of 128 frames shown]
[frame 11/128]
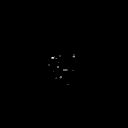
[frame 32/128]
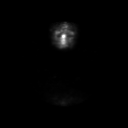
[frame 54/128]
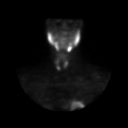
[frame 75/128]
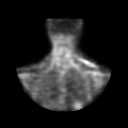
[frame 96/128]
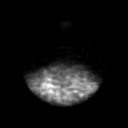
[frame 118/128]
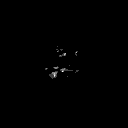

[Series 1: spect - (id)_(id)_tra · 4.1mm · 4.14mm/px · 6 of 128 frames shown]
[frame 11/128]
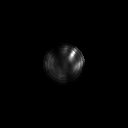
[frame 32/128]
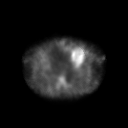
[frame 54/128]
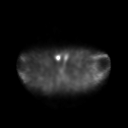
[frame 75/128]
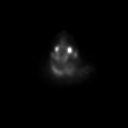
[frame 96/128]
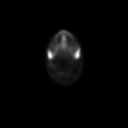
[frame 118/128]
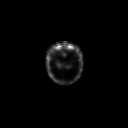

[12 of 12 positions shown; findings below may reference images not displayed]

FINDINGS: Planar imaging: Initial distribution of sestamibi throughout both
thyroid lobes with slightly greater uptake in LEFT lobe versus
RIGHT. Moderate residual tracer within both thyroid lobes on delayed
images at 2 hours. No definite focal abnormal tracer retention.

SPECT imaging: Focal abnormal sestamibi retention at the inferior
pole of the RIGHT thyroid lobe suspicious for a RIGHT inferior
parathyroid adenoma. Remaining residual tracer throughout the
thyroid lobes is otherwise fairly symmetric. No ectopic localization
of tracer in the mediastinum.
IMPRESSION: Abnormal retention of sestamibi at the inferior pole of the RIGHT
thyroid lobe suspicious for a RIGHT inferior parathyroid adenoma.

## 2019-10-29 ENCOUNTER — Other Ambulatory Visit: Payer: Self-pay | Admitting: Urology

## 2019-11-04 NOTE — Progress Notes (Signed)
Your procedure is scheduled on Wednesday August 11.  Report to [facility] Main Entrance "A" at 06:30 A.M. A.M., and check in at the Admitting office.  Call this number if you have problems the morning of surgery: [phone]  Call [phone] if you have any questions prior to your surgery date Monday-Friday 8am-4pm   Remember: Do not eat or drink after midnight the night before your surgery  Take these medicines the morning of surgery with A SIP OF WATER: fexofenadine (ALLEGRA)  fluticasone (FLONASE)  If needed: acetaminophen (TYLENOL)   As of today, STOP taking any Aspirin containing products, meloxicam (MOBIC), Aleve, Naproxen, Ibuprofen, Motrin, Advil, Goody's, BC's, all herbal medications, fish oil, and all vitamins.    The Morning of Surgery  Do not wear jewelry  Do not wear lotions, powders, colognes, or deodorant  Men may shave face and neck.  Do not bring valuables to the hospital.  [facility] is not responsible for any belongings or valuables.  If you are a smoker, DO NOT Smoke 24 hours prior to surgery  If you wear a CPAP at night please bring your mask the morning of surgery   Remember that you must have someone to transport you home after your surgery, and remain with you for 24 hours if you are discharged the same day.   Please bring cases for contacts, glasses, hearing aids, dentures or bridgework because it cannot be worn into surgery.    Leave your suitcase in the car.  After surgery it may be brought to your room.  For patients admitted to the hospital, discharge time will be determined by your treatment team.  Patients discharged the day of surgery will not be allowed to drive home.    Special instructions:   [place]- Preparing For Surgery  Before surgery, you can play an important role. Because skin is not sterile, your skin needs to be as free of germs as possible. You can reduce the number of germs on your skin by washing with CHG  (chlorahexidine gluconate) Soap before surgery.  CHG is an antiseptic cleaner which kills germs and bonds with the skin to continue killing germs even after washing.    Oral Hygiene is also important to reduce your risk of infection.  Remember - BRUSH YOUR TEETH THE MORNING OF SURGERY WITH YOUR REGULAR TOOTHPASTE  Please do not use if you have an allergy to CHG or antibacterial soaps. If your skin becomes reddened/irritated stop using the CHG.  Do not shave (including legs and underarms) for at least 48 hours prior to first CHG shower. It is OK to shave your face.  Please follow these instructions carefully.   1. Shower the NIGHT BEFORE SURGERY and the MORNING OF SURGERY with CHG Soap.   2. If you chose to wash your hair and body, wash as usual with your normal shampoo and body-wash/soap.  3. Rinse your hair and body thoroughly to remove the shampoo and soap.  4. Apply CHG directly to the skin (ONLY FROM THE NECK DOWN) and wash gently with a scrungie or a clean washcloth.   5. Do not use on open wounds or open sores. Avoid contact with your eyes, ears, mouth and genitals (private parts). Wash Face and genitals (private parts)  with your normal soap.   6. Wash thoroughly, paying special attention to the area where your surgery will be performed.  7. Thoroughly rinse your body with warm water from the neck down.  8. DO NOT  shower/wash with your normal soap after using and rinsing off the CHG Soap.  9. Pat yourself dry with a CLEAN TOWEL.  10. Wear CLEAN PAJAMAS to bed the night before surgery  11. Place CLEAN SHEETS on your bed the night of your first shower and DO NOT SLEEP WITH PETS.  12. Wear comfortable clothes the morning of surgery.     Day of Surgery:  Please shower the morning of surgery with the CHG soap Do not apply any deodorants/lotions. Please wear clean clothes to the hospital/surgery center.   Remember to brush your teeth WITH YOUR REGULAR TOOTHPASTE.   Please  read over the following fact sheets that you were given.

## 2019-11-05 ENCOUNTER — Other Ambulatory Visit: Payer: Self-pay

## 2019-11-05 ENCOUNTER — Encounter (HOSPITAL_COMMUNITY): Payer: Self-pay

## 2019-11-05 ENCOUNTER — Encounter (HOSPITAL_COMMUNITY)
Admission: RE | Admit: 2019-11-05 | Discharge: 2019-11-05 | Disposition: A | Discharge status: Home / Self Care | Payer: Medicare HMO | Source: Ambulatory Visit | Attending: Otolaryngology | Admitting: Otolaryngology

## 2019-11-05 DIAGNOSIS — Z01818 Encounter for other preprocedural examination: Secondary | ICD-10-CM | POA: Diagnosis not present

## 2019-11-05 LAB — BASIC METABOLIC PANEL
Anion gap: 8 (ref 5–15)
BUN: 15 mg/dL (ref 8–23)
CO2: 26 mmol/L (ref 22–32)
Calcium: 10.7 mg/dL — ABNORMAL HIGH (ref 8.9–10.3)
Chloride: 103 mmol/L (ref 98–111)
Creatinine, Ser: 0.76 mg/dL (ref 0.61–1.24)
GFR calc Af Amer: >60 mL/min (ref >60)
GFR calc non Af Amer: >60 mL/min (ref >60)
Glucose, Bld: 110 mg/dL — ABNORMAL HIGH (ref 70–99)
Potassium: 3.3 mmol/L — ABNORMAL LOW (ref 3.5–5.1)
Sodium: 137 mmol/L (ref 135–145)

## 2019-11-05 LAB — CBC
HCT: 43.2 % (ref 39.0–52.0)
Hemoglobin: 14.6 g/dL (ref 13.0–17.0)
MCH: 29.9 pg (ref 26.0–34.0)
MCHC: 33.8 g/dL (ref 30.0–36.0)
MCV: 88.3 fL (ref 80.0–100.0)
Platelets: 259 10*3/uL (ref 150–400)
RBC: 4.89 MIL/uL (ref 4.22–5.81)
RDW: 12 % (ref 11.5–15.5)
WBC: 9.6 10*3/uL (ref 4.0–10.5)
nRBC: 0 % (ref 0.0–0.2)

## 2019-11-05 NOTE — Progress Notes (Signed)
PCP - Dr. Coletta Memos Cardiologist - Patient Denies  PPM/ICD - n/a Device Orders -  Rep Notified -   Chest x-ray - n/a EKG - 11/05/2019 Stress Test - 20+ years ago ECHO - Patient Denies Cardiac Cath -Patient Denies   Sleep Study - Patient Denies CPAP -   Fasting Blood Sugar - n/a Checks Blood Sugar _____ times a day  Blood Thinner Instructions: Patient Denies Aspirin Instructions: last dose 11/04/2019  ERAS Protcol - n/a PRE-SURGERY Ensure or G2-   COVID TEST- Monday 11/10/19   Anesthesia review: n/a  Patient denies shortness of breath, fever, cough and chest pain at PAT appointment   All instructions explained to the patient, with a verbal understanding of the material. Patient agrees to go over the instructions while at home for a better understanding. Patient also instructed to self quarantine after being tested for COVID-19. The opportunity to ask questions was provided.

## 2019-11-10 ENCOUNTER — Other Ambulatory Visit (HOSPITAL_COMMUNITY)
Admission: RE | Admit: 2019-11-10 | Discharge: 2019-11-10 | Disposition: A | Discharge status: Home / Self Care | Payer: Medicare HMO | Source: Ambulatory Visit | Attending: Otolaryngology | Admitting: Otolaryngology

## 2019-11-10 DIAGNOSIS — Z01812 Encounter for preprocedural laboratory examination: Secondary | ICD-10-CM | POA: Insufficient documentation

## 2019-11-10 DIAGNOSIS — Z20822 Contact with and (suspected) exposure to covid-19: Secondary | ICD-10-CM | POA: Insufficient documentation

## 2019-11-10 LAB — SARS CORONAVIRUS 2 (TAT 6-24 HRS): SARS Coronavirus 2: NEGATIVE

## 2019-11-12 ENCOUNTER — Encounter (HOSPITAL_COMMUNITY)
Admission: RE | Disposition: A | Discharge status: Home / Self Care | Payer: Self-pay | Source: Home / Self Care | Attending: Otolaryngology

## 2019-11-12 ENCOUNTER — Encounter (HOSPITAL_COMMUNITY): Payer: Self-pay | Admitting: Otolaryngology

## 2019-11-12 ENCOUNTER — Ambulatory Visit (HOSPITAL_COMMUNITY)
Admission: RE | Admit: 2019-11-12 | Discharge: 2019-11-12 | Disposition: A | Discharge status: Home / Self Care | Payer: Medicare HMO | Attending: Otolaryngology | Admitting: Otolaryngology

## 2019-11-12 ENCOUNTER — Ambulatory Visit (HOSPITAL_COMMUNITY): Payer: Medicare HMO

## 2019-11-12 ENCOUNTER — Other Ambulatory Visit: Payer: Self-pay

## 2019-11-12 DIAGNOSIS — M1990 Unspecified osteoarthritis, unspecified site: Secondary | ICD-10-CM | POA: Insufficient documentation

## 2019-11-12 DIAGNOSIS — Z791 Long term (current) use of non-steroidal anti-inflammatories (NSAID): Secondary | ICD-10-CM | POA: Diagnosis not present

## 2019-11-12 DIAGNOSIS — Z87891 Personal history of nicotine dependence: Secondary | ICD-10-CM | POA: Diagnosis not present

## 2019-11-12 DIAGNOSIS — I10 Essential (primary) hypertension: Secondary | ICD-10-CM | POA: Diagnosis not present

## 2019-11-12 DIAGNOSIS — Z8572 Personal history of non-Hodgkin lymphomas: Secondary | ICD-10-CM | POA: Insufficient documentation

## 2019-11-12 DIAGNOSIS — Z7982 Long term (current) use of aspirin: Secondary | ICD-10-CM | POA: Diagnosis not present

## 2019-11-12 DIAGNOSIS — Z923 Personal history of irradiation: Secondary | ICD-10-CM | POA: Insufficient documentation

## 2019-11-12 DIAGNOSIS — Z86018 Personal history of other benign neoplasm: Secondary | ICD-10-CM

## 2019-11-12 DIAGNOSIS — Z79899 Other long term (current) drug therapy: Secondary | ICD-10-CM | POA: Insufficient documentation

## 2019-11-12 DIAGNOSIS — D351 Benign neoplasm of parathyroid gland: Secondary | ICD-10-CM | POA: Diagnosis not present

## 2019-11-12 DIAGNOSIS — E213 Hyperparathyroidism, unspecified: Secondary | ICD-10-CM | POA: Diagnosis present

## 2019-11-12 DIAGNOSIS — Z8639 Personal history of other endocrine, nutritional and metabolic disease: Secondary | ICD-10-CM

## 2019-11-12 DIAGNOSIS — Z8551 Personal history of malignant neoplasm of bladder: Secondary | ICD-10-CM | POA: Diagnosis not present

## 2019-11-12 HISTORY — DX: Personal history of other benign neoplasm: Z86.018

## 2019-11-12 HISTORY — PX: PARATHYROIDECTOMY: SHX19

## 2019-11-12 HISTORY — DX: Personal history of other endocrine, nutritional and metabolic disease: Z86.39

## 2019-11-12 SURGERY — PARATHYROIDECTOMY
Anesthesia: General | Site: Neck

## 2019-11-12 MED ORDER — DEXAMETHASONE SODIUM PHOSPHATE 10 MG/ML IJ SOLN
INTRAMUSCULAR | Status: DC | PRN
  Administered 2019-11-12: 10 mg via INTRAVENOUS

## 2019-11-12 MED ORDER — 0.9 % SODIUM CHLORIDE (POUR BTL) OPTIME
TOPICAL | Status: DC | PRN
  Administered 2019-11-12: 1000 mL

## 2019-11-12 MED ORDER — ONDANSETRON HCL 4 MG/2ML IJ SOLN: 4.0000 mg | Freq: Once | INTRAMUSCULAR | Status: DC | PRN

## 2019-11-12 MED ORDER — FENTANYL CITRATE (PF) 250 MCG/5ML IJ SOLN
INTRAMUSCULAR | Status: DC | PRN
  Administered 2019-11-12: 50 ug via INTRAVENOUS
  Administered 2019-11-12: 100 ug via INTRAVENOUS
  Administered 2019-11-12: 50 ug via INTRAVENOUS

## 2019-11-12 MED ORDER — PROPOFOL 10 MG/ML IV BOLUS
INTRAVENOUS | Status: DC | PRN
  Administered 2019-11-12: 180 mg via INTRAVENOUS
  Administered 2019-11-12: 20 mg via INTRAVENOUS

## 2019-11-12 MED ORDER — ONDANSETRON HCL 4 MG/2ML IJ SOLN
INTRAMUSCULAR | Status: DC | PRN
  Administered 2019-11-12: 4 mg via INTRAVENOUS

## 2019-11-12 MED ORDER — CHLORHEXIDINE GLUCONATE 0.12 % MT SOLN
OROMUCOSAL | Status: AC
  Administered 2019-11-12: 15 mL via OROMUCOSAL
  Filled 2019-11-12: qty 15

## 2019-11-12 MED ORDER — FENTANYL CITRATE (PF) 100 MCG/2ML IJ SOLN
25.0000 ug | INTRAMUSCULAR | Status: DC | PRN
  Administered 2019-11-12 (×3): 50 ug via INTRAVENOUS

## 2019-11-12 MED ORDER — FENTANYL CITRATE (PF) 100 MCG/2ML IJ SOLN
INTRAMUSCULAR | Status: AC
  Filled 2019-11-12: qty 2

## 2019-11-12 MED ORDER — PROPOFOL 10 MG/ML IV BOLUS
INTRAVENOUS | Status: AC
  Filled 2019-11-12: qty 20

## 2019-11-12 MED ORDER — EPHEDRINE SULFATE-NACL 50-0.9 MG/10ML-% IV SOSY
PREFILLED_SYRINGE | INTRAVENOUS | Status: DC | PRN
  Administered 2019-11-12: 10 mg via INTRAVENOUS

## 2019-11-12 MED ORDER — LIDOCAINE-EPINEPHRINE 1 %-1:100000 IJ SOLN
INTRAMUSCULAR | Status: AC
  Filled 2019-11-12: qty 1

## 2019-11-12 MED ORDER — SUGAMMADEX SODIUM 200 MG/2ML IV SOLN
INTRAVENOUS | Status: DC | PRN
  Administered 2019-11-12: 200 mg via INTRAVENOUS

## 2019-11-12 MED ORDER — LIDOCAINE 2% (20 MG/ML) 5 ML SYRINGE
INTRAMUSCULAR | Status: DC | PRN
  Administered 2019-11-12 (×2): 40 mg via INTRAVENOUS

## 2019-11-12 MED ORDER — ROCURONIUM BROMIDE 10 MG/ML (PF) SYRINGE
PREFILLED_SYRINGE | INTRAVENOUS | Status: DC | PRN
  Administered 2019-11-12: 60 mg via INTRAVENOUS
  Administered 2019-11-12: 10 mg via INTRAVENOUS

## 2019-11-12 MED ORDER — FENTANYL CITRATE (PF) 250 MCG/5ML IJ SOLN
INTRAMUSCULAR | Status: AC
  Filled 2019-11-12: qty 5

## 2019-11-12 MED ORDER — PROMETHAZINE HCL 25 MG RE SUPP
25.0000 mg | Freq: Four times a day (QID) | RECTAL | 1 refills | Status: DC | PRN
Start: 2019-11-12 — End: 2020-09-23

## 2019-11-12 MED ORDER — HYDROCODONE-ACETAMINOPHEN 7.5-325 MG PO TABS: 1.0000 | ORAL_TABLET | Freq: Four times a day (QID) | ORAL | 0 refills | Status: DC | PRN

## 2019-11-12 MED ORDER — STERILE WATER FOR IRRIGATION IR SOLN
Status: DC | PRN
  Administered 2019-11-12: 1000 mL

## 2019-11-12 MED ORDER — LACTATED RINGERS IV SOLN: INTRAVENOUS | Status: DC

## 2019-11-12 MED ORDER — OXYCODONE HCL 5 MG/5ML PO SOLN: 5.0000 mg | Freq: Once | ORAL | Status: DC | PRN

## 2019-11-12 MED ORDER — CHLORHEXIDINE GLUCONATE 0.12 % MT SOLN: 15.0000 mL | Freq: Once | OROMUCOSAL | Status: AC

## 2019-11-12 MED ORDER — OXYCODONE HCL 5 MG PO TABS: 5.0000 mg | ORAL_TABLET | Freq: Once | ORAL | Status: DC | PRN

## 2019-11-12 MED ORDER — ORAL CARE MOUTH RINSE: 15.0000 mL | Freq: Once | OROMUCOSAL | Status: AC

## 2019-11-12 SURGICAL SUPPLY — 41 items
ADH SKN CLS APL DERMABOND .7 (GAUZE/BANDAGES/DRESSINGS) ×1
BLADE SURG 15 STRL LF DISP TIS (BLADE) IMPLANT
BLADE SURG 15 STRL SS (BLADE)
CANISTER SUCT 3000ML PPV (MISCELLANEOUS) ×3 IMPLANT
CLEANER TIP ELECTROSURG 2X2 (MISCELLANEOUS) ×3 IMPLANT
CNTNR URN SCR LID CUP LEK RST (MISCELLANEOUS) ×1 IMPLANT
CONT SPEC 4OZ STRL OR WHT (MISCELLANEOUS) ×3
CORD BIPOLAR FORCEPS 12FT (ELECTRODE) ×3 IMPLANT
COVER SURGICAL LIGHT HANDLE (MISCELLANEOUS) ×3 IMPLANT
COVER WAND RF STERILE (DRAPES) ×3 IMPLANT
DERMABOND ADVANCED (GAUZE/BANDAGES/DRESSINGS) ×2
DERMABOND ADVANCED .7 DNX12 (GAUZE/BANDAGES/DRESSINGS) ×1 IMPLANT
DRAIN HEMOVAC 7FR (DRAIN) IMPLANT
DRAIN SNY 10 ROU (WOUND CARE) IMPLANT
DRAPE HALF SHEET 40X57 (DRAPES) IMPLANT
ELECT COATED BLADE 2.86 ST (ELECTRODE) ×3 IMPLANT
ELECT REM PT RETURN 9FT ADLT (ELECTROSURGICAL) ×3
ELECTRODE REM PT RTRN 9FT ADLT (ELECTROSURGICAL) ×1 IMPLANT
EVACUATOR SILICONE 100CC (DRAIN) ×3 IMPLANT
FORCEPS BIPOLAR SPETZLER 8 1.0 (NEUROSURGERY SUPPLIES) ×3 IMPLANT
GAUZE 4X4 16PLY RFD (DISPOSABLE) IMPLANT
GLOVE BIO SURGEON STRL SZ 6.5 (GLOVE) ×2 IMPLANT
GLOVE BIO SURGEONS STRL SZ 6.5 (GLOVE) ×1
GLOVE ECLIPSE 7.5 STRL STRAW (GLOVE) ×3 IMPLANT
GOWN STRL REUS W/ TWL LRG LVL3 (GOWN DISPOSABLE) ×2 IMPLANT
GOWN STRL REUS W/TWL LRG LVL3 (GOWN DISPOSABLE) ×6
KIT BASIN OR (CUSTOM PROCEDURE TRAY) ×3 IMPLANT
KIT TURNOVER KIT B (KITS) ×3 IMPLANT
NDL PRECISIONGLIDE 27X1.5 (NEEDLE) ×1 IMPLANT
NEEDLE PRECISIONGLIDE 27X1.5 (NEEDLE) ×3 IMPLANT
NS IRRIG 1000ML POUR BTL (IV SOLUTION) ×3 IMPLANT
PAD ARMBOARD 7.5X6 YLW CONV (MISCELLANEOUS) ×6 IMPLANT
PENCIL FOOT CONTROL (ELECTRODE) ×3 IMPLANT
SHEARS HARMONIC 9CM CVD (BLADE) ×3 IMPLANT
STAPLER VISISTAT 35W (STAPLE) ×1 IMPLANT
SUT CHROMIC 4 0 PS 2 18 (SUTURE) ×6 IMPLANT
SUT ETHILON 3 0 PS 1 (SUTURE) ×3 IMPLANT
SUT SILK 3 0 REEL (SUTURE) IMPLANT
SUT SILK 4 0 REEL (SUTURE) ×3 IMPLANT
TOWEL GREEN STERILE FF (TOWEL DISPOSABLE) ×3 IMPLANT
TRAY ENT MC OR (CUSTOM PROCEDURE TRAY) ×3 IMPLANT

## 2019-11-12 NOTE — Anesthesia Postprocedure Evaluation (Signed)
Anesthesia Post Note  Patient: Justin Huber  Procedure(s) Performed: PARATHYROIDECTOMY (N/A Neck)     Patient location during evaluation: PACU Anesthesia Type: General Level of consciousness: awake and alert Pain management: pain level controlled Vital Signs Assessment: post-procedure vital signs reviewed and stable Respiratory status: spontaneous breathing, nonlabored ventilation, respiratory function stable and patient connected to nasal cannula oxygen Cardiovascular status: blood pressure returned to baseline and stable Postop Assessment: no apparent nausea or vomiting Anesthetic complications: no   No complications documented.  Last Vitals:  Vitals:   11/12/19 1044 11/12/19 1059  BP: (!) 142/77 (!) 145/76  Pulse: 68 66  Resp: 14 16  Temp:  36.7 C  SpO2: 94% 92%    Last Pain:  Vitals:   11/12/19 1100  TempSrc:   PainSc: 5                  Woodie Degraffenreid COKER

## 2019-11-12 NOTE — Interval H&P Note (Signed)
History and Physical Interval Note:  11/12/2019 8:14 AM  [name]  has presented today for surgery, with the diagnosis of Right parotid  mass.  The various methods of treatment have been discussed with the patient and family. After consideration of risks, benefits and other options for treatment, the patient has consented to  Procedure(s): PARATHYROIDECTOMY (Right) as a surgical intervention.  The patient's history has been reviewed, patient examined, no change in status, stable for surgery.  I have reviewed the patient's chart and labs.  Questions were answered to the patient's satisfaction.     Justin Huber

## 2019-11-12 NOTE — Transfer of Care (Signed)
Immediate Anesthesia Transfer of Care Note  Patient: Justin Huber  Procedure(s) Performed: PARATHYROIDECTOMY (N/A Neck)  Patient Location: PACU  Anesthesia Type:General  Level of Consciousness: awake, alert  and patient cooperative  Airway & Oxygen Therapy: Patient Spontanous Breathing  Post-op Assessment: Report given to RN and Post -op Vital signs reviewed and stable  Post vital signs: Reviewed and stable  Last Vitals:  Vitals Value Taken Time  BP 147/73 11/12/19 0959  Temp    Pulse 73 11/12/19 0959  Resp 15 11/12/19 0959  SpO2 94 % 11/12/19 0959  Vitals shown include unvalidated device data.  Last Pain:  Vitals:   11/12/19 0702  TempSrc:   PainSc: 2       Patients Stated Pain Goal: 3 (24/46/28 6381)  Complications: No complications documented.

## 2019-11-12 NOTE — Anesthesia Preprocedure Evaluation (Signed)
Anesthesia Evaluation  Patient identified by MRN, date of birth, ID band Patient awake    Reviewed: Allergy & Precautions, NPO status , Patient's Chart, lab work & pertinent test results  Airway Mallampati: II  TM Distance: >3 FB Neck ROM: Full    Dental  (+) Teeth Intact, Dental Advisory Given   Pulmonary former smoker,    breath sounds clear to auscultation       Cardiovascular hypertension,  Rhythm:Regular Rate:Normal     Neuro/Psych    GI/Hepatic   Endo/Other    Renal/GU      Musculoskeletal   Abdominal   Peds  Hematology   Anesthesia Other Findings   Reproductive/Obstetrics                             Anesthesia Physical Anesthesia Plan  ASA: II  Anesthesia Plan: General   Post-op Pain Management:    Induction: Intravenous  PONV Risk Score and Plan: Ondansetron and Dexamethasone  Airway Management Planned: Oral ETT  Additional Equipment:   Intra-op Plan:   Post-operative Plan: Extubation in OR  Informed Consent: I have reviewed the patients History and Physical, chart, labs and discussed the procedure including the risks, benefits and alternatives for the proposed anesthesia with the patient or authorized representative who has indicated his/her understanding and acceptance.     Dental advisory given  Plan Discussed with: CRNA and Anesthesiologist  Anesthesia Plan Comments:         Anesthesia Quick Evaluation

## 2019-11-12 NOTE — Anesthesia Procedure Notes (Signed)
Procedure Name: Intubation Date/Time: 11/12/2019 8:37 AM Performed by: Janace Litten, CRNA Pre-anesthesia Checklist: Patient identified, Emergency Drugs available, Suction available and Patient being monitored Patient Re-evaluated:Patient Re-evaluated prior to induction Oxygen Delivery Method: Circle System Utilized Preoxygenation: Pre-oxygenation with 100% oxygen Induction Type: IV induction Ventilation: Mask ventilation without difficulty Laryngoscope Size: Mac and 4 Grade View: Grade I Tube type: Oral Tube size: 7.5 mm Number of attempts: 1 Airway Equipment and Method: Stylet Placement Confirmation: ETT inserted through vocal cords under direct vision,  positive ETCO2 and breath sounds checked- equal and bilateral Secured at: 22 cm Tube secured with: Tape Dental Injury: Teeth and Oropharynx as per pre-operative assessment

## 2019-11-12 NOTE — Discharge Instructions (Signed)
You may shower and use soap and water. Do not use any creams, oils or ointment. ° °

## 2019-11-12 NOTE — Op Note (Signed)
OPERATIVE REPORT  DATE OF SURGERY: 11/12/2019  PATIENT:  Justin Huber,  69 y.o. male  PRE-OPERATIVE DIAGNOSIS: Hyperparathyroidism  POST-OPERATIVE DIAGNOSIS: Hyperparathyroidism  PROCEDURE:  Procedure(s): PARATHYROIDECTOMY  SURGEON:  Beckie Salts, MD  ASSISTANTS: Jolene Provost PA  ANESTHESIA:   General   EBL: 20 ml  DRAINS: None  LOCAL MEDICATIONS USED:  None  SPECIMEN: Suspected right inferior parathyroid, frozen section positive for hypercellular parathyroid tissue  COUNTS:  Correct  PROCEDURE DETAILS: The patient was taken to the operating room and placed on the operating table in the supine position. Following induction of general endotracheal anesthesia, the neck was prepped and draped in a standard fashion.  A transverse incision was outlined with a marking pen about a finger breaths above the clavicle.  Electrocautery was used to incise the skin subcutaneous tissue and platysma layer.  Self-retaining retractor was used for the remainder of the case.  The midline fascia was divided and the thyroid isthmus was exposed as the strap muscles were reflected off of the right side.  As the gland was pulled forward the inferior aspect was examined and a elongated Grady ovoid suspected parathyroid gland was identified.  It measured proximately 1.5 cm in greatest dimension.  This was dissected free of surrounding tissue.  The harmonic dissector was used.  The specimen was sent for frozen section analysis.  Based on the results of the procedure was concluded.  Valsalva maneuver was used to assure that there is no abnormal bleeding.  The wound was closed in layers using 4-0 chromic interrupted on the strap muscles at the midline and on the platysmal layer.  A running subcuticular closure was then used also with 4-0 chromic.  Dermabond was used on the skin.  The patient was awakened extubated and transferred to recovery in stable condition.  The surgical assistant was necessary in this  case to assist with suctioning, identifying bleeders, holding retractors.    PATIENT DISPOSITION:  To PACU, stable

## 2019-11-13 ENCOUNTER — Encounter (HOSPITAL_COMMUNITY): Payer: Self-pay | Admitting: Otolaryngology

## 2019-11-13 LAB — SURGICAL PATHOLOGY

## 2019-12-10 ENCOUNTER — Encounter (HOSPITAL_BASED_OUTPATIENT_CLINIC_OR_DEPARTMENT_OTHER): Payer: Self-pay | Admitting: Urology

## 2019-12-10 ENCOUNTER — Other Ambulatory Visit: Payer: Self-pay

## 2019-12-10 NOTE — Progress Notes (Addendum)
ADDENDUM:  Chart reviewed by anesthesia, Konrad Felix PA, looks good.   Spoke w/ via phone for pre-op interview--- PT Lab needs dos---- Istat              Lab results------ current ekg in epic/ chart COVID test ------ 12-12-2019 @ 1030 Arrive at ------- 0530 NPO after MN  Medications to take morning of surgery ----- Allegra, Flonase spray Diabetic medication ----- n/a Patient Special Instructions ----- n/a Pre-Op special Istructions ----- n/a Patient verbalized understanding of instructions that were given at this phone interview. Patient denies shortness of breath, chest pain, fever, cough at this phone interview.   Anesthesia Review:  Pt had recent surgery 11-12-2019 right inferior parathyroidectomy @ [facility].  Pt stated incision is not red , no swelling, and no difficulty swallowing (just some surgical glue still present).  Chart reviewed by Konrad Felix PA.  PCP: Dr D. Bouska (lov 12-03-2019 epic)  EKG : 11-15-2019 epic Blood Thinner/ Instructions Maryjane Hurter Dose: NO ASA / Instructions/ Last Dose : ASA 81mg / pt stated given instructions to stop prior to surgery, last dose 12-05-2019.

## 2019-12-12 ENCOUNTER — Other Ambulatory Visit (HOSPITAL_COMMUNITY)
Admission: RE | Admit: 2019-12-12 | Discharge: 2019-12-12 | Disposition: A | Discharge status: Home / Self Care | Payer: Medicare HMO | Source: Ambulatory Visit | Attending: Urology | Admitting: Urology

## 2019-12-12 DIAGNOSIS — Z20822 Contact with and (suspected) exposure to covid-19: Secondary | ICD-10-CM | POA: Insufficient documentation

## 2019-12-12 DIAGNOSIS — Z01812 Encounter for preprocedural laboratory examination: Secondary | ICD-10-CM | POA: Diagnosis present

## 2019-12-12 LAB — SARS CORONAVIRUS 2 (TAT 6-24 HRS): SARS Coronavirus 2: NEGATIVE

## 2019-12-15 NOTE — Anesthesia Preprocedure Evaluation (Addendum)
Anesthesia Evaluation  Patient identified by MRN, date of birth, ID band Patient awake    Reviewed: Allergy & Precautions, NPO status , Patient's Chart, lab work & pertinent test results  History of Anesthesia Complications Negative for: history of anesthetic complications  Airway Mallampati: II  TM Distance: >3 FB Neck ROM: Full    Dental  (+) Teeth Intact, Dental Advisory Given   Pulmonary former smoker,    breath sounds clear to auscultation       Cardiovascular hypertension, Normal cardiovascular exam     Neuro/Psych negative neurological ROS     GI/Hepatic negative GI ROS, Neg liver ROS,   Endo/Other  negative endocrine ROS  Renal/GU      Musculoskeletal   Abdominal   Peds  Hematology   Anesthesia Other Findings   Reproductive/Obstetrics                            Anesthesia Physical  Anesthesia Plan  ASA: II  Anesthesia Plan: General   Post-op Pain Management:    Induction: Intravenous  PONV Risk Score and Plan: 2 and Ondansetron and Dexamethasone  Airway Management Planned: LMA  Additional Equipment:   Intra-op Plan:   Post-operative Plan: Extubation in OR  Informed Consent: I have reviewed the patients History and Physical, chart, labs and discussed the procedure including the risks, benefits and alternatives for the proposed anesthesia with the patient or authorized representative who has indicated his/her understanding and acceptance.     Dental advisory given  Plan Discussed with: Anesthesiologist and CRNA  Anesthesia Plan Comments:        Anesthesia Quick Evaluation

## 2019-12-16 ENCOUNTER — Encounter (HOSPITAL_BASED_OUTPATIENT_CLINIC_OR_DEPARTMENT_OTHER)
Admission: RE | Disposition: A | Discharge status: Home / Self Care | Payer: Self-pay | Source: Home / Self Care | Attending: Urology

## 2019-12-16 ENCOUNTER — Ambulatory Visit (HOSPITAL_BASED_OUTPATIENT_CLINIC_OR_DEPARTMENT_OTHER): Payer: Medicare HMO | Admitting: Physician Assistant

## 2019-12-16 ENCOUNTER — Ambulatory Visit (HOSPITAL_BASED_OUTPATIENT_CLINIC_OR_DEPARTMENT_OTHER)
Admission: RE | Admit: 2019-12-16 | Discharge: 2019-12-16 | Disposition: A | Discharge status: Home / Self Care | Payer: Medicare HMO | Attending: Urology | Admitting: Urology

## 2019-12-16 ENCOUNTER — Other Ambulatory Visit: Payer: Self-pay

## 2019-12-16 ENCOUNTER — Encounter (HOSPITAL_BASED_OUTPATIENT_CLINIC_OR_DEPARTMENT_OTHER): Payer: Self-pay | Admitting: Urology

## 2019-12-16 DIAGNOSIS — E785 Hyperlipidemia, unspecified: Secondary | ICD-10-CM | POA: Insufficient documentation

## 2019-12-16 DIAGNOSIS — Z923 Personal history of irradiation: Secondary | ICD-10-CM | POA: Diagnosis not present

## 2019-12-16 DIAGNOSIS — N3090 Cystitis, unspecified without hematuria: Secondary | ICD-10-CM | POA: Insufficient documentation

## 2019-12-16 DIAGNOSIS — Z8551 Personal history of malignant neoplasm of bladder: Secondary | ICD-10-CM | POA: Diagnosis not present

## 2019-12-16 DIAGNOSIS — I10 Essential (primary) hypertension: Secondary | ICD-10-CM | POA: Insufficient documentation

## 2019-12-16 DIAGNOSIS — N401 Enlarged prostate with lower urinary tract symptoms: Secondary | ICD-10-CM | POA: Insufficient documentation

## 2019-12-16 DIAGNOSIS — N200 Calculus of kidney: Secondary | ICD-10-CM | POA: Diagnosis not present

## 2019-12-16 DIAGNOSIS — Z79899 Other long term (current) drug therapy: Secondary | ICD-10-CM | POA: Diagnosis not present

## 2019-12-16 DIAGNOSIS — Z7982 Long term (current) use of aspirin: Secondary | ICD-10-CM | POA: Insufficient documentation

## 2019-12-16 DIAGNOSIS — Z87891 Personal history of nicotine dependence: Secondary | ICD-10-CM | POA: Diagnosis not present

## 2019-12-16 DIAGNOSIS — N323 Diverticulum of bladder: Secondary | ICD-10-CM | POA: Insufficient documentation

## 2019-12-16 DIAGNOSIS — R3914 Feeling of incomplete bladder emptying: Secondary | ICD-10-CM | POA: Diagnosis not present

## 2019-12-16 DIAGNOSIS — Z8572 Personal history of non-Hodgkin lymphomas: Secondary | ICD-10-CM | POA: Insufficient documentation

## 2019-12-16 DIAGNOSIS — Z87442 Personal history of urinary calculi: Secondary | ICD-10-CM | POA: Diagnosis not present

## 2019-12-16 HISTORY — PX: CYSTOSCOPY/URETEROSCOPY/HOLMIUM LASER/STENT PLACEMENT: SHX6546

## 2019-12-16 HISTORY — DX: Personal history of other endocrine, nutritional and metabolic disease: Z86.39

## 2019-12-16 LAB — POCT I-STAT, CHEM 8
BUN: 17 mg/dL (ref 8–23)
Calcium, Ion: 1.28 mmol/L (ref 1.15–1.40)
Chloride: 108 mmol/L (ref 98–111)
Creatinine, Ser: 0.6 mg/dL — ABNORMAL LOW (ref 0.61–1.24)
Glucose, Bld: 108 mg/dL — ABNORMAL HIGH (ref 70–99)
HCT: 37 % — ABNORMAL LOW (ref 39.0–52.0)
Hemoglobin: 12.6 g/dL — ABNORMAL LOW (ref 13.0–17.0)
Potassium: 3.5 mmol/L (ref 3.5–5.1)
Sodium: 143 mmol/L (ref 135–145)
TCO2: 21 mmol/L — ABNORMAL LOW (ref 22–32)

## 2019-12-16 SURGERY — CYSTOSCOPY/URETEROSCOPY/HOLMIUM LASER/STENT PLACEMENT
Anesthesia: General | Laterality: Right

## 2019-12-16 MED ORDER — FENTANYL CITRATE (PF) 100 MCG/2ML IJ SOLN
INTRAMUSCULAR | Status: DC | PRN
Start: 2019-12-16 — End: 2019-12-16
  Administered 2019-12-16 (×2): 50 ug via INTRAVENOUS

## 2019-12-16 MED ORDER — PROPOFOL 10 MG/ML IV BOLUS
INTRAVENOUS | Status: DC | PRN
  Administered 2019-12-16: 180 mg via INTRAVENOUS

## 2019-12-16 MED ORDER — CELECOXIB 200 MG PO CAPS
200.0000 mg | ORAL_CAPSULE | Freq: Once | ORAL | Status: AC
  Administered 2019-12-16: 200 mg via ORAL

## 2019-12-16 MED ORDER — EPHEDRINE 5 MG/ML INJ
INTRAVENOUS | Status: AC
  Filled 2019-12-16: qty 10

## 2019-12-16 MED ORDER — EPHEDRINE SULFATE-NACL 50-0.9 MG/10ML-% IV SOSY
PREFILLED_SYRINGE | INTRAVENOUS | Status: DC | PRN
  Administered 2019-12-16: 10 mg via INTRAVENOUS

## 2019-12-16 MED ORDER — LIDOCAINE 2% (20 MG/ML) 5 ML SYRINGE
INTRAMUSCULAR | Status: DC | PRN
  Administered 2019-12-16: 80 mg via INTRAVENOUS

## 2019-12-16 MED ORDER — ACETAMINOPHEN 500 MG PO TABS
ORAL_TABLET | ORAL | Status: AC
  Filled 2019-12-16: qty 2

## 2019-12-16 MED ORDER — ONDANSETRON HCL 4 MG/2ML IJ SOLN
INTRAMUSCULAR | Status: AC
  Filled 2019-12-16: qty 2

## 2019-12-16 MED ORDER — LACTATED RINGERS IV SOLN: INTRAVENOUS | Status: DC

## 2019-12-16 MED ORDER — DEXAMETHASONE SODIUM PHOSPHATE 10 MG/ML IJ SOLN
INTRAMUSCULAR | Status: DC | PRN
  Administered 2019-12-16: 10 mg via INTRAVENOUS

## 2019-12-16 MED ORDER — CEFAZOLIN SODIUM-DEXTROSE 2-4 GM/100ML-% IV SOLN
INTRAVENOUS | Status: AC
  Filled 2019-12-16: qty 100

## 2019-12-16 MED ORDER — SODIUM CHLORIDE 0.9 % IR SOLN
Status: DC | PRN
  Administered 2019-12-16: 3000 mL via INTRAVESICAL

## 2019-12-16 MED ORDER — ACETAMINOPHEN 500 MG PO TABS
1000.0000 mg | ORAL_TABLET | Freq: Once | ORAL | Status: AC
  Administered 2019-12-16: 1000 mg via ORAL

## 2019-12-16 MED ORDER — CELECOXIB 200 MG PO CAPS
ORAL_CAPSULE | ORAL | Status: AC
  Filled 2019-12-16: qty 1

## 2019-12-16 MED ORDER — CEFAZOLIN SODIUM-DEXTROSE 2-4 GM/100ML-% IV SOLN: 2.0000 g | Freq: Once | INTRAVENOUS | Status: DC

## 2019-12-16 MED ORDER — IOHEXOL 300 MG/ML  SOLN
INTRAMUSCULAR | Status: DC | PRN
  Administered 2019-12-16: 7 mL via URETHRAL

## 2019-12-16 MED ORDER — FENTANYL CITRATE (PF) 100 MCG/2ML IJ SOLN
25.0000 ug | INTRAMUSCULAR | Status: DC | PRN
  Administered 2019-12-16: 50 ug via INTRAVENOUS

## 2019-12-16 MED ORDER — FENTANYL CITRATE (PF) 100 MCG/2ML IJ SOLN
INTRAMUSCULAR | Status: AC
  Filled 2019-12-16: qty 2

## 2019-12-16 MED ORDER — PROPOFOL 10 MG/ML IV BOLUS
INTRAVENOUS | Status: AC
  Filled 2019-12-16: qty 20

## 2019-12-16 MED ORDER — DEXAMETHASONE SODIUM PHOSPHATE 10 MG/ML IJ SOLN
INTRAMUSCULAR | Status: AC
  Filled 2019-12-16: qty 1

## 2019-12-16 MED ORDER — ONDANSETRON HCL 4 MG/2ML IJ SOLN
INTRAMUSCULAR | Status: DC | PRN
  Administered 2019-12-16: 4 mg via INTRAVENOUS

## 2019-12-16 MED ORDER — LIDOCAINE 2% (20 MG/ML) 5 ML SYRINGE
INTRAMUSCULAR | Status: AC
  Filled 2019-12-16: qty 5

## 2019-12-16 MED ORDER — PROMETHAZINE HCL 25 MG/ML IJ SOLN: 6.2500 mg | INTRAMUSCULAR | Status: DC | PRN

## 2019-12-16 SURGICAL SUPPLY — 32 items
BAG DRAIN URO-CYSTO SKYTR STRL (DRAIN) ×3 IMPLANT
BAG DRN RND TRDRP ANRFLXCHMBR (UROLOGICAL SUPPLIES) ×1
BAG DRN UROCATH (DRAIN) ×1
BAG URINE DRAIN 2000ML AR STRL (UROLOGICAL SUPPLIES) ×2 IMPLANT
BASKET ZERO TIP NITINOL 2.4FR (BASKET) ×2 IMPLANT
BSKT STON RTRVL ZERO TP 2.4FR (BASKET) ×1
CATH FOLEY 2WAY SLVR  5CC 16FR (CATHETERS) ×3
CATH FOLEY 2WAY SLVR 5CC 16FR (CATHETERS) IMPLANT
CATH URET 5FR 28IN CONE TIP (BALLOONS)
CATH URET 5FR 28IN OPEN ENDED (CATHETERS) IMPLANT
CATH URET 5FR 70CM CONE TIP (BALLOONS) IMPLANT
CATH URET DUAL LUMEN 6-10FR 50 (CATHETERS) IMPLANT
CLOTH BEACON ORANGE TIMEOUT ST (SAFETY) ×3 IMPLANT
FIBER LASER TRAC TIP (UROLOGICAL SUPPLIES) ×2 IMPLANT
GLOVE BIO SURGEON STRL SZ7.5 (GLOVE) ×3 IMPLANT
GLOVE BIO SURGEON STRL SZ8 (GLOVE) IMPLANT
GOWN STRL REUS W/TWL LRG LVL3 (GOWN DISPOSABLE) ×3 IMPLANT
GUIDEWIRE ANG ZIPWIRE 038X150 (WIRE) IMPLANT
GUIDEWIRE STR DUAL SENSOR (WIRE) ×5 IMPLANT
GUIDEWIRE ZIPWRE .038 STRAIGHT (WIRE) IMPLANT
HOLDER FOLEY CATH W/STRAP (MISCELLANEOUS) ×3 IMPLANT
IV NS IRRIG 3000ML ARTHROMATIC (IV SOLUTION) ×6 IMPLANT
KIT TURNOVER CYSTO (KITS) ×3 IMPLANT
MANIFOLD NEPTUNE II (INSTRUMENTS) ×3 IMPLANT
NS IRRIG 500ML POUR BTL (IV SOLUTION) ×3 IMPLANT
PACK CYSTO (CUSTOM PROCEDURE TRAY) ×3 IMPLANT
SHEATH URETERAL 12FRX35CM (MISCELLANEOUS) ×2 IMPLANT
STENT URET 6FRX26 CONTOUR (STENTS) ×3 IMPLANT
SYR CONTROL 10ML LL (SYRINGE) ×2 IMPLANT
TUBE CONNECTING 12'X1/4 (SUCTIONS) ×1
TUBE CONNECTING 12X1/4 (SUCTIONS) ×2 IMPLANT
TUBING UROLOGY SET (TUBING) ×3 IMPLANT

## 2019-12-16 NOTE — Anesthesia Procedure Notes (Signed)
Procedure Name: Intubation Date/Time: 12/16/2019 7:53 AM Performed by: Evon Dejarnett D, CRNA Pre-anesthesia Checklist: Patient identified, Emergency Drugs available, Suction available and Patient being monitored Patient Re-evaluated:Patient Re-evaluated prior to induction Oxygen Delivery Method: Circle system utilized Preoxygenation: Pre-oxygenation with 100% oxygen Induction Type: IV induction Ventilation: Mask ventilation without difficulty LMA: LMA inserted LMA Size: 4.0 Tube type: Oral Number of attempts: 1 Placement Confirmation: positive ETCO2 and breath sounds checked- equal and bilateral Tube secured with: Tape Dental Injury: Teeth and Oropharynx as per pre-operative assessment

## 2019-12-16 NOTE — H&P (Signed)
H&P  Chief Complaint: right renal stones, incomplete bladder emptying   History of Present Illness:   Justin Huber is a 69 year old male with a history of bladder cancer, BPH and incomplete bladder emptying.  He also has a history of kidney stones.  On office KUB and ultrasound it appeared he had an 18 mm right renal pelvic stone and a 50 mm right lower pole stone.  The stones have slowly increased over time.  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  He is brought today for cystoscopy, bladder biopsy, right retrograde pyelogram right ureteroscopy laser lithotripsy and stent placement.  He has been well.  His urine culture in the office grew strep viridans and have had him on cephalexin.  He has been well without fever or dysuria.  No gross hematuria.  He had parathyroid surgery last month and is healing well from that.      Past Medical History:  Diagnosis Date  . Frequency of urination   . 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 History of bladder cancer urologist-  dr Junious Silk   s/p  TURBT  and removal bladder diverticulum  . History of hypercalcemia   . History of kidney stones   . History of non-Hodgkin's lymphoma    1980--  RADIATION THERAPY /  NO CHEMO---  NO RECURRENCE  . Hyperlipidemia   . Hypertension   . Incomplete emptying of bladder   . Lumbar spinal stenosis    L4 -- L5--  LEG PAIN--- TREATMENT EPI INJECITON'S  . Nephrolithiasis    right  . Nocturia more than twice per night   . Sigmoid diverticulosis    Past Surgical History:  Procedure Laterality Date  . BLADDER DIVERTICULECTOMY  2012   [facility]  . CYSTO/  ATTEMPTED RESECTION BLADDER TUMOR  01-13-2011  . CYSTO/  RIGHT URETEROSCOPIC LASER LITHOTRIPSY STONE EXTRACTION  07-19-2006  . CYSTOSCOPY WITH BIOPSY N/A 03/09/2015   Procedure: CYSTOSCOPY WITH BLADDER BIOPSY AND FULGRATION;  Surgeon: Festus Aloe, MD;   Location: [facility];  Service: Urology;  Laterality: N/A;  . CYSTOSCOPY WITH FULGERATION N/A 07/13/2017   Procedure: Blue light CYSTOSCOPY with cysview WITH FULGERATION/ BLADDER BIOPSY;  Surgeon: Festus Aloe, MD;  Location: [facility];  Service: Urology;  Laterality: N/A;  . CYSTOSCOPY WITH RETROGRADE PYELOGRAM, URETEROSCOPY AND STENT PLACEMENT Right 11/03/2013   Procedure: CYSTOSCOPY WITH RIGHT RETROGRADE PYELOGRAM, RIGHT URETEROSCOPY AND STENT PLACEMENT, Litolipaxy;  Surgeon: Festus Aloe, MD;  Location: [facility];  Service: Urology;  Laterality: Right;  . CYSTOSCOPY WITH URETEROSCOPY AND STENT PLACEMENT Right 12/05/2013   Procedure: RIGHT  URETEROSCOPY RIGHT RETROGRADE AND  RIGHT STENT PLACEMENT;  Surgeon: Festus Aloe, MD;  Location: [facility];  Service: Urology;  Laterality: Right;  . CYSTOSCOPY WITH URETEROSCOPY AND STENT PLACEMENT Right 12/30/2013   Procedure: RIGHT  URETEROSCOPY AND STENT REMOVAL AND PLACEMENT;  Surgeon: Festus Aloe, MD;  Location: [facility];  Service: Urology;  Laterality: Right;  . EXTRACORPOREAL SHOCK WAVE LITHOTRIPSY  10-24-2012  &  2007  . HOLMIUM LASER APPLICATION N/A [id number]/[id number]   Procedure: HOLMIUM LASER ;  Surgeon: Festus Aloe, MD;  Location: [facility];  Service: Urology;  Laterality: N/A;  . NASAL SEPTUM SURGERY  yrs ago  . PARATHYROIDECTOMY N/A 11/12/2019   Procedure: PARATHYROIDECTOMY;  Surgeon: Izora Gala, MD;  Location: [facility];  Service: ENT;  Laterality: N/A;  .  TRANSURETHRAL RESECTION OF BLADDER TUMOR  09/ 2011  (in [place])   from bladder diverticulum  . TRANSURETHRAL RESECTION OF BLADDER TUMOR N/A 09/21/2015   Procedure: TRANSURETHRAL RESECTION OF BLADDER TUMOR (TURBT) less than 2 cm ;  Surgeon: Festus Aloe, MD;  Location: [facility];  Service: Urology;  Laterality: N/A;    Home Medications:  Medications Prior to Admission  Medication Sig Dispense Refill  Last Dose  . acetaminophen (TYLENOL) 650 MG CR tablet Take 650 mg by mouth every 8 (eight) hours as needed for pain.   12/15/2019 at Unknown time  . Apoaequorin (PREVAGEN PO) Take by mouth daily.   Past Week at Unknown time  . aspirin EC 81 MG tablet Take 81 mg by mouth every evening.   12/05/2019 at Unknown time  . b complex vitamins capsule Take 1 capsule by mouth daily.   Past Month at Unknown time  . Cholecalciferol (VITAMIN D3) 2000 units TABS Take 2,000 Units by mouth daily.    Past Month at Unknown time  . fexofenadine (ALLEGRA) 180 MG tablet Take 180 mg by mouth daily.   12/15/2019 at Unknown time  . fluticasone (FLONASE) 50 MCG/ACT nasal spray Place 1 spray into both nostrils daily.   12/15/2019 at Unknown time  . hydrochlorothiazide (HYDRODIURIL) 25 MG tablet Take 25 mg by mouth every morning.    Past Week at Unknown time  . irbesartan (AVAPRO) 300 MG tablet Take 300 mg by mouth every morning.    12/15/2019 at Unknown time  . meloxicam (MOBIC) 7.5 MG tablet Take 7.5 mg by mouth 2 (two) times daily as needed for pain.    12/05/2019 at Unknown time  . pravastatin (PRAVACHOL) 20 MG tablet Take 20 mg by mouth every evening.    Past Week at Unknown time  . HYDROcodone-acetaminophen (NORCO) 7.5-325 MG tablet Take 1 tablet by mouth every 6 (six) hours as needed for moderate pain. (Patient not taking: Reported on 12/10/2019) 20 tablet 0 Unknown at Unknown time  . promethazine (PHENERGAN) 25 MG suppository Place 1 suppository (25 mg total) rectally every 6 (six) hours as needed for nausea or vomiting. (Patient not taking: Reported on 12/10/2019) 12 suppository 1 Unknown at Unknown time   Allergies: No Known Allergies  Family History  Problem Relation Age of Onset  . Dementia Father    Social History:  reports that he quit smoking about 26 years ago. He has a 13.00 pack-year smoking history. He has never used smokeless tobacco. He reports current alcohol use. He reports that he does not use  drugs.  ROS: A complete review of systems was performed.  All systems are negative except for pertinent findings as noted. ROS   Physical Exam:  Vital signs in last 24 hours: Temp:  [98.9 F (37.2 C)] 98.9 F (37.2 C) (09/14 0541) Pulse Rate:  [67] 67 (09/14 0541) Resp:  [18] 18 (09/14 0541) BP: (156)/(83) 156/83 (09/14 0541) SpO2:  [96 %] 96 % (09/14 0541) Weight:  [100 kg] 100 kg (09/14 0541) General:  Alert and oriented, No acute distress HEENT: Normocephalic, atraumatic Cardiovascular: Regular rate and rhythm Lungs: Regular rate and effort Abdomen: Soft, nontender, nondistended, no abdominal masses Back: No CVA tenderness Extremities: No edema Neurologic: Grossly intact  Laboratory Data:  Results for orders placed or performed during the hospital encounter of 12/16/19 (from the past 24 hour(s))  I-STAT, chem 8     Status: Abnormal   Collection Time: 12/16/19  6:53 AM  Result Value  Ref Range   Sodium 143 135 - 145 mmol/L   Potassium 3.5 3.5 - 5.1 mmol/L   Chloride 108 98 - 111 mmol/L   BUN 17 8 - 23 mg/dL   Creatinine, Ser 0.60 (L) 0.61 - 1.24 mg/dL   Glucose, Bld 108 (H) 70 - 99 mg/dL   Calcium, Ion 1.28 1.15 - 1.40 mmol/L   TCO2 21 (L) 22 - 32 mmol/L   Hemoglobin 12.6 (L) 13.0 - 17.0 g/dL   HCT 37.0 (L) 39 - 52 %   Recent Results (from the past 240 hour(s))  SARS CORONAVIRUS 2 (TAT 6-24 HRS) Nasopharyngeal Nasopharyngeal Swab     Status: None   Collection Time: 12/12/19  2:21 PM   Specimen: Nasopharyngeal Swab  Result Value Ref Range Status   SARS Coronavirus 2 NEGATIVE NEGATIVE Final    Comment: (NOTE) SARS-CoV-2 target nucleic acids are NOT DETECTED.  The SARS-CoV-2 RNA is generally detectable in upper and lower respiratory specimens during the acute phase of infection. Negative results do not preclude SARS-CoV-2 infection, do not rule out co-infections with other pathogens, and should not be used as the sole basis for treatment or other patient  management decisions. Negative results must be combined with clinical observations, patient history, and epidemiological information. The expected result is Negative.  Fact Sheet for Patients: SugarRoll.be  Fact Sheet for Healthcare Providers: https://www.woods-mathews.com/  This test is not yet approved or cleared by the [place] FDA and  has been authorized for detection and/or diagnosis of SARS-CoV-2 by FDA under an Emergency Use Authorization (EUA). This EUA will remain  in effect (meaning this test can be used) for the duration of the COVID-19 declaration under Se ction 564(b)(1) of the Act, 21 U.S.C. section 360bbb-3(b)(1), unless the authorization is terminated or revoked sooner.  Performed at [facility], [place] [address], [place], [place] [address]    Creatinine: Recent Labs    12/16/19 5537  CREATININE 0.60*    Impression/Assessment:  BPH, incomplete bladder emptying, bladder erythema, right renal stones-  Plan:  I discussed with the patient the nature, potential benefits, risks and alternatives to cystoscopy with bladder biopsy, right retrograde pyelogram, right ureteroscopy with laser lithotripsy and right ureteral stent placement, including side effects of the proposed treatment, the likelihood of the patient achieving the goals of the procedure, and any potential problems that might occur during the procedure or recuperation.  We discussed he may need a staged procedure given the stone burden.  All questions answered. Patient elects to proceed.    Festus Aloe 12/16/2019, 7:30 AM

## 2019-12-16 NOTE — Transfer of Care (Signed)
Immediate Anesthesia Transfer of Care Note  Patient: Justin Huber  Procedure(s) Performed: CYSTOSCOPY/RETROGRADE/URETEROSCOPY/HOLMIUM LASER/STENT PLACEMENT, BLADDER BX'S (Right )  Patient Location: PACU  Anesthesia Type:General  Level of Consciousness: awake, alert  and oriented  Airway & Oxygen Therapy: Patient Spontanous Breathing and Patient connected to nasal cannula oxygen  Post-op Assessment: Report given to RN and Post -op Vital signs reviewed and stable  Post vital signs: Reviewed and stable  Last Vitals:  Vitals Value Taken Time  BP    Temp    Pulse    Resp    SpO2      Last Pain:  Vitals:   12/16/19 0541  TempSrc: Oral         Complications: No complications documented.

## 2019-12-16 NOTE — Anesthesia Postprocedure Evaluation (Signed)
Anesthesia Post Note  Patient: Michelangelo B Marlin  Procedure(s) Performed: CYSTOSCOPY/RETROGRADE/URETEROSCOPY/HOLMIUM LASER/STENT PLACEMENT, BLADDER BX'S (Right )     Patient location during evaluation: PACU Anesthesia Type: General Level of consciousness: sedated Pain management: pain level controlled Vital Signs Assessment: post-procedure vital signs reviewed and stable Respiratory status: spontaneous breathing and respiratory function stable Cardiovascular status: stable Postop Assessment: no apparent nausea or vomiting Anesthetic complications: no   No complications documented.  Last Vitals:  Vitals:   12/16/19 1000 12/16/19 1023  BP: (!) 150/81 (!) 162/85  Pulse: 61 64  Resp: 13 16  Temp: (!) 36.4 C   SpO2: 100% 97%    Last Pain:  Vitals:   12/16/19 1030  TempSrc:   PainSc: 0-No pain                 Verneice Caspers DANIEL

## 2019-12-16 NOTE — Op Note (Signed)
Preoperative diagnosis: Bladder erythema, bladder diverticulum, right renal stones Postoperative diagnosis: Same  Procedure: Cystoscopy with bladder biopsy and fulguration up to 0.5 cm; Right retrograde pyelogram; Right ureteroscopy laser lithotripsy and right ureteral stent placement  Surgeon: Junious Silk  Anesthesia: General  Indication for procedure: Mr. Sahr is a 69 year old male.  He has a history of BPH, bladder diverticulum, incomplete bladder emptying and bladder CIS.  He has some erythema on routine cystoscopy and right renal pelvic and right lower pole stones.  He is brought for above procedures.  Findings: On exam under anesthesia the penis was circumcised and without mass or lesion.  Testicles descended bilaterally and palpably normal.  On digital rectal exam the prostate was smooth without hard area or nodule.  Prostate about 30 g.  On cystoscopy the urethra was unremarkable, prostatic urethra with mild hyperplasia no visual obstruction.  Large capacity bladder.  Large right bladder diverticulum.  Some scattered erythema and edema of the bladder but overall looks better than his bladder is looked in the past.  No papillary mucosal lesions.  No stone or foreign body in the bladder.  Trigone and ureteral orifice ease in their normal orthotopic position with clear reflux.  Right retrograde pyelogram-this outlined a single ureter single collecting system unit with a filling defect in the renal pelvis and the lower pole consistent with the stones.  There was minimal dilation.  Right ureteroscopy revealed the right renal pelvic stone.  It was completely fragmented.  1/3-1/2 of the right lower pole stone was fragmented but the remainder was just out of reach.  Description of procedure: After consent was obtained patient brought to the operating room.  After adequate anesthesia was placed lithotomy position and prepped and draped in the usual sterile fashion.  A timeout was performed to  confirm the patient and procedure.  The cystoscope was passed per urethra and the bladder carefully inspected with a 30 degree and 70 degree lens.  I then took the cold cup biopsy forceps and biopsied some edema and erythema of the right bladder wall, right bladder diverticulum and left bladder wall.  These areas were fulgurated with the Bugbee electrode.  There was no perforation.  Hemostasis was excellent.  Then turned my attention and cannulated the right ureteral orifice with a 5 [place] open-ended catheter and retrograde injection of contrast was performed.  A sensor wire was then advanced and coiled in the collecting system and access sheath was then used to place 2 sensor wires.  The access sheath went very easily with minimal resistance.  I then placed the access sheath and left one of the wires as a safety and passed the dual-lumen digital ureteroscope.  The renal pelvic stone was dusted at setting of 0.5 and 50 and 1 and 20.  The largest fragment was then pushed up into the upper pole for ease of fragmentation where it was finished off.  There was no visual or fluoroscopic significant fragments noted.  I then was able to start on the right lower pole stone but it was just out of reach.  About half that stone remains but it has been relatively stable over the years.  And then back down the access sheath on the ureteroscope.  The collecting system renal pelvis and ureter were all inspected on the way out and again apart from the right lower pole stone noted to have no significant stone fragments and no injury.  The wire was then backloaded on the cystoscope and a 626 cm stent  advanced.  The wire was removed with a good coil seen in the kidney and a good coil in the bladder.  I went adjacent to the stent string and check the biopsy sites.  Hemostasis was excellent.  The scope was backed out.  A 16 French Foley was placed in left to gravity drainage.  The stent string was taped to the Foley catheter.  He was  awakened and taken recovery room in stable condition.  Complications: None  Blood loss: Minimal  Specimens: None  Drains: 6 x 26 cm right ureteral stent with string taped to a 16 [place] Foley catheter-I will have the patient remove the catheter in 3 days which will remove the stent.  Disposition: Patient stable to PACU

## 2019-12-16 NOTE — Discharge Instructions (Signed)
Indwelling Urinary Catheter Care, Adult An indwelling urinary catheter is a thin tube that is put into your bladder. The tube helps to drain pee (urine) out of your body. The tube goes in through your urethra. Your urethra is where pee comes out of your body. Your pee will come out through the catheter, then it will go into a bag (drainage bag). Take good care of your catheter so it will work well.  Removal of the Foley and right ureteral stent: Remove the Foley catheter on Friday morning, December 19, 2019.  The string for the stent is taped to the Foley catheter and the stent will slide out with the Foley.  It is a small blue floppy tube.  How to wear your catheter and bag Supplies needed  Sticky tape (adhesive tape) or a leg strap.  Alcohol wipe or soap and water (if you use tape).  A clean towel (if you use tape).  Large overnight bag.  Smaller bag (leg bag). Wearing your catheter Attach your catheter to your leg with tape or a leg strap.  Make sure the catheter is not pulled tight.  If a leg strap gets wet, take it off and put on a dry strap.  If you use tape to hold the bag on your leg: 1. Use an alcohol wipe or soap and water to wash your skin where the tape made it sticky before. 2. Use a clean towel to pat-dry that skin. 3. Use new tape to make the bag stay on your leg. Wearing your bags You should have been given a large overnight bag.  You may wear the overnight bag in the day or night.  Always have the overnight bag lower than your bladder.  Do not let the bag touch the floor.  Before you go to sleep, put a clean plastic bag in a wastebasket. Then hang the overnight bag inside the wastebasket. You should also have a smaller leg bag that fits under your clothes.  Always wear the leg bag below your knee.  Do not wear your leg bag at night. How to care for your skin and catheter Supplies needed  A clean washcloth.  Water and mild soap.  A clean  towel. Caring for your skin and catheter      Clean the skin around your catheter every day: 1. Wash your hands with soap and water. 2. Wet a clean washcloth in warm water and mild soap. 3. Clean the skin around your urethra.  If you are male:  Gently spread the folds of skin around your vagina (labia).  With the washcloth in your other hand, wipe the inner side of your labia on each side. Wipe from front to back.  If you are male:  Pull back any skin that covers the end of your penis (foreskin).  With the washcloth in your other hand, wipe your penis in small circles. Start wiping at the tip of your penis, then move away from the catheter.  Move the foreskin back in place, if needed. 4. With your free hand, hold the catheter close to where it goes into your body.  Keep holding the catheter during cleaning so it does not get pulled out. 5. With the washcloth in your other hand, clean the catheter.  Only wipe downward on the catheter.  Do not wipe upward toward your body. Doing this may push germs into your urethra and cause infection. 6. Use a clean towel to pat-dry the catheter and the  skin around it. Make sure to wipe off all soap. 7. Wash your hands with soap and water.  Shower every day. Do not take baths.  Do not use cream, ointment, or lotion on the area where the catheter goes into your body, unless your doctor tells you to.  Do not use powders, sprays, or lotions on your genital area.  Check your skin around the catheter every day for signs of infection. Check for: ? Redness, swelling, or pain. ? Fluid or blood. ? Warmth. ? Pus or a bad smell. How to empty the bag Supplies needed  Rubbing alcohol.  Gauze pad or cotton ball.  Tape or a leg strap. Emptying the bag Pour the pee out of your bag when it is ?- full, or at least 2-3 times a day. Do this for your overnight bag and your leg bag. 1. Wash your hands with soap and water. 2. Separate (detach)  the bag from your leg. 3. Hold the bag over the toilet or a clean pail. Keep the bag lower than your hips and bladder. This is so the pee (urine) does not go back into the tube. 4. Open the pour spout. It is at the bottom of the bag. 5. Empty the pee into the toilet or pail. Do not let the pour spout touch any surface. 6. Put rubbing alcohol on a gauze pad or cotton ball. 7. Use the gauze pad or cotton ball to clean the pour spout. 8. Close the pour spout. 9. Attach the bag to your leg with tape or a leg strap. 10. Wash your hands with soap and water. Follow instructions for cleaning the drainage bag:  From the product maker.  As told by your doctor. How to change the bag Supplies needed  Alcohol wipes.  A clean bag.  Tape or a leg strap. Changing the bag Replace your bag when it starts to leak, smell bad, or look dirty. 1. Wash your hands with soap and water. 2. Separate the dirty bag from your leg. 3. Pinch the catheter with your fingers so that pee does not spill out. 4. Separate the catheter tube from the bag tube where these tubes connect (at the connection valve). Do not let the tubes touch any surface. 5. Clean the end of the catheter tube with an alcohol wipe. Use a different alcohol wipe to clean the end of the bag tube. 6. Connect the catheter tube to the tube of the clean bag. 7. Attach the clean bag to your leg with tape or a leg strap. Do not make the bag tight on your leg. 8. Wash your hands with soap and water. General rules   Never pull on your catheter. Never try to take it out. Doing that can hurt you.  Always wash your hands before and after you touch your catheter or bag. Use a mild, fragrance-free soap. If you do not have soap and water, use hand sanitizer.  Always make sure there are no twists or bends (kinks) in the catheter tube.  Always make sure there are no leaks in the catheter or bag.  Drink enough fluid to keep your pee pale yellow.  Do not  take baths, swim, or use a hot tub.  If you are male, wipe from front to back after you poop (have a bowel movement). Contact a doctor if:  Your pee is cloudy.  Your pee smells worse than usual.  Your catheter gets clogged.  Your catheter leaks.  Your  bladder feels full. Get help right away if:  You have redness, swelling, or pain where the catheter goes into your body.  You have fluid, blood, pus, or a bad smell coming from the area where the catheter goes into your body.  Your skin feels warm where the catheter goes into your body.  You have a fever.  You have pain in your: ? Belly (abdomen). ? Legs. ? Lower back. ? Bladder.  You see blood in the catheter.  Your pee is pink or red.  You feel sick to your stomach (nauseous).  You throw up (vomit).  You have chills.  Your pee is not draining into the bag.  Your catheter gets pulled out. Summary  An indwelling urinary catheter is a thin tube that is placed into the bladder to help drain pee (urine) out of the body.  The catheter is placed into the part of the body that drains pee from the bladder (urethra).  Taking good care of your catheter will keep it working properly and help prevent problems.  Always wash your hands before and after touching your catheter or bag.  Never pull on your catheter or try to take it out. This information is not intended to replace advice given to you by your health care provider. Make sure you discuss any questions you have with your health care provider. Document Revised: 07/12/2018 Document Reviewed: 11/03/2016 Elsevier Patient Education  [place].

## 2019-12-17 ENCOUNTER — Encounter (HOSPITAL_BASED_OUTPATIENT_CLINIC_OR_DEPARTMENT_OTHER): Payer: Self-pay | Admitting: Urology

## 2019-12-17 LAB — SURGICAL PATHOLOGY

## 2020-06-02 ENCOUNTER — Ambulatory Visit: Payer: Medicare HMO | Admitting: Physician Assistant

## 2020-06-02 ENCOUNTER — Other Ambulatory Visit: Payer: Self-pay

## 2020-06-02 ENCOUNTER — Encounter: Payer: Self-pay | Admitting: Physician Assistant

## 2020-06-02 DIAGNOSIS — Z1283 Encounter for screening for malignant neoplasm of skin: Secondary | ICD-10-CM

## 2020-06-02 DIAGNOSIS — L821 Other seborrheic keratosis: Secondary | ICD-10-CM | POA: Diagnosis not present

## 2020-06-02 DIAGNOSIS — C4442 Squamous cell carcinoma of skin of scalp and neck: Secondary | ICD-10-CM

## 2020-06-02 DIAGNOSIS — C4492 Squamous cell carcinoma of skin, unspecified: Secondary | ICD-10-CM

## 2020-06-02 DIAGNOSIS — D485 Neoplasm of uncertain behavior of skin: Secondary | ICD-10-CM

## 2020-06-02 HISTORY — DX: Squamous cell carcinoma of skin, unspecified: C44.92

## 2020-06-02 NOTE — Patient Instructions (Signed)

## 2020-06-08 ENCOUNTER — Encounter: Payer: Self-pay | Admitting: Physician Assistant

## 2020-06-17 ENCOUNTER — Encounter: Payer: Self-pay | Admitting: Physician Assistant

## 2020-06-17 NOTE — Telephone Encounter (Signed)
-----   Message from Warren Danes, [place] sent at 06/16/2020  4:21 PM EDT ----- mohs

## 2020-06-17 NOTE — Telephone Encounter (Signed)
Phone call from patient returning our call. Pathology results given to patient.  

## 2020-06-17 NOTE — Telephone Encounter (Signed)
Phone call to patient with his pathology results. Voicemail left for patient to give the office a call back.  ?

## 2020-06-21 ENCOUNTER — Encounter: Payer: Self-pay | Admitting: Physician Assistant

## 2020-06-21 NOTE — Progress Notes (Signed)
   Follow-Up Visit   Subjective  Justin Huber is a 70 y.o. male who presents for the following: Annual Exam (No new concerns).   The following portions of the chart were reviewed this encounter and updated as appropriate:  Tobacco  Allergies  Meds  Problems  Med Hx  Surg Hx  Fam Hx      Objective  Well appearing patient in no apparent distress; mood and affect are within normal limits.  All skin waist up examined.  Objective  Waist to Head: Waist up exam today no signs of atypical moles, or melanoma.  Once possible non mole skin cancer on patient's scalp that will be biopsied today.  Objective  Left Mid Parietal Scalp: Crust on pink base        Objective  Mid Parietal Scalp: Stuck-on, waxy papules and plaques.    Assessment & Plan  Skin exam for malignant neoplasm Waist to Head  Yearly skin check.  Neoplasm of uncertain behavior of skin Left Mid Parietal Scalp  Skin / nail biopsy Type of biopsy: tangential   Informed consent: discussed and consent obtained   Timeout: patient name, date of birth, surgical site, and procedure verified   Procedure prep:  Patient was prepped and draped in usual sterile fashion (Non sterile) Prep type:  Chlorhexidine Anesthesia: the lesion was anesthetized in a standard fashion   Anesthetic:  1% lidocaine w/ epinephrine 1-100,000 local infiltration Instrument used: flexible razor blade   Outcome: patient tolerated procedure well   Post-procedure details: wound care instructions given    Specimen 1 - Surgical pathology Differential Diagnosis: R/O BCC vs SCC  Check Margins: Yes  Seborrheic keratosis Mid Parietal Scalp  observe    I, Wauneta Silveria, PA-C, have reviewed all documentation's for this visit.  The documentation on 06/21/20 for the exam, diagnosis, procedures and orders are all accurate and complete.

## 2020-09-20 ENCOUNTER — Other Ambulatory Visit: Payer: Self-pay | Admitting: Urology

## 2020-09-20 DIAGNOSIS — N200 Calculus of kidney: Secondary | ICD-10-CM

## 2020-09-20 NOTE — Progress Notes (Signed)
Talked with patient. Instructions given arrival time 0530. Meds and hx reviewed. Driver is wife.

## 2020-09-23 ENCOUNTER — Encounter (HOSPITAL_BASED_OUTPATIENT_CLINIC_OR_DEPARTMENT_OTHER): Payer: Self-pay | Admitting: Urology

## 2020-09-23 ENCOUNTER — Other Ambulatory Visit: Payer: Self-pay

## 2020-09-23 ENCOUNTER — Ambulatory Visit (HOSPITAL_COMMUNITY): Payer: Medicare HMO

## 2020-09-23 ENCOUNTER — Encounter (HOSPITAL_BASED_OUTPATIENT_CLINIC_OR_DEPARTMENT_OTHER)
Admission: RE | Disposition: A | Discharge status: Home / Self Care | Payer: Self-pay | Source: Home / Self Care | Attending: Urology

## 2020-09-23 ENCOUNTER — Ambulatory Visit (HOSPITAL_BASED_OUTPATIENT_CLINIC_OR_DEPARTMENT_OTHER)
Admission: RE | Admit: 2020-09-23 | Discharge: 2020-09-23 | Disposition: A | Discharge status: Home / Self Care | Payer: Medicare HMO | Attending: Urology | Admitting: Urology

## 2020-09-23 DIAGNOSIS — N200 Calculus of kidney: Secondary | ICD-10-CM | POA: Insufficient documentation

## 2020-09-23 DIAGNOSIS — Z7982 Long term (current) use of aspirin: Secondary | ICD-10-CM | POA: Diagnosis not present

## 2020-09-23 DIAGNOSIS — Z923 Personal history of irradiation: Secondary | ICD-10-CM | POA: Insufficient documentation

## 2020-09-23 DIAGNOSIS — Z87442 Personal history of urinary calculi: Secondary | ICD-10-CM | POA: Insufficient documentation

## 2020-09-23 DIAGNOSIS — Z87891 Personal history of nicotine dependence: Secondary | ICD-10-CM | POA: Insufficient documentation

## 2020-09-23 DIAGNOSIS — I10 Essential (primary) hypertension: Secondary | ICD-10-CM | POA: Diagnosis not present

## 2020-09-23 DIAGNOSIS — Z79899 Other long term (current) drug therapy: Secondary | ICD-10-CM | POA: Diagnosis not present

## 2020-09-23 DIAGNOSIS — Z85828 Personal history of other malignant neoplasm of skin: Secondary | ICD-10-CM | POA: Diagnosis not present

## 2020-09-23 DIAGNOSIS — Z791 Long term (current) use of non-steroidal anti-inflammatories (NSAID): Secondary | ICD-10-CM | POA: Diagnosis not present

## 2020-09-23 DIAGNOSIS — Z8572 Personal history of non-Hodgkin lymphomas: Secondary | ICD-10-CM | POA: Diagnosis not present

## 2020-09-23 DIAGNOSIS — Z8551 Personal history of malignant neoplasm of bladder: Secondary | ICD-10-CM | POA: Insufficient documentation

## 2020-09-23 HISTORY — PX: EXTRACORPOREAL SHOCK WAVE LITHOTRIPSY: SHX1557

## 2020-09-23 SURGERY — LITHOTRIPSY, ESWL
Anesthesia: LOCAL | Laterality: Right

## 2020-09-23 MED ORDER — SENNOSIDES-DOCUSATE SODIUM 8.6-50 MG PO TABS: ORAL_TABLET | ORAL | 0 refills | Status: AC

## 2020-09-23 MED ORDER — DIAZEPAM 5 MG PO TABS
10.0000 mg | ORAL_TABLET | Freq: Once | ORAL | Status: AC
  Administered 2020-09-23: 10 mg via ORAL

## 2020-09-23 MED ORDER — CIPROFLOXACIN HCL 500 MG PO TABS
ORAL_TABLET | ORAL | Status: AC
  Filled 2020-09-23: qty 1

## 2020-09-23 MED ORDER — SODIUM CHLORIDE 0.9 % IV SOLN: INTRAVENOUS | Status: DC

## 2020-09-23 MED ORDER — DIPHENHYDRAMINE HCL 25 MG PO CAPS
25.0000 mg | ORAL_CAPSULE | Freq: Once | ORAL | Status: AC
  Administered 2020-09-23: 25 mg via ORAL

## 2020-09-23 MED ORDER — CIPROFLOXACIN HCL 500 MG PO TABS
500.0000 mg | ORAL_TABLET | Freq: Once | ORAL | Status: AC
  Administered 2020-09-23: 500 mg via ORAL

## 2020-09-23 MED ORDER — DIPHENHYDRAMINE HCL 25 MG PO CAPS
ORAL_CAPSULE | ORAL | Status: AC
  Filled 2020-09-23: qty 1

## 2020-09-23 MED ORDER — ONDANSETRON 8 MG PO TBDP: 8.0000 mg | ORAL_TABLET | Freq: Three times a day (TID) | ORAL | 0 refills | Status: AC | PRN

## 2020-09-23 MED ORDER — OXYCODONE-ACETAMINOPHEN 5-325 MG PO TABS: 1.0000 | ORAL_TABLET | Freq: Four times a day (QID) | ORAL | 0 refills | Status: AC | PRN

## 2020-09-23 MED ORDER — DIAZEPAM 5 MG PO TABS
ORAL_TABLET | ORAL | Status: AC
  Filled 2020-09-23: qty 2

## 2020-09-23 NOTE — Brief Op Note (Signed)
09/23/2020  8:32 AM  PATIENT:  Justin Huber  70 y.o. male  PRE-OPERATIVE DIAGNOSIS:  RIGHT RENAL STONE  POST-OPERATIVE DIAGNOSIS:  * No post-op diagnosis entered *  PROCEDURE:  Procedure(s): EXTRACORPOREAL SHOCK WAVE LITHOTRIPSY (ESWL) (Right)  SURGEON:  Surgeon(s) and Role:    * Alexis Frock, MD - Primary  PHYSICIAN ASSISTANT:   ASSISTANTS: none   ANESTHESIA:   MAC  EBL:  none   BLOOD ADMINISTERED:none  DRAINS: none   LOCAL MEDICATIONS USED:  NONE  SPECIMEN:  No Specimen  DISPOSITION OF SPECIMEN:  N/A  COUNTS:  YES  TOURNIQUET:  * No tourniquets in log *  DICTATION: .Note written in paper chart  PLAN OF CARE: Discharge to home after PACU  PATIENT DISPOSITION:  Short Stay   Delay start of Pharmacological VTE agent (>24hrs) due to surgical blood loss or risk of bleeding: not applicable

## 2020-09-23 NOTE — H&P (Signed)
Justin Huber is an 70 y.o. male.    Chief Complaint: Pre-OP RIGHT Shockwave LIthotripsy   HPI:   1 - Recurrent Urolithiasis -  Pre 2022 - SWL and stage ureteroscopy  09/2020 - RLP 26mm stone on surveillance.  Today "Justin Huber" is seen to proceed with RIGHT SWL for growing stone to help avoid progression to acute colic or need for repeat ureteroscopy . NO interval fevers. Most recent UCX negative.   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   s/p  right inferior parathyroidectomy,  benign adenoma   History of bladder cancer urologist-  dr Junious Silk   s/p  TURBT  and removal bladder diverticulum   History of hypercalcemia    History of kidney stones    History of non-Hodgkin's lymphoma    1980--  RADIATION THERAPY /  NO CHEMO---  NO RECURRENCE   Hyperlipidemia    Hypertension    Incomplete emptying of bladder    Lumbar spinal stenosis    L4 -- L5--  LEG PAIN--- TREATMENT EPI INJECITON'S   Nephrolithiasis    right   Nocturia more than twice per night    Sigmoid diverticulosis    Squamous cell carcinoma of skin 06/02/2020   Left Mid Parietal Scalp    Past Surgical History:  Procedure Laterality Date   BLADDER DIVERTICULECTOMY  2012   [facility]   CYSTO/  ATTEMPTED RESECTION BLADDER TUMOR  01-13-2011   CYSTO/  RIGHT URETEROSCOPIC LASER LITHOTRIPSY STONE EXTRACTION  07-19-2006   CYSTOSCOPY WITH BIOPSY N/A 03/09/2015   Procedure: CYSTOSCOPY WITH BLADDER BIOPSY AND FULGRATION;  Surgeon: Festus Aloe, MD;  Location: [facility];  Service: Urology;  Laterality: N/A;   CYSTOSCOPY WITH FULGERATION N/A 07/13/2017   Procedure: Blue light CYSTOSCOPY with cysview WITH FULGERATION/ BLADDER BIOPSY;  Surgeon: Festus Aloe, MD;  Location: [facility];  Service: Urology;  Laterality: N/A;   CYSTOSCOPY WITH RETROGRADE PYELOGRAM, URETEROSCOPY AND STENT PLACEMENT Right 11/03/2013   Procedure:  CYSTOSCOPY WITH RIGHT RETROGRADE PYELOGRAM, RIGHT URETEROSCOPY AND STENT PLACEMENT, Litolipaxy;  Surgeon: Festus Aloe, MD;  Location: [facility];  Service: Urology;  Laterality: Right;   CYSTOSCOPY WITH URETEROSCOPY AND STENT PLACEMENT Right 12/05/2013   Procedure: RIGHT  URETEROSCOPY RIGHT RETROGRADE AND  RIGHT STENT PLACEMENT;  Surgeon: Festus Aloe, MD;  Location: [facility];  Service: Urology;  Laterality: Right;   CYSTOSCOPY WITH URETEROSCOPY AND STENT PLACEMENT Right 12/30/2013   Procedure: RIGHT  URETEROSCOPY AND STENT REMOVAL AND PLACEMENT;  Surgeon: Festus Aloe, MD;  Location: [facility];  Service: Urology;  Laterality: Right;   CYSTOSCOPY/URETEROSCOPY/HOLMIUM LASER/STENT PLACEMENT Right 12/16/2019   Procedure: CYSTOSCOPY/RETROGRADE/URETEROSCOPY/HOLMIUM LASER/STENT PLACEMENT, BLADDER BX'S;  Surgeon: Festus Aloe, MD;  Location: [facility];  Service: Urology;  Laterality: Right;   EXTRACORPOREAL SHOCK WAVE LITHOTRIPSY  10-24-2012  &  2007   HOLMIUM LASER APPLICATION N/A 09/17/735   Procedure: HOLMIUM LASER ;  Surgeon: Festus Aloe, MD;  Location: [facility];  Service: Urology;  Laterality: N/A;   NASAL SEPTUM SURGERY  yrs ago   PARATHYROIDECTOMY N/A 11/12/2019   Procedure: PARATHYROIDECTOMY;  Surgeon: Izora Gala, MD;  Location: [facility];  Service: ENT;  Laterality: N/A;   TRANSURETHRAL RESECTION OF BLADDER TUMOR  09/ 2011  (in [place])   from bladder diverticulum   TRANSURETHRAL RESECTION OF BLADDER TUMOR N/A 09/21/2015   Procedure: TRANSURETHRAL RESECTION OF BLADDER TUMOR (TURBT) less  than 2 cm ;  Surgeon: Festus Aloe, MD;  Location: [facility];  Service: Urology;  Laterality: N/A;    Family History  Problem Relation Age of Onset   Dementia Father    Social History:  reports that he quit smoking about 26 years ago. His smoking use included cigarettes. He has a 13.00 pack-year smoking history. He has never used  smokeless tobacco. He reports current alcohol use. He reports that he does not use drugs.  Allergies: No Known Allergies  Medications Prior to Admission  Medication Sig Dispense Refill   acetaminophen (TYLENOL) 650 MG CR tablet Take 650 mg by mouth every 8 (eight) hours as needed for pain.     Apoaequorin (PREVAGEN PO) Take by mouth daily.     b complex vitamins capsule Take 1 capsule by mouth daily.     Cholecalciferol (VITAMIN D3) 2000 units TABS Take 2,000 Units by mouth daily.      fexofenadine (ALLEGRA) 180 MG tablet Take 180 mg by mouth daily.     fluticasone (FLONASE) 50 MCG/ACT nasal spray Place 1 spray into both nostrils daily.     hydrochlorothiazide (HYDRODIURIL) 25 MG tablet Take 25 mg by mouth every morning.      irbesartan (AVAPRO) 300 MG tablet Take 300 mg by mouth every morning.      pravastatin (PRAVACHOL) 20 MG tablet Take 20 mg by mouth every evening.      aspirin EC 81 MG tablet Take 81 mg by mouth every evening.     meloxicam (MOBIC) 7.5 MG tablet Take 7.5 mg by mouth 2 (two) times daily as needed for pain.       No results found for this or any previous visit (from the past 48 hour(s)). No results found.  Review of Systems  Constitutional:  Negative for chills and fever.  All other systems reviewed and are negative.  Blood pressure (!) 145/87, pulse 70, temperature 98.6 F (37 C), temperature source Oral, resp. rate 18, height 6' (1.829 m), weight 99.3 kg, SpO2 96 %. Physical Exam Vitals reviewed.  HENT:     Head: Normocephalic.     Nose: Nose normal.  Eyes:     Pupils: Pupils are equal, round, and reactive to light.  Cardiovascular:     Rate and Rhythm: Normal rate.     Pulses: Normal pulses.  Pulmonary:     Effort: Pulmonary effort is normal.  Abdominal:     General: Abdomen is flat.  Genitourinary:    Comments: No CVAT at present.  Musculoskeletal:     Cervical back: Normal range of motion.  Neurological:     General: No focal deficit present.      Mental Status: He is alert.     Assessment/Plan  Proceed as planned with RIGHT shockwave lithotripsy. Risks, benefits, alternatives, expected peri-treatment course discussesd previously and reiterated today.   Alexis Frock, MD 09/23/2020, 6:42 AM

## 2020-09-23 NOTE — Discharge Instructions (Signed)
1 - You may have urinary urgency (bladder spasms), pass small stone fragments, and bloody urine on / off for up to 2 weeks. This is normal. ° °2 - Call MD or go to ER for fever >102, severe pain / nausea / vomiting not relieved by medications, or acute change in medical status ° °

## 2020-09-24 ENCOUNTER — Encounter (HOSPITAL_BASED_OUTPATIENT_CLINIC_OR_DEPARTMENT_OTHER): Payer: Self-pay | Admitting: Urology

## 2021-08-11 ENCOUNTER — Encounter: Payer: Self-pay | Admitting: Physician Assistant

## 2021-08-11 ENCOUNTER — Ambulatory Visit: Payer: Medicare HMO | Admitting: Physician Assistant

## 2021-08-11 DIAGNOSIS — L820 Inflamed seborrheic keratosis: Secondary | ICD-10-CM

## 2021-08-11 DIAGNOSIS — Z85828 Personal history of other malignant neoplasm of skin: Secondary | ICD-10-CM

## 2021-08-11 DIAGNOSIS — L570 Actinic keratosis: Secondary | ICD-10-CM | POA: Diagnosis not present

## 2021-08-11 DIAGNOSIS — Z1283 Encounter for screening for malignant neoplasm of skin: Secondary | ICD-10-CM | POA: Diagnosis not present

## 2021-08-11 NOTE — Progress Notes (Signed)
? ?  Follow-Up Visit ?  ?Subjective  ?Justin Huber is a 72 y.o. male who presents for the following: Other (History of SCC - Annual skin exam today. Just wants his scalp checked. Had MOHS last year. ). ? ? ?The following portions of the chart were reviewed this encounter and updated as appropriate:  Tobacco  Allergies  Meds  Problems  Med Hx  Surg Hx  Fam Hx   ?  ? ?Objective  ?Well appearing patient in no apparent distress; mood and affect are within normal limits. ? ?All skin waist up examined. ? ?Mid Parietal Scalp ?Erythematous patches with gritty scale. ? ?Right Upper Back ?Brown crust on erythematous base ? ? ?Assessment & Plan  ?AK (actinic keratosis) ?Mid Parietal Scalp ? ?Destruction of lesion - Mid Parietal Scalp ?Complexity: simple   ?Destruction method: cryotherapy   ?Informed consent: discussed and consent obtained   ?Timeout:  patient name, date of birth, surgical site, and procedure verified ?Lesion destroyed using liquid nitrogen: Yes   ?Cryotherapy cycles:  3 ?Outcome: patient tolerated procedure well with no complications   ? ?Seborrheic keratosis, inflamed ?Right Upper Back ? ?Destruction of lesion - Right Upper Back ?Complexity: simple   ?Destruction method: cryotherapy   ?Informed consent: discussed and consent obtained   ?Timeout:  patient name, date of birth, surgical site, and procedure verified ?Lesion destroyed using liquid nitrogen: Yes   ?Cryotherapy cycles:  3 ?Outcome: patient tolerated procedure well with no complications   ? ? ? ?I, Antonious Omahoney, PA-C, have reviewed all documentation's for this visit.  The documentation on 08/11/21 for the exam, diagnosis, procedures and orders are all accurate and complete. ?

## 2021-08-26 IMAGING — DX DG ABDOMEN 1V
2 series · 2 of 2 positions shown · non-contrast
Comparison: 09/15/2020

CLINICAL DATA: Preop evaluation for upcoming lithotripsy

EXAM:
ABDOMEN - 1 VIEW

[abdomen kub (1 of 2)]
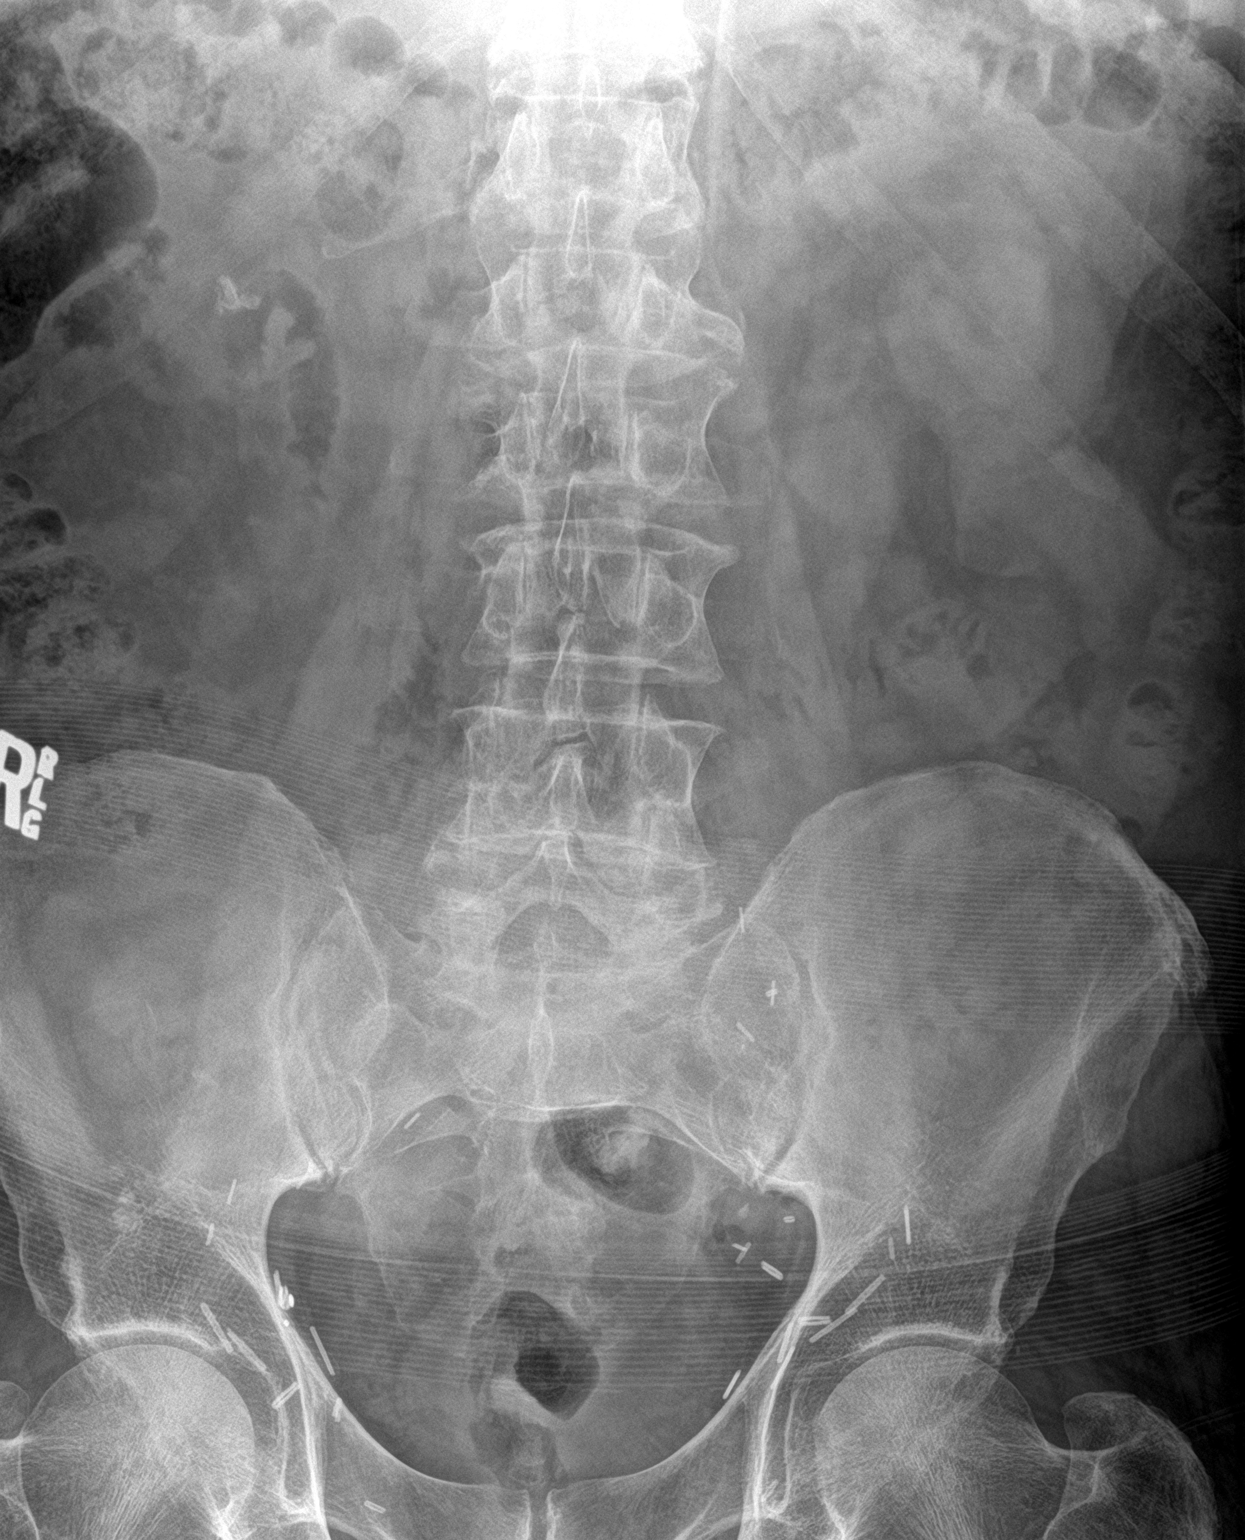

[abdomen kub (2 of 2)]
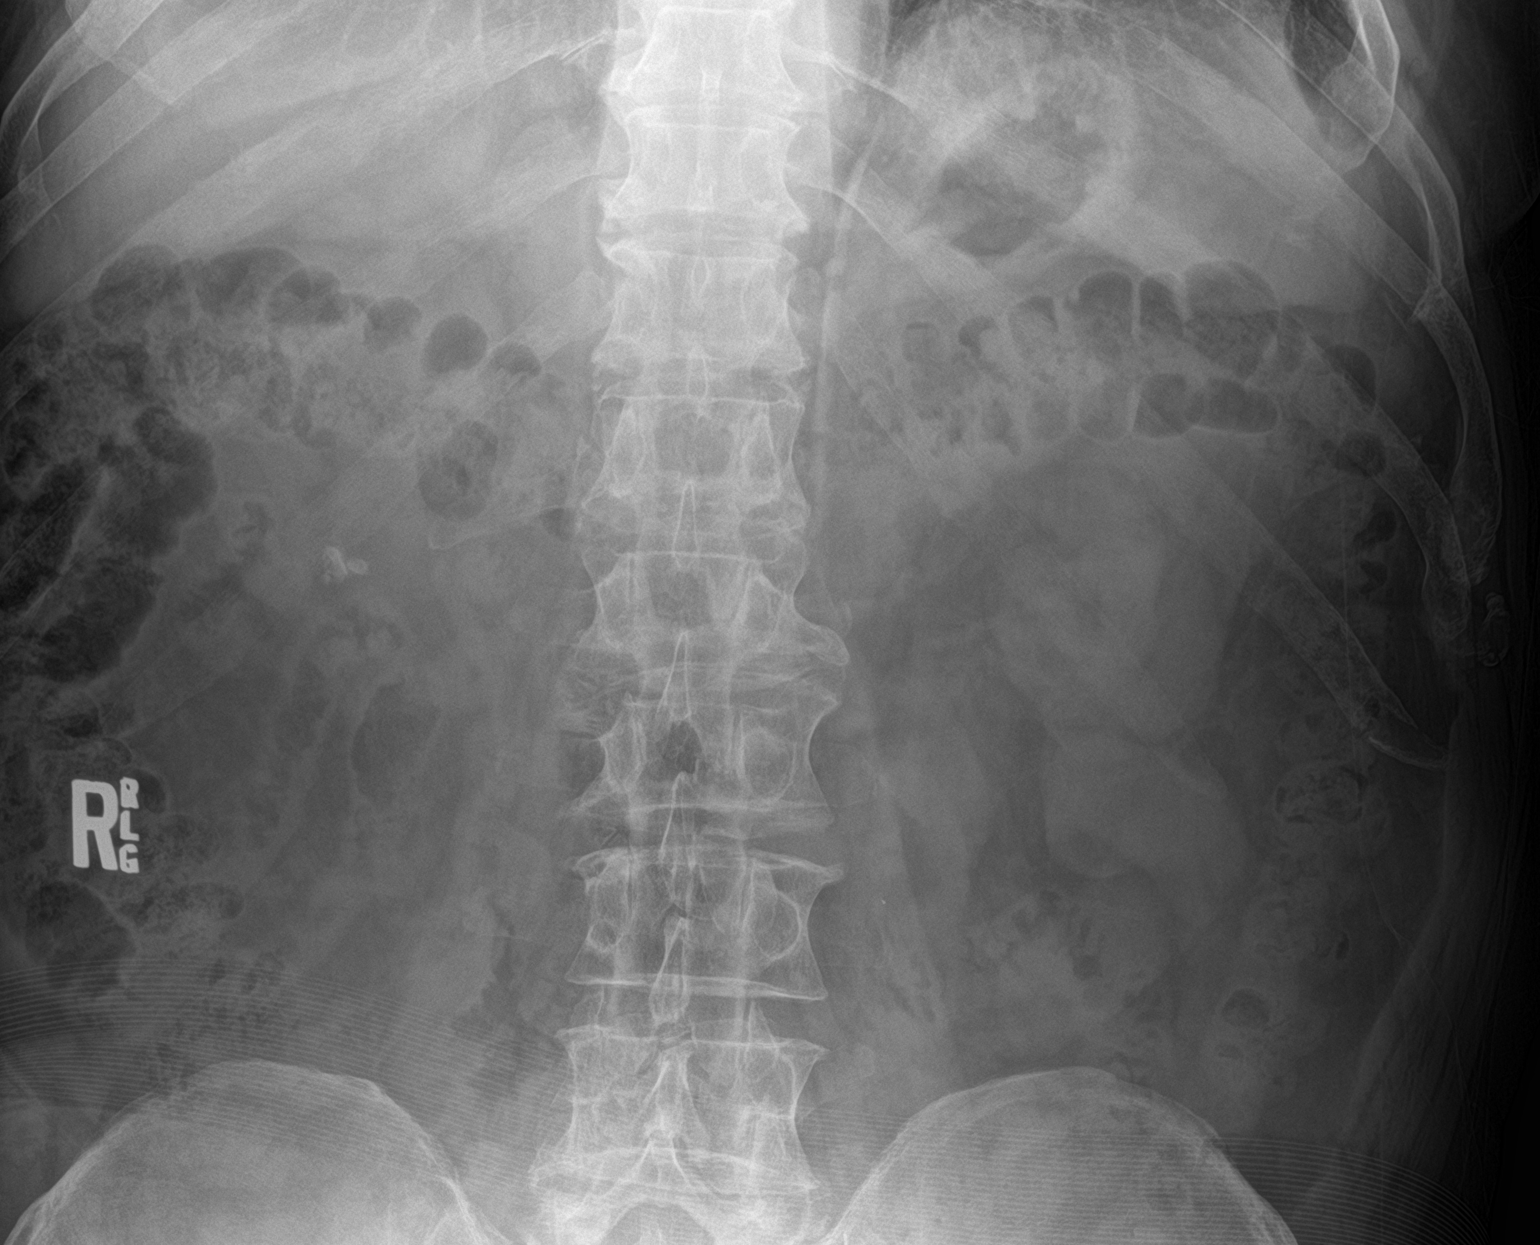

[2 of 2 positions shown; findings below may reference images not displayed]

FINDINGS: Scattered large and small bowel gas is noted. Postsurgical changes
in the pelvis are seen. Degenerative changes of lumbar spine are
noted. Multiple calculi are noted over the lower pole of the right
kidney the largest of which measures almost 10 mm. Smaller adjacent
stone measures almost 5 mm. No ureteral calculi are seen. No other
focal abnormality is noted.
IMPRESSION: Multiple stones in the lower pole of the right kidney as described.

## 2022-07-25 ENCOUNTER — Other Ambulatory Visit: Payer: Self-pay | Admitting: Urology

## 2022-08-14 ENCOUNTER — Encounter (HOSPITAL_BASED_OUTPATIENT_CLINIC_OR_DEPARTMENT_OTHER): Payer: Self-pay | Admitting: Urology

## 2022-08-14 NOTE — Progress Notes (Signed)
Spoke w/ via phone for pre-op interview---Justin Huber needs dos----  ISTAT and EKG             Huber results------ COVID test -----patient states asymptomatic no test needed Arrive at -------0530 NPO after MN NO Solid Food.   Med rec completed Medications to take morning of surgery -----NONE Diabetic medication ----- Patient instructed no nail polish to be worn day of surgery Patient instructed to bring photo id and insurance card day of surgery Patient aware to have Driver (ride ) / caregiver  Wife Justin Huber  for 24 hours after surgery  Patient Special Instructions ----- Pre-Op special Instructions ----- Patient verbalized understanding of instructions that were given at this phone interview. Patient denies shortness of breath, chest pain, fever, cough at this phone interview.

## 2022-08-16 ENCOUNTER — Ambulatory Visit: Payer: Medicare HMO | Admitting: Physician Assistant

## 2022-08-23 NOTE — H&P (Signed)
Office Visit Report     07/24/2022   --------------------------------------------------------------------------------   Justin Huber  MRN: [id number]  DOB: [birth year], 72 year old Male  SSN: [id number]   PRIMARY CARE:  Justin Huber (office closed), MD   REFERRING:  Justin Huber (office closed), MD  PROVIDER:  Jerilee Huber, M.D.  LOCATION:  [facility] [phone]     --------------------------------------------------------------------------------   CC/HPI: F/u -   1) BPH with inc bladder emptying and bladder tics - Cathed/scope PVR ~300 ml. AUASS = 10, mixed. PVR stable at about 200 ml (cathed with cystoscope). PVR 240 ml on [place]. PVR 10/21, only 156. Cysto benign and PVR ~150 ml via scope June 2022. AUASS = 3.   PVR 265 ml. He was tx with abx for proteus UTI x 2. He had some frequency. He has a new rx for bactrim. AUASS = 12.   2) PSA elevation - his 06/22 PSA 4.14. No biopsy. H/o BPH - prostate ~50 g on imaging. PSA up from 2 and 2.7, but PSA was 5.6 on 02/14/2016. PSA back down Dec 2022 PSA 2.42. PSA was 3.8 on 07/07/2021. Oct 2023 PSA 3.0.   3) kidney stones - Prior ESWL. Stones are calcium oxalate. Apr 2015 stented for fever and proximal right ureteral stone. Taken for staged right ureteroscopy. Stent removed in office. CT with large stone in right kidney May 2020 (2 cm right renal pelvis, 8 mm RLP, HU 940, visible). 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 Had surgery with Dr. Pollyann Huber Aug 2021. Right renal pelvic stone tx URS 09/21 (couldn't access LP stone) and Right lower pole stone tx with ESWL June 2022. Stone was Ca Ox.   4) h/o bladder ca - Pt with h/o of possible Ta bladder ca (dx in [place]) and f/u bladder diverticulectomy and pelvic lymphadenectomy at [facility] which was benign years ago. 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 Cysto 06/22 with typical erythema and inflammation of bladder throughout but no specific  mucosal lesions or mass. Apr 2023 cysto NEd. Tumor near tight UO on cysto today - Apr 2024.   F/u bx:  -June 2017 -- bbx, fulguration - low grade dysplasia right superior (no CIS), Huber biopsies benign/inflammation.  -April 2019 -- multiple biopsies of the bladder and diverticula revealed inflammation and dysplasia (preneoplastic)  -Sep 2021 (during right URS/HLL) - random tic and bladder biopsies benign.   Last BCG: Jan 2020 #3, Jan 2019; Jun 2018; Apr 2017 completed BCG induction 6/6 - delayed due to incomplete bladder emptying/UTI   Staging:  May 2020 - CT - inflammation, bladder thickening a little better. Benign  Nov 2023 - CT benign, left BWT   Cytology:  May 2020 cytology negative, + for inflammation, wbcs.   5) ED - 2016 - He does have difficulties achieving an erection. He does have problems maintaining his erections. He has tried Viagra. It did work. He needs a refill of sildenafil. He takes 60 - 80 at a time. Working well.   Today, "Justin Huber" is seen for the above. His Apr 2024 PSA 2.4. cr 0.72. Doing well. No dysuria. No incontinence. No nocturia. Proteus cx tx with bactrim (rash) Dec 2023. Cysto - right bladder tumor over/near right UO.     ALLERGIES: Sulfa - Skin Rash, Itching    MEDICATIONS: Hydrochlorothiazide 25 mg tablet  Allegra Allergy 180 mg tablet  B Complex  Flonase Allergy Relief 50  mcg/actuation spray, suspension  Irbesartan 300 mg tablet  Pravastatin Sodium  Tylenol Arthritis  Vitamin D3 50 mcg (2,000 unit) tablet Oral     GU PSH: Bladder Instill AntiCA Agent - 2020, 2020, 2019, 2018, 2018, 2018, 2018, 2018, 2017, 2017, 2017 Cysto Bladder Stone <2.5cm - 2015 Cysto Fulgurate < 0.5 cm - 2021 Cysto Uretero Lithotripsy - 2015 Cystoscopy - 07/20/2021, 09/15/2020, 2021, 2020, 2020, 2020, 2019, 2019, 2018, 2018, 2018, 2017, 2012 Cystoscopy Insert Stent - 2015, 2015, 2015 Cystoscopy TURBT <2 cm - 2017 Cystoscopy TURBT 2-5 cm - 2019, 2016 Cystoscopy  Ureteroscopy - 2015, 2008 ESWL - 2014 Locm 300-399Mg /Ml Iodine,1Ml - 02/14/2022, 2020, 2018, 2017 Ureteroscopic laser litho, Right - 2021       PSH Notes: Cystoscopy With Fulguration Medium Lesion (2-5cm), Cystoscopy With Ureteroscopy With Lithotripsy, Cystoscopy With Insertion Of Ureteral Stent Right, Cystoscopy With Ureteroscopy Right, Cystoscopy With Insertion Of Ureteral Stent Right, Cystoscopy With Insertion Of Ureteral Stent Right, Cystoscopy With Fragmentation Of Bladder Calculus, Lithotripsy, Cystoscopy (Diagnostic), Cystoscopy Bladder Tumor, Neck Surgery, Rhinoplasty, Cystoscopy With Ureteroscopy, Repair Of Forearm   NON-GU PSH: Reconstruct Nose - 2008     GU PMH: History of bladder cancer - 02/14/2022, - 01/19/2022, With the new HD digital scope, this is one of the best looks out that it is bladder and recent memory. Looks good without any obvious disease., - 07/20/2021, cystoscopy in 4 mo , - 03/14/2021, Cysto looks good today. Cytology and urine cx sent out of precaution. , - 09/15/2020 (Stable), bx benign . He wants cysto in 6 mo , - 01/28/2020 (Stable), - 2020, - 2020 BPH w/LUTS - 01/19/2022, - 03/14/2021, - 01/28/2020, - 2021, - 2020, - 2018, - 2017, - 2017, Benign localized prostatic hyperplasia with lower urinary tract symptoms (LUTS), - 2016 Incomplete bladder emptying - 01/19/2022, I will see him back in 6 months with a postvoid and a PSA check (Justin Huber may check again ) and DRE., - 07/20/2021, - 03/14/2021 (Stable), - 01/28/2020, - 2020 Renal calculus, check a surveillance scan - 01/19/2022, KUB clear today , - 03/14/2021, - 10/07/2020 (Stable), Discussed the RLP stone - it is 10 mm. Difficult to access with URS. Discussed nature rba to ESWL and he will proceed. Favorable SSD and HU as well. , - 09/15/2020, kub in 6 mo , - 01/28/2020, Discussed the stone and nature r/b of URS or PCNL. He's got back surgery and parathyroid surgery coming up - both if which were delayed by the  pandemic. Check KUB in 6 mo. , - 2021, - 2020, Bilateral kidney stones, - 2016 Elevated PSA - 07/20/2021, PSA normalized , - 03/14/2021, We discussed the nature, risks and benefits of PSA screening as well as the nature of elevated PSA (benign versus malignant). We discussed the possibility of prostate cancer and that as the PSA rises above the level of 2.5 and even over 1, the risk of prostate cancer on biopsy increases. We discussed the management of prostate cancer might include active surveillance or treatment depending on patient and cancer characteristics. In that context, we discussed the nature, risks and benefits of continued surveillance, Huber lab tests, transrectal ultrasound/prostate biopsy, or prostate MRI. All questions answered. , - 09/15/2020, - 2018, - 2017, - 2017 ED due to arterial insufficiency, sildenafil refilled - 09/15/2020, sildenafil refilled , - 01/28/2020, - 2020, - 2019, - 2019, - 2019, - 2017, Erectile dysfunction due to arterial insufficiency, - 2016 Bladder Cancer Lateral - 2019, - 2019, - 2019, Malignant neoplasm  of lateral wall of urinary bladder, - 2017 Bladder Cancer Posterior - 2019, - 2019, - 2019, - 2018, - 2018, - 2018, - 2018, - 2017, - 2017 Urinary Urgency - 2018 Left uncertain neoplasm of kidney - 2018 Gross hematuria - 2017, Gross hematuria, - 2017 Chronic cystitis (w/o hematuria) (Worsening) - 2017, Chronic cystitis, - 2017 CIS of the bladder, Carcinoma in situ of bladder - 2017 Urinary Retention, Huber retention of urine - 2017 Dysuria, Dysuria - 2017 Urinary Tract Inf, Unspec site, Pyuria - 2017 Acute Cystitis/UTI, Acute cystitis without hematuria - 2017 History of urolithiasis, Nephrolithiasis - 2014      PMH Notes:  2006-07-04 11:38:20 - Note: Cancer   NON-GU PMH: Bacteriuria, Bacteriuria, asymptomatic - 2017 Encounter for general adult medical examination without abnormal findings, Encounter for preventive health examination - 2017 Abnormal  findings on diagnostic imaging of Huber specified body structures, Abnormal finding on chest xray - 2015 Huber specified noninfective disorders of lymphatic vessels and lymph nodes, 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 - 2015 Personal history of Huber diseases of the circulatory system, History of hypertension - 2014 Personal history of Huber endocrine, nutritional and metabolic disease, History of hypercholesterolemia - 2014 Disorder of parathyroid gland, unspecified    FAMILY HISTORY: Family Health Status - Mother's Age - Runs In Family Family Health Status Number - Runs In Family nephrolithiasis - Runs In Family No pertinent family history - Huber   SOCIAL HISTORY: Marital Status: Married Preferred Language: English; Ethnicity: Not Hispanic Or Latino; Race: White Current Smoking Status: Patient does not smoke anymore.  Social Drinker.  Drinks 1 caffeinated drink per day.     Notes: Former smoker, Alcohol Use, Caffeine Use, Occupation:, Tobacco Use, Marital History - Currently Married, Death In The Family Father   REVIEW OF SYSTEMS:    GU Review Male:   Patient denies frequent urination, hard to postpone urination, burning/ pain with urination, get up at night to urinate, leakage of urine, stream starts and stops, trouble starting your stream, have to strain to urinate , erection problems, and penile pain.  Gastrointestinal (Upper):   Patient denies nausea, vomiting, and indigestion/ heartburn.  Gastrointestinal (Lower):   Patient denies diarrhea and constipation.  Constitutional:   Patient denies fever, night sweats, weight loss, and fatigue.  Skin:   Patient denies skin rash/ lesion and itching.  Eyes:   Patient denies blurred vision and double vision.  Ears/ Nose/ Throat:   Patient denies sore throat and sinus problems.  Hematologic/Lymphatic:   Patient denies swollen glands and easy bruising.  Cardiovascular:   Patient denies leg swelling and chest pains.  Respiratory:   Patient  denies cough and shortness of breath.  Endocrine:   Patient denies excessive thirst.  Musculoskeletal:   Patient denies back pain and joint pain.  Neurological:   Patient denies headaches and dizziness.  Psychologic:   Patient denies depression and anxiety.   VITAL SIGNS: None   GU PHYSICAL EXAMINATION:    Scrotum: No lesions. No edema. No cysts. No warts.  Urethral Meatus: Normal size. No lesion, no wart, no discharge, no polyp. Normal location.  Penis: Circumcised, no warts, no cracks. No dorsal Peyronie's plaques, no left corporal Peyronie's plaques, no right corporal Peyronie's plaques, no scarring, no warts. No balanitis, no meatal stenosis.   MULTI-SYSTEM PHYSICAL EXAMINATION:    Constitutional: Well-nourished. No physical deformities. Normally developed. Good grooming.  Neck: Neck symmetrical, not swollen. Normal tracheal position.  Respiratory: No labored breathing, no use of accessory muscles.  Cardiovascular: Normal temperature, normal extremity pulses, no swelling, no varicosities.  Skin: No paleness, no jaundice, no cyanosis. No lesion, no ulcer, no rash.  Neurologic / Psychiatric: Oriented to time, oriented to place, oriented to person. No depression, no anxiety, no agitation.  Gastrointestinal: No mass, no tenderness, no rigidity, non obese abdomen.     Complexity of Data:  Lab Test Review:   PSA, BUN/Creatinine  Records Review:   Previous Doctor Records  Urine Test Review:   Urine Culture   03/08/21 09/09/20 08/06/18 03/14/17 02/11/15  PSA  Total PSA 2.52 ng/mL 4.14 ng/mL 2.77 ng/mL 2.09 ng/mL 1.25   Free PSA  0.93 ng/mL     % Free PSA  22 % PSA       PROCEDURES:         Flexible Cystoscopy - 52000  Risks, benefits, and some of the potential complications of the procedure were discussed with the patient. All questions were answered. Informed consent was obtained. Antibiotic prophylaxis was given -- Cephalexin. Sterile technique and intraurethral analgesia were  used.  Meatus:  Normal size. Normal location. Normal condition.  Urethra:  No strictures.  External Sphincter:  Normal.  Verumontanum:  Normal.  Prostate:  Non-obstructing. No hyperplasia.  Bladder Neck:  Non-obstructing.  Ureteral Orifices:  Normal location. Normal size. Normal shape. Effluxed clear urine.  Bladder:  Severe trabeculation. right bladder tumor over/near right UO       The lower urinary tract was carefully examined. The procedure was well-tolerated and without complications. Antibiotic instructions were given. Instructions were given to call the office immediately for bloody urine, difficulty urinating, painful urination, fever, chills, nausea, vomiting or Huber illness. The patient stated that he understood these instructions and would comply with them.         Urinalysis Dipstick Dipstick Cont'd  Color: Yellow Bilirubin: Neg mg/dL  Appearance: Clear Ketones: Neg mg/dL  Specific Gravity: 1.610 Blood: Neg ery/uL  pH: 6.0 Protein: Neg mg/dL  Glucose: Neg mg/dL Urobilinogen: 0.2 mg/dL    Nitrites: Neg    Leukocyte Esterase: Neg leu/uL    ASSESSMENT:      ICD-10 Details  1 GU:   Bladder Cancer Lateral - C67.2 Chronic, Stable - Disc and showed pt cysto findings. Disc nature r/b/a to cysto, right TURBT poss stent and gemcitabine PACU. He elects to proceed.    PLAN:           Schedule Return Visit/Planned Activity: Next Available Appointment - Schedule Surgery          Document Letter(s):  Created for Patient: Clinical Summary         Next Appointment:      Next Appointment: 08/24/2022 07:30 AM    Appointment Type: Surgery     Location: [facility] [phone]    Provider: Jerilee Huber, M.D.    Reason for Visit: OP NE TURBT POSS RT STENT GEM      * Signed by Justin Huber, M.D. on 07/26/22 at 10:59 AM (EDT)*      The information contained in this medical record document is considered private and confidential patient information.  This information can only be used for the medical diagnosis and/or medical services that are being provided by the patient's selected caregivers. This information can only be distributed outside of the patient's care if the patient agrees and signs waivers of authorization for this information to be sent to an outside source or route.

## 2022-08-23 NOTE — Anesthesia Preprocedure Evaluation (Signed)
Anesthesia Evaluation  Patient identified by MRN, date of birth, ID band Patient awake    Reviewed: Allergy & Precautions, NPO status , Patient's Chart, lab work & pertinent test results  History of Anesthesia Complications Negative for: history of anesthetic complications  Airway Mallampati: III  TM Distance: >3 FB Neck ROM: Full   Comment: Previous grade I view with MAC 4, easy mask Dental  (+) Dental Advisory Given   Pulmonary neg shortness of breath, neg sleep apnea, neg COPD, Recent URI , Resolved, former smoker   Pulmonary exam normal breath sounds clear to auscultation       Cardiovascular hypertension (HCTZ, irbesartan), Pt. 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 72   Anesthesia Other Findings   Reproductive/Obstetrics                             Anesthesia Physical Anesthesia Plan  ASA: 3  Anesthesia Plan: General   Post-op Pain Management: Tylenol PO (pre-op)*   Induction: Intravenous  PONV Risk Score and Plan: 2 and Ondansetron, Dexamethasone and Treatment may vary due to age or medical condition  Airway Management Planned: Oral ETT  Additional Equipment:   Intra-op Plan:   Post-operative Plan: Extubation in OR  Informed Consent: I have reviewed the patients History and Physical, chart, labs and discussed the procedure including the risks, benefits and alternatives for the proposed anesthesia with the patient or authorized representative who has  indicated his/her understanding and acceptance.     Dental advisory given  Plan Discussed with: CRNA and Anesthesiologist  Anesthesia Plan Comments: (Risks of general anesthesia discussed including, but not limited to, sore throat, hoarse voice, chipped/damaged teeth, injury to vocal cords, nausea and vomiting, allergic reactions, lung infection, heart attack, stroke, and death. All questions answered. )       Anesthesia Quick Evaluation

## 2022-08-24 ENCOUNTER — Other Ambulatory Visit: Payer: Self-pay

## 2022-08-24 ENCOUNTER — Encounter (HOSPITAL_BASED_OUTPATIENT_CLINIC_OR_DEPARTMENT_OTHER)
Admission: RE | Disposition: A | Discharge status: Home / Self Care | Payer: Self-pay | Source: Home / Self Care | Attending: Urology

## 2022-08-24 ENCOUNTER — Ambulatory Visit (HOSPITAL_BASED_OUTPATIENT_CLINIC_OR_DEPARTMENT_OTHER)
Admission: RE | Admit: 2022-08-24 | Discharge: 2022-08-24 | Disposition: A | Discharge status: Home / Self Care | Payer: Medicare HMO | Attending: Urology | Admitting: Urology

## 2022-08-24 ENCOUNTER — Ambulatory Visit (HOSPITAL_BASED_OUTPATIENT_CLINIC_OR_DEPARTMENT_OTHER): Payer: Medicare HMO | Admitting: Anesthesiology

## 2022-08-24 ENCOUNTER — Encounter (HOSPITAL_BASED_OUTPATIENT_CLINIC_OR_DEPARTMENT_OTHER): Payer: Self-pay | Admitting: Urology

## 2022-08-24 DIAGNOSIS — D494 Neoplasm of unspecified behavior of bladder: Secondary | ICD-10-CM

## 2022-08-24 DIAGNOSIS — K219 Gastro-esophageal reflux disease without esophagitis: Secondary | ICD-10-CM | POA: Diagnosis not present

## 2022-08-24 DIAGNOSIS — D414 Neoplasm of uncertain behavior of bladder: Secondary | ICD-10-CM | POA: Diagnosis present

## 2022-08-24 DIAGNOSIS — Z87442 Personal history of urinary calculi: Secondary | ICD-10-CM | POA: Insufficient documentation

## 2022-08-24 DIAGNOSIS — Z87891 Personal history of nicotine dependence: Secondary | ICD-10-CM | POA: Insufficient documentation

## 2022-08-24 DIAGNOSIS — I10 Essential (primary) hypertension: Secondary | ICD-10-CM

## 2022-08-24 DIAGNOSIS — N401 Enlarged prostate with lower urinary tract symptoms: Secondary | ICD-10-CM | POA: Diagnosis not present

## 2022-08-24 DIAGNOSIS — Z8551 Personal history of malignant neoplasm of bladder: Secondary | ICD-10-CM | POA: Diagnosis not present

## 2022-08-24 DIAGNOSIS — D630 Anemia in neoplastic disease: Secondary | ICD-10-CM | POA: Diagnosis not present

## 2022-08-24 DIAGNOSIS — E785 Hyperlipidemia, unspecified: Secondary | ICD-10-CM | POA: Diagnosis not present

## 2022-08-24 DIAGNOSIS — Z79899 Other long term (current) drug therapy: Secondary | ICD-10-CM | POA: Insufficient documentation

## 2022-08-24 DIAGNOSIS — N3030 Trigonitis without hematuria: Secondary | ICD-10-CM | POA: Insufficient documentation

## 2022-08-24 HISTORY — PX: TRANSURETHRAL RESECTION OF BLADDER TUMOR WITH MITOMYCIN-C: SHX6459

## 2022-08-24 LAB — POCT I-STAT, CHEM 8
BUN: 17 mg/dL (ref 8–23)
Calcium, Ion: 1.25 mmol/L (ref 1.15–1.40)
Chloride: 103 mmol/L (ref 98–111)
Creatinine, Ser: 0.6 mg/dL — ABNORMAL LOW (ref 0.61–1.24)
Glucose, Bld: 120 mg/dL — ABNORMAL HIGH (ref 70–99)
HCT: 42 % (ref 39.0–52.0)
Hemoglobin: 14.3 g/dL (ref 13.0–17.0)
Potassium: 3.1 mmol/L — ABNORMAL LOW (ref 3.5–5.1)
Sodium: 143 mmol/L (ref 135–145)
TCO2: 26 mmol/L (ref 22–32)

## 2022-08-24 SURGERY — TRANSURETHRAL RESECTION OF BLADDER TUMOR WITH MITOMYCIN-C
Anesthesia: General | Site: Bladder

## 2022-08-24 MED ORDER — FENTANYL CITRATE (PF) 100 MCG/2ML IJ SOLN
INTRAMUSCULAR | Status: AC
  Filled 2022-08-24: qty 2

## 2022-08-24 MED ORDER — ACETAMINOPHEN 500 MG PO TABS
1000.0000 mg | ORAL_TABLET | Freq: Once | ORAL | Status: AC
  Administered 2022-08-24: 1000 mg via ORAL

## 2022-08-24 MED ORDER — ONDANSETRON HCL 4 MG/2ML IJ SOLN
INTRAMUSCULAR | Status: AC
  Filled 2022-08-24: qty 2

## 2022-08-24 MED ORDER — OXYCODONE HCL 5 MG PO TABS: 5.0000 mg | ORAL_TABLET | Freq: Once | ORAL | Status: DC | PRN

## 2022-08-24 MED ORDER — CEFTRIAXONE SODIUM 2 G IJ SOLR
INTRAMUSCULAR | Status: AC
  Filled 2022-08-24: qty 20

## 2022-08-24 MED ORDER — SODIUM CHLORIDE 0.9 % IR SOLN
Status: DC | PRN
  Administered 2022-08-24 (×5): 3000 mL

## 2022-08-24 MED ORDER — ONDANSETRON HCL 4 MG/2ML IJ SOLN
INTRAMUSCULAR | Status: DC | PRN
  Administered 2022-08-24: 4 mg via INTRAVENOUS

## 2022-08-24 MED ORDER — ACETAMINOPHEN 500 MG PO TABS
ORAL_TABLET | ORAL | Status: AC
  Filled 2022-08-24: qty 2

## 2022-08-24 MED ORDER — STERILE WATER FOR IRRIGATION IR SOLN
Status: DC | PRN
  Administered 2022-08-24: 500 mL

## 2022-08-24 MED ORDER — ROCURONIUM BROMIDE 100 MG/10ML IV SOLN
INTRAVENOUS | Status: DC | PRN
  Administered 2022-08-24: 10 mg via INTRAVENOUS
  Administered 2022-08-24: 50 mg via INTRAVENOUS

## 2022-08-24 MED ORDER — PROPOFOL 10 MG/ML IV BOLUS
INTRAVENOUS | Status: AC
  Filled 2022-08-24: qty 20

## 2022-08-24 MED ORDER — SODIUM CHLORIDE 0.9 % IV SOLN
INTRAVENOUS | Status: AC
  Filled 2022-08-24: qty 100

## 2022-08-24 MED ORDER — GEMCITABINE CHEMO FOR BLADDER INSTILLATION 2000 MG
2000.0000 mg | Freq: Once | INTRAVENOUS | Status: AC
  Administered 2022-08-24: 2000 mg via INTRAVESICAL
  Filled 2022-08-24: qty 2000

## 2022-08-24 MED ORDER — ROCURONIUM BROMIDE 10 MG/ML (PF) SYRINGE
PREFILLED_SYRINGE | INTRAVENOUS | Status: AC
  Filled 2022-08-24: qty 10

## 2022-08-24 MED ORDER — SUGAMMADEX SODIUM 200 MG/2ML IV SOLN
INTRAVENOUS | Status: DC | PRN
  Administered 2022-08-24: 200 mg via INTRAVENOUS

## 2022-08-24 MED ORDER — SODIUM CHLORIDE 0.9 % IV SOLN: INTRAVENOUS | Status: DC

## 2022-08-24 MED ORDER — FENTANYL CITRATE (PF) 100 MCG/2ML IJ SOLN: 25.0000 ug | INTRAMUSCULAR | Status: DC | PRN

## 2022-08-24 MED ORDER — 0.9 % SODIUM CHLORIDE (POUR BTL) OPTIME
TOPICAL | Status: DC | PRN
  Administered 2022-08-24: 500 mL

## 2022-08-24 MED ORDER — GEMCITABINE CHEMO FOR BLADDER INSTILLATION 2000 MG: 2000.0000 mg | Freq: Once | INTRAVENOUS | Status: DC

## 2022-08-24 MED ORDER — LIDOCAINE 2% (20 MG/ML) 5 ML SYRINGE
INTRAMUSCULAR | Status: DC | PRN
  Administered 2022-08-24: 100 mg via INTRAVENOUS
  Administered 2022-08-24: 30 mg via INTRAVENOUS

## 2022-08-24 MED ORDER — DEXAMETHASONE SODIUM PHOSPHATE 10 MG/ML IJ SOLN
INTRAMUSCULAR | Status: DC | PRN
  Administered 2022-08-24: 10 mg via INTRAVENOUS

## 2022-08-24 MED ORDER — DEXAMETHASONE SODIUM PHOSPHATE 10 MG/ML IJ SOLN
INTRAMUSCULAR | Status: AC
  Filled 2022-08-24: qty 1

## 2022-08-24 MED ORDER — LACTATED RINGERS IV SOLN: INTRAVENOUS | Status: DC

## 2022-08-24 MED ORDER — OXYCODONE HCL 5 MG/5ML PO SOLN: 5.0000 mg | Freq: Once | ORAL | Status: DC | PRN

## 2022-08-24 MED ORDER — FENTANYL CITRATE (PF) 100 MCG/2ML IJ SOLN
INTRAMUSCULAR | Status: DC | PRN
  Administered 2022-08-24 (×3): 50 ug via INTRAVENOUS

## 2022-08-24 MED ORDER — LIDOCAINE HCL (PF) 2 % IJ SOLN
INTRAMUSCULAR | Status: AC
  Filled 2022-08-24: qty 5

## 2022-08-24 MED ORDER — PROPOFOL 10 MG/ML IV BOLUS
INTRAVENOUS | Status: DC | PRN
  Administered 2022-08-24: 30 mg via INTRAVENOUS
  Administered 2022-08-24: 150 mg via INTRAVENOUS

## 2022-08-24 MED ORDER — AMISULPRIDE (ANTIEMETIC) 5 MG/2ML IV SOLN: 10.0000 mg | Freq: Once | INTRAVENOUS | Status: DC | PRN

## 2022-08-24 MED ORDER — SODIUM CHLORIDE 0.9 % IV SOLN
2.0000 g | INTRAVENOUS | Status: AC
  Administered 2022-08-24: 2 g via INTRAVENOUS

## 2022-08-24 SURGICAL SUPPLY — 37 items
BAG DRAIN URO-CYSTO SKYTR STRL (DRAIN) ×2 IMPLANT
BAG DRN RND TRDRP ANRFLXCHMBR (UROLOGICAL SUPPLIES) ×2
BAG DRN UROCATH (DRAIN) ×2
BAG URINE DRAIN 2000ML AR STRL (UROLOGICAL SUPPLIES) ×1 IMPLANT
BAG URINE LEG 500ML (DRAIN) IMPLANT
CATH FOLEY 2WAY SLVR  5CC 18FR (CATHETERS) ×2
CATH FOLEY 2WAY SLVR  5CC 20FR (CATHETERS)
CATH FOLEY 2WAY SLVR  5CC 22FR (CATHETERS)
CATH FOLEY 2WAY SLVR 5CC 18FR (CATHETERS) ×1 IMPLANT
CATH FOLEY 2WAY SLVR 5CC 20FR (CATHETERS) IMPLANT
CATH FOLEY 2WAY SLVR 5CC 22FR (CATHETERS) IMPLANT
CATH URET 5FR 28IN CONE TIP (BALLOONS)
CATH URET 5FR 70CM CONE TIP (BALLOONS) IMPLANT
CATH URETERAL DUAL LUMEN 10F (MISCELLANEOUS) IMPLANT
CATH URETL OPEN 5X70 (CATHETERS) IMPLANT
CLOTH BEACON ORANGE TIMEOUT ST (SAFETY) ×2 IMPLANT
DRSG TELFA 3X8 NADH STRL (GAUZE/BANDAGES/DRESSINGS) IMPLANT
ELECT REM PT RETURN 9FT ADLT (ELECTROSURGICAL) ×2
ELECTRODE REM PT RTRN 9FT ADLT (ELECTROSURGICAL) ×2 IMPLANT
EVACUATOR MICROVAS BLADDER (UROLOGICAL SUPPLIES) IMPLANT
GLOVE BIO SURGEON STRL SZ7.5 (GLOVE) ×3 IMPLANT
GLOVE BIO SURGEON STRL SZ8 (GLOVE) IMPLANT
GOWN STRL REUS W/TWL LRG LVL3 (GOWN DISPOSABLE) ×4 IMPLANT
GUIDEWIRE ANG ZIPWIRE 038X150 (WIRE) IMPLANT
GUIDEWIRE STR DUAL SENSOR (WIRE) ×2 IMPLANT
GUIDEWIRE ZIPWRE .038 STRAIGHT (WIRE) IMPLANT
HOLDER FOLEY CATH W/STRAP (MISCELLANEOUS) ×1 IMPLANT
IV NS IRRIG 3000ML ARTHROMATIC (IV SOLUTION) ×7 IMPLANT
KIT TURNOVER CYSTO (KITS) ×2 IMPLANT
LOOP CUT BIPOLAR 24F LRG (ELECTROSURGICAL) ×1 IMPLANT
MANIFOLD NEPTUNE II (INSTRUMENTS) ×2 IMPLANT
NS IRRIG 500ML POUR BTL (IV SOLUTION) ×2 IMPLANT
PACK CYSTO (CUSTOM PROCEDURE TRAY) ×2 IMPLANT
PLUG CATH AND CAP STRL 200 (CATHETERS) IMPLANT
SLEEVE SCD COMPRESS KNEE MED (STOCKING) ×2 IMPLANT
TUBE CONNECTING 12X1/4 (SUCTIONS) ×2 IMPLANT
TUBING UROLOGY SET (TUBING) ×1 IMPLANT

## 2022-08-24 NOTE — Transfer of Care (Signed)
Immediate Anesthesia Transfer of Care Note  Patient: Justin Huber  Procedure(s) Performed: TRANSURETHRAL RESECTION OF BLADDER TUMOR WITH GEMCITABINE IN PACU (Bladder)  Patient Location: PACU  Anesthesia Type:General  Level of Consciousness: awake, alert , oriented, and patient cooperative  Airway & Oxygen Therapy: Patient Spontanous Breathing  Post-op Assessment: Report given to RN and Post -op Vital signs reviewed and stable  Post vital signs: Reviewed and stable  Last Vitals:  Vitals Value Taken Time  BP 129/74 08/24/22 0855  Temp    Pulse 63 08/24/22 0857  Resp 17 08/24/22 0857  SpO2 92 % 08/24/22 0857  Vitals shown include unvalidated device data.  Last Pain:  Vitals:   08/24/22 0646  TempSrc: Oral  PainSc: 0-No pain      Patients Stated Pain Goal: 5 (08/24/22 0646)  Complications: No notable events documented.

## 2022-08-24 NOTE — Anesthesia Procedure Notes (Signed)
Procedure Name: Intubation Date/Time: 08/24/2022 7:40 AM  Performed by: Bishop Limbo, CRNAPre-anesthesia Checklist: Patient identified, Emergency Drugs available, Suction available and Patient being monitored Patient Re-evaluated:Patient Re-evaluated prior to induction Oxygen Delivery Method: Circle System Utilized Preoxygenation: Pre-oxygenation with 100% oxygen Induction Type: IV induction Ventilation: Mask ventilation without difficulty Laryngoscope Size: Mac and 4 Grade View: Grade I Tube type: Oral Tube size: 7.0 mm Number of attempts: 1 Airway Equipment and Method: Stylet Placement Confirmation: ETT inserted through vocal cords under direct vision, positive ETCO2 and breath sounds checked- equal and bilateral Secured at: 22 cm Tube secured with: Tape Dental Injury: Teeth and Oropharynx as per pre-operative assessment

## 2022-08-24 NOTE — Interval H&P Note (Signed)
History and Physical Interval Note:  08/24/2022 7:31 AM  Justin Huber  has presented today for surgery, with the diagnosis of BLADDER CANCER.  The various methods of treatment have been discussed with the patient and family. After consideration of risks, benefits and other options for treatment, the patient has consented to  Procedure(s) with comments: TRANSURETHRAL RESECTION OF BLADDER TUMOR WITH GEMCITABINE IN PACU (N/A) - 75 MINS POSSIBLE CYSTOSCOPY WITH STENT PLACEMENT (Right) as a surgical intervention.  The patient's history has been reviewed, patient examined, no change in status, stable for surgery.  I have reviewed the patient's chart and labs.  Questions were answered to the patient's satisfaction.     Jerilee Field

## 2022-08-24 NOTE — Op Note (Addendum)
Preoperative diagnosis: Bladder neoplasm Show operative diagnosis: Same  Procedure: TURBT 0.5 to 2 cm, postoperative instillation of gemcitabine in PACU  Surgeon: Mena Goes  Anesthesia: General  Indication for procedure: Justin Huber is a 72 year old male with a history of bladder cancer.  He has had low-grade TA, has had CIS.  He completed 3 years of BCG maintenance January 2020.  On surveillance office cystoscopy looks like he had some early papillary recurrence near the right ureteral orifice.  Findings: On exam under anesthesia the penis was circumcised without mass or lesion.  Glans and meatus appeared normal.  Scrotum appeared normal.  On DRE prostate was about 40 g and smooth without hard area or nodule.  No bladder mass palpable on bimanual.  On cystoscopy there was a moderate bulb stricture of the scope dilated.  Photo taken.  On examination of the bladder there is some early papillary tumor just lateral to the right ureteral orifice.  The right ureteral orifice was not resected or involved.  There was also some early papillary tumor extending from that lesion of the right bladder wall toward the bladder neck and this was all fulgurated and resurfaced.  There early papillary recurrence also posteriorly and these areas were biopsied there was some erythema posteriorly and this was biopsied.  The right diverticulum looked clear.  No mucosal lesions.  Description of procedure:  After consent was obtained patient brought to the operating room.  After adequate anesthesia he was placed in lithotomy position.  He was prepped and draped in the usual sterile fashion.  Timeout was performed to confirm the patient and procedure.  Cystoscope was passed per urethra the bladder carefully inspected with a 30 and 70 degree lens.  I then passed the continuous-flow sheath and it is serially used the cold cup biopsy of the right inferior superior and left bladder wall.  I then swapped that out for the loop for  fulguration.  I then used the loop to resect the tumor just lateral to the right ureteral orifice.  I then used it to fulgurate some early papillary changes that were flat on the bladder neck.  This area was about 2.5 cm total.  Also from the left biopsy site going laterally and noticed some early papillary appearing tumor and this was fulgurated.  Careful inspection of the bladder noted there to be no other abnormal appearing areas and adequate hemostasis at low pressure.  The scope was backed out and a 18 [place] Foley catheter was advanced left to gravity drainage.  Urine was clear.  He was awakened taken recovery room in stable condition.  Instillation of gemcitabine in PACU: 2000 mg of gemcitabine was instilled per urethra and allowed to dwell in the bladder for an hour and then drained.  Complications: None  Blood loss: Minimal  Specimens to pathology: #1 right posterior inferior bladder tumor-early papillary appearing tumor #2 right posterior superior-erythema #3 left posterior-early papillary and erythema #4 right bladder-area beside the right ureteral orifice with similar area carpeting up toward the right bladder neck which was fulgurated  Drains: 18 French Foley catheter  Disposition: Patient stable to PACU I discussed the procedure, postop care and follow-up with Justin Huber.

## 2022-08-24 NOTE — Discharge Instructions (Addendum)
Removal of the Foley: Remove the Foley on Sunday morning, Aug 27, 2022 as instructed      No acetaminophen/Tylenol until after 1:00 pm today if needed.   Post Anesthesia Home Care Instructions  Activity: Get plenty of rest for the remainder of the day. A responsible individual must stay with you for 24 hours following the procedure.  For the next 24 hours, DO NOT: -Drive a car -Advertising copywriter -Drink alcoholic beverages -Take any medication unless instructed by your physician -Make any legal decisions or sign important papers.  Meals: Start with liquid foods such as gelatin or soup. Progress to regular foods as tolerated. Avoid greasy, spicy, heavy foods. If nausea and/or vomiting occur, drink only clear liquids until the nausea and/or vomiting subsides. Call your physician if vomiting continues.  Special Instructions/Symptoms: Your throat may feel dry or sore from the anesthesia or the breathing tube placed in your throat during surgery. If this causes discomfort, gargle with warm salt water. The discomfort should disappear within 24 hours.

## 2022-08-24 NOTE — Anesthesia Postprocedure Evaluation (Signed)
Anesthesia Post Note  Patient: Justin Huber  Procedure(s) Performed: TRANSURETHRAL RESECTION OF BLADDER TUMOR WITH GEMCITABINE IN PACU (Bladder)     Patient location during evaluation: PACU Anesthesia Type: General Level of consciousness: awake Pain management: pain level controlled Vital Signs Assessment: post-procedure vital signs reviewed and stable Respiratory status: spontaneous breathing, nonlabored ventilation and respiratory function stable Cardiovascular status: blood pressure returned to baseline and stable Postop Assessment: no apparent nausea or vomiting Anesthetic complications: no   No notable events documented.  Last Vitals:  Vitals:   08/24/22 0930 08/24/22 0945  BP: 139/74 (!) 140/82  Pulse: 62 63  Resp: 15 19  Temp:    SpO2: 91% 92%    Last Pain:  Vitals:   08/24/22 0900  TempSrc:   PainSc: 0-No pain                 Linton Rump

## 2022-08-25 ENCOUNTER — Encounter (HOSPITAL_BASED_OUTPATIENT_CLINIC_OR_DEPARTMENT_OTHER): Payer: Self-pay | Admitting: Urology

## 2022-08-30 LAB — SURGICAL PATHOLOGY

## 2023-02-12 ENCOUNTER — Emergency Department (HOSPITAL_COMMUNITY): Payer: Medicare HMO

## 2023-02-12 ENCOUNTER — Other Ambulatory Visit: Payer: Self-pay

## 2023-02-12 ENCOUNTER — Encounter (HOSPITAL_COMMUNITY): Payer: Self-pay

## 2023-02-12 ENCOUNTER — Emergency Department (HOSPITAL_COMMUNITY)
Admission: EM | Admit: 2023-02-12 | Discharge: 2023-02-12 | Disposition: A | Discharge status: Home / Self Care | Payer: Medicare HMO | Attending: Emergency Medicine | Admitting: Emergency Medicine

## 2023-02-12 DIAGNOSIS — Z1152 Encounter for screening for COVID-19: Secondary | ICD-10-CM | POA: Diagnosis not present

## 2023-02-12 DIAGNOSIS — N10 Acute tubulo-interstitial nephritis: Secondary | ICD-10-CM | POA: Diagnosis not present

## 2023-02-12 DIAGNOSIS — Z79899 Other long term (current) drug therapy: Secondary | ICD-10-CM | POA: Diagnosis not present

## 2023-02-12 DIAGNOSIS — E876 Hypokalemia: Secondary | ICD-10-CM | POA: Diagnosis not present

## 2023-02-12 DIAGNOSIS — J029 Acute pharyngitis, unspecified: Secondary | ICD-10-CM | POA: Insufficient documentation

## 2023-02-12 DIAGNOSIS — R197 Diarrhea, unspecified: Secondary | ICD-10-CM | POA: Insufficient documentation

## 2023-02-12 DIAGNOSIS — N12 Tubulo-interstitial nephritis, not specified as acute or chronic: Secondary | ICD-10-CM

## 2023-02-12 DIAGNOSIS — Z7982 Long term (current) use of aspirin: Secondary | ICD-10-CM | POA: Diagnosis not present

## 2023-02-12 DIAGNOSIS — I10 Essential (primary) hypertension: Secondary | ICD-10-CM | POA: Diagnosis not present

## 2023-02-12 DIAGNOSIS — R531 Weakness: Secondary | ICD-10-CM | POA: Insufficient documentation

## 2023-02-12 DIAGNOSIS — R799 Abnormal finding of blood chemistry, unspecified: Secondary | ICD-10-CM | POA: Diagnosis present

## 2023-02-12 DIAGNOSIS — B379 Candidiasis, unspecified: Secondary | ICD-10-CM | POA: Insufficient documentation

## 2023-02-12 DIAGNOSIS — Z8551 Personal history of malignant neoplasm of bladder: Secondary | ICD-10-CM | POA: Insufficient documentation

## 2023-02-12 DIAGNOSIS — D72829 Elevated white blood cell count, unspecified: Secondary | ICD-10-CM | POA: Insufficient documentation

## 2023-02-12 DIAGNOSIS — R509 Fever, unspecified: Secondary | ICD-10-CM | POA: Diagnosis not present

## 2023-02-12 DIAGNOSIS — B370 Candidal stomatitis: Secondary | ICD-10-CM

## 2023-02-12 LAB — CBC WITH DIFFERENTIAL/PLATELET
Abs Immature Granulocytes: 0.05 10*3/uL (ref 0.00–0.07)
Basophils Absolute: 0 10*3/uL (ref 0.0–0.1)
Basophils Relative: 0 %
Eosinophils Absolute: 0 10*3/uL (ref 0.0–0.5)
Eosinophils Relative: 0 %
HCT: 46.5 % (ref 39.0–52.0)
Hemoglobin: 16 g/dL (ref 13.0–17.0)
Immature Granulocytes: 0 %
Lymphocytes Relative: 11 %
Lymphs Abs: 1.3 10*3/uL (ref 0.7–4.0)
MCH: 30 pg (ref 26.0–34.0)
MCHC: 34.4 g/dL (ref 30.0–36.0)
MCV: 87.1 fL (ref 80.0–100.0)
Monocytes Absolute: 1.3 10*3/uL — ABNORMAL HIGH (ref 0.1–1.0)
Monocytes Relative: 11 %
Neutro Abs: 8.9 10*3/uL — ABNORMAL HIGH (ref 1.7–7.7)
Neutrophils Relative %: 78 %
Platelets: 173 10*3/uL (ref 150–400)
RBC: 5.34 MIL/uL (ref 4.22–5.81)
RDW: 12.6 % (ref 11.5–15.5)
WBC: 11.6 10*3/uL — ABNORMAL HIGH (ref 4.0–10.5)
nRBC: 0 % (ref 0.0–0.2)

## 2023-02-12 LAB — COMPREHENSIVE METABOLIC PANEL
ALT: 28 U/L (ref 0–44)
AST: 26 U/L (ref 15–41)
Albumin: 4.2 g/dL (ref 3.5–5.0)
Alkaline Phosphatase: 68 U/L (ref 38–126)
Anion gap: 12 (ref 5–15)
BUN: 19 mg/dL (ref 8–23)
CO2: 25 mmol/L (ref 22–32)
Calcium: 9 mg/dL (ref 8.9–10.3)
Chloride: 96 mmol/L — ABNORMAL LOW (ref 98–111)
Creatinine, Ser: 0.95 mg/dL (ref 0.61–1.24)
GFR, Estimated: >60 mL/min (ref >60)
Glucose, Bld: 107 mg/dL — ABNORMAL HIGH (ref 70–99)
Potassium: 2.6 mmol/L — CL (ref 3.5–5.1)
Sodium: 133 mmol/L — ABNORMAL LOW (ref 135–145)
Total Bilirubin: 0.9 mg/dL (ref <1.2)
Total Protein: 7.8 g/dL (ref 6.5–8.1)

## 2023-02-12 LAB — URINALYSIS, ROUTINE W REFLEX MICROSCOPIC
Bilirubin Urine: NEGATIVE
Glucose, UA: NEGATIVE mg/dL
Ketones, ur: 20 mg/dL — AB
Nitrite: NEGATIVE
Protein, ur: 100 mg/dL — AB
Specific Gravity, Urine: 1.018 (ref 1.005–1.030)
WBC, UA: >50 WBC/hpf (ref 0–5)
pH: 5 (ref 5.0–8.0)

## 2023-02-12 LAB — I-STAT CHEM 8, ED
BUN: 18 mg/dL (ref 8–23)
Calcium, Ion: 1.16 mmol/L (ref 1.15–1.40)
Chloride: 95 mmol/L — ABNORMAL LOW (ref 98–111)
Creatinine, Ser: 1 mg/dL (ref 0.61–1.24)
Glucose, Bld: 112 mg/dL — ABNORMAL HIGH (ref 70–99)
HCT: 46 % (ref 39.0–52.0)
Hemoglobin: 15.6 g/dL (ref 13.0–17.0)
Potassium: 2.7 mmol/L — CL (ref 3.5–5.1)
Sodium: 135 mmol/L (ref 135–145)
TCO2: 25 mmol/L (ref 22–32)

## 2023-02-12 LAB — RESP PANEL BY RT-PCR (RSV, FLU A&B, COVID)  RVPGX2
Influenza A by PCR: NEGATIVE
Influenza B by PCR: NEGATIVE
Resp Syncytial Virus by PCR: NEGATIVE
SARS Coronavirus 2 by RT PCR: NEGATIVE

## 2023-02-12 LAB — MAGNESIUM: Magnesium: 1.6 mg/dL — ABNORMAL LOW (ref 1.7–2.4)

## 2023-02-12 LAB — LIPASE, BLOOD: Lipase: 31 U/L (ref 11–51)

## 2023-02-12 MED ORDER — MAGNESIUM OXIDE -MG SUPPLEMENT 400 (240 MG) MG PO TABS
800.0000 mg | ORAL_TABLET | Freq: Once | ORAL | Status: AC
  Administered 2023-02-12: 800 mg via ORAL
  Filled 2023-02-12: qty 2

## 2023-02-12 MED ORDER — IOHEXOL 300 MG/ML  SOLN
100.0000 mL | Freq: Once | INTRAMUSCULAR | Status: AC | PRN
  Administered 2023-02-12: 100 mL via INTRAVENOUS

## 2023-02-12 MED ORDER — POTASSIUM CHLORIDE CRYS ER 20 MEQ PO TBCR
40.0000 meq | EXTENDED_RELEASE_TABLET | ORAL | Status: AC
  Administered 2023-02-12 (×3): 40 meq via ORAL
  Filled 2023-02-12 (×3): qty 2

## 2023-02-12 NOTE — ED Provider Notes (Signed)
Received patient in turnover from Dr. Jearld Fenton.  Please see their note for further details of Hx, PE.  Briefly patient is a 72 y.o. male with a Abnormal Lab .  Patient with low K in the office.  Having frequent stools for some time now. Also found to be febrile, sore throat, thrush.  Plan for CT imaging, labs, reassess.   Patient CT scan with concern for possible pyelonephritis versus renal mass.  With him having a fever and large leukocyte esterase and too numerous to count bacteria would feel more likely infectious.  Patient is little bit apprehensive to start antibiotics.    I discussed coming into the hospital for telemetry monitoring and repeat potassium supplementation.  IV antibiotics for possible pyelonephritis.  Patient is declining and feels better and would like to go home.  He has appointment with his family doctor tomorrow in the afternoon.  I encouraged him to increase his dietary potassium.  Encouraged him to start antibiotics.  Encouraged him to return anytime if symptoms worsened or he changes mind.   Melene Plan, DO 02/12/23 1944

## 2023-02-12 NOTE — ED Provider Triage Note (Signed)
Emergency Medicine Provider Triage Evaluation Note  Justin Huber , a 72 y.o. male  was evaluated in triage.  Pt complains of abnormal lab.  Review of Systems  Positive: Oral thrush, ?low potassium (2.8)  Negative: Fever,   Physical Exam  BP (!) 150/82 (BP Location: Left Arm)   Pulse 86   Temp 98.3 F (36.8 C) (Oral)   Resp 16   Ht 6\' 1"  (1.854 m)   Wt 95.8 kg   SpO2 94%   BMI 27.86 kg/m  Gen:   Awake, no distress   Resp:  Normal effort  MSK:   Moves extremities without difficulty  Other:    Medical Decision Making  Medically screening exam initiated at 12:12 PM.  Appropriate orders placed.  Justin Huber was informed that the remainder of the evaluation will be completed by another provider, this initial triage assessment does not replace that evaluation, and the importance of remaining in the ED until their evaluation is complete.  Was seen by Urgent Care, K+ found to be 2.8, sent to ED. Concerns also for oral thrush and recent antibiotic use (UTI)   Elpidio Anis, PA-C 02/12/23 1216

## 2023-02-12 NOTE — ED Notes (Signed)
Pt stated having a reaction to IV potassium a couple years ago, He said "it felt like acid going in"  Pt felt it was related to IV and not a true allergy,  Advised pt to call out immediately if experiencing any signs of feelings of allergic reaction

## 2023-02-12 NOTE — Discharge Instructions (Signed)
I discussed with you about staying in the hospital.  Please return for worsening fatigue if you pass out or if you feel things have worsened.  At that point likely would consider admission to the hospital.Please try to eat a food that is high in potassium with each meal until you are rechecked in your doctor's office.

## 2023-02-12 NOTE — ED Provider Notes (Signed)
[place] EMERGENCY DEPARTMENT AT [facility] Provider Note   CSN: [id number] Arrival date & time: 02/12/23  1134     History {Add pertinent medical, surgical, social history, OB history to HPI:1} Chief Complaint  Patient presents with   Abnormal Lab    Justin Huber is a 72 y.o. male with PMH as listed below who presents for evaluation for abnormal lab value. States his potassium level was 2.8 and sent over here for further evaluation. Reports went to urgent care due to having some diarrhea, sore throat and weakness. Denies abdominal pain, flank pain, urinary sxs, hematochezia, melena. Pt had a fever of 101 F at urgent care and it was treated at urgent care. Pt reports last time he received potassium infusion his blood pressure dropped and a rapid response was called on him. Urgent care also noted possible thrush, which patient states he has had before. 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   s/p  right inferior parathyroidectomy,  benign adenoma   History of bladder cancer urologist-  dr Mena Goes   s/p  TURBT  and removal bladder diverticulum   History of hypercalcemia    History of kidney stones    History of non-Hodgkin's lymphoma    1980--  RADIATION THERAPY /  NO CHEMO---  NO RECURRENCE   Hyperlipidemia    Hypertension    Incomplete emptying of bladder    Lumbar spinal stenosis    L4 -- L5--  LEG PAIN--- TREATMENT EPI INJECITON'S   Nephrolithiasis    right   Nocturia more than twice per night    Sigmoid diverticulosis    Squamous cell carcinoma of skin 06/02/2020   Left Mid Parietal Scalp       Home Medications Prior to Admission medications   Medication Sig Start Date End Date Taking? Authorizing Provider  Apoaequorin (PREVAGEN PO) Take by mouth daily. Patient not taking: Reported on 08/24/2022    [provider]  aspirin EC 81 MG tablet  Take 81 mg by mouth every evening. Patient not taking: Reported on 08/24/2022    [provider]  b complex vitamins capsule Take 1 capsule by mouth daily. Patient not taking: Reported on 08/24/2022    [provider]  B Complex-Biotin-FA (SUPER QUINTS B-50) TABS Take by mouth.    [provider]  Cholecalciferol (VITAMIN D3) 2000 units TABS Take 2,000 Units by mouth daily.     [provider]  fexofenadine (ALLEGRA) 180 MG tablet Take 180 mg by mouth daily.    [provider]  fluticasone (FLONASE) 50 MCG/ACT nasal spray Place 1 spray into both nostrils daily.    [provider]  fluticasone (FLONASE) 50 MCG/ACT nasal spray Place into the nose.    [provider]  hydrochlorothiazide (HYDRODIURIL) 25 MG tablet Take 25 mg by mouth every morning.     [provider]  irbesartan (AVAPRO) 300 MG tablet Take 300 mg by mouth every morning.     [provider]  ondansetron (ZOFRAN-ODT) 8 MG disintegrating tablet Take 1 tablet (8 mg total) by mouth every 8 (eight) hours as needed for nausea or vomiting (after lithotripsy). 09/23/20   Loletta Parish., MD  pravastatin (PRAVACHOL) 20 MG tablet Take 20 mg by mouth every evening.     [provider]  senna-docusate (SENOKOT-S) 8.6-50 MG tablet 1 tab po  BID while taking pain meds to prevent constipation. 09/23/20   Loletta Parish., MD  sildenafil (REVATIO) 20 MG tablet SMARTSIG:1-5 Tablet(s) By Mouth As Directed PRN 04/06/21   [provider]  tamsulosin (FLOMAX) 0.4 MG CAPS capsule Take by mouth. Patient not taking: Reported on 08/24/2022 11/09/13   [provider]  triamcinolone (NASACORT) 55 MCG/ACT AERO nasal inhaler Place into the nose. 08/03/16   [provider]  Zinc 30 MG TABS Take 1 tablet by mouth daily.    [provider]      Allergies    Sulfa antibiotics    Review of Systems   Review of Systems A 10 point review of  systems was performed and is negative unless otherwise reported in HPI.  Physical Exam Updated Vital Signs BP (!) 150/82 (BP Location: Left Arm)   Pulse 86   Temp 98.3 F (36.8 C) (Oral)   Resp 16   Ht 6\' 1"  (1.854 m)   Wt 95.8 kg   SpO2 94%   BMI 27.86 kg/m  Physical Exam General: Normal appearing male, lying in bed.  HEENT: Sclera anicteric, MMM, trachea midline. Clear orpharynx. Midline uvula.  Cardiology: RRR, no murmurs/rubs/gallops.  Resp: Normal respiratory rate and effort. CTAB, no wheezes, rhonchi, crackles.  Abd: Soft, non-tender, non-distended. No rebound tenderness or guarding.  GU: Deferred. MSK: No peripheral edema or signs of trauma Skin: warm, dry.  Back: No CVA tenderness Neuro: A&Ox4, CNs II-XII grossly intact. MAEs. Sensation grossly intact.  Psych: Normal mood and affect.   ED Results / Procedures / Treatments   Labs (all labs ordered are listed, but only abnormal results are displayed) Labs Reviewed  I-STAT CHEM 8, ED - Abnormal; Notable for the following components:      Result Value   Potassium 2.7 (*)    Chloride 95 (*)    Glucose, Bld 112 (*)    All other components within normal limits    EKG EKG Interpretation Date/Time:  Monday February 12 2023 11:46:27 EST Ventricular Rate:  84 PR Interval:  158 QRS Duration:  99 QT Interval:  377 QTC Calculation: 446 R Axis:   -39  Text Interpretation: Sinus rhythm Ventricular premature complex Left axis deviation Abnormal R-wave progression, early transition Borderline repolarization abnormality U wave present in diffuse leads Confirmed by Vivi Barrack [phone]) on 02/12/2023 2:13:46 PM  Radiology No results found.  Procedures Procedures  {Document cardiac monitor, telemetry assessment procedure when appropriate:1}  Medications Ordered in ED Medications  iohexol (OMNIPAQUE) 300 MG/ML solution 100 mL (100 mLs Intravenous Contrast Given 02/12/23 1554)  potassium chloride SA (KLOR-CON M) CR  tablet 40 mEq (40 mEq Oral Given 02/12/23 1917)  magnesium oxide (MAG-OX) tablet 800 mg (800 mg Oral Given 02/12/23 1715)    ED Course/ Medical Decision Making/ A&P                          Medical Decision Making Amount and/or Complexity of Data Reviewed Labs: ordered. Decision-making details documented in ED Course. Radiology: ordered.  Risk OTC drugs. Prescription drug management.    This patient presents to the ED for concern of ***, this involves an extensive number of treatment options, and is a complaint that carries with it a high risk of complications and morbidity.  I considered the following differential and admission for this acute, potentially life threatening condition.   MDM:    Patient with diarrhea and reported hypokalemia, 2.7 on recheck  here. EKG with U wave but no prolonged QT or QRS. He has no CP/SOB. For diarrhea and hypokalemia w/ fever will get CT abd pelvis to evaluate for intraabdominal infection such as appendicitis, diverticulitis. He also   Clinical Course as of 02/16/23 0904  Mon Feb 12, 2023  1453 WBC(!): 11.6 +leukocytosis with left shift. No neutropenia. [HN]  1539 Potassium(!!): 2.7 Hypokalemia. U waves on EKG. Reportedly got hypotensive with IV potassium infusion, so will replete orally whe CT comes back [HN]    Clinical Course User Index [HN] Loetta Rough, MD    Labs: I Ordered, and personally interpreted labs.  The pertinent results include:  those listed above  Imaging Studies ordered: I ordered imaging studies including CT abd pelvis Pending on signout  Additional history obtained from chart review.   Cardiac Monitoring: The patient was maintained on a cardiac monitor.  I personally viewed and interpreted the cardiac monitored which showed an underlying rhythm of: NSR  Reevaluation: After the interventions noted above, I reevaluated the patient and found that they have :{resolved/improved/worsened:23923::"improved"}  Social  Determinants of Health: Lives independently  Disposition:  Patient is signed out to the oncoming ED physician who is made aware of her history, presentation, exam, workup, and plan.    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   s/p  right inferior parathyroidectomy,  benign adenoma   History of bladder cancer urologist-  dr Mena Goes   s/p  TURBT  and removal bladder diverticulum   History of hypercalcemia    History of kidney stones    History of non-Hodgkin's lymphoma    1980--  RADIATION THERAPY /  NO CHEMO---  NO RECURRENCE   Hyperlipidemia    Hypertension    Incomplete emptying of bladder    Lumbar spinal stenosis    L4 -- L5--  LEG PAIN--- TREATMENT EPI INJECITON'S   Nephrolithiasis    right   Nocturia more than twice per night    Sigmoid diverticulosis    Squamous cell carcinoma of skin 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   I have reviewed the patients home medicines and have made adjustments as needed  Problem List / ED Course: Problem List Items Addressed This Visit   None        {Document critical care time when appropriate:1} {Document review of labs and clinical decision tools ie heart score, Chads2Vasc2 etc:1}  {Document your independent review of radiology images, and any outside records:1} {Document your discussion with family members, caretakers, and with consultants:1} {Document social determinants of health affecting pt's care:1} {Document your decision making why or why not admission, treatments were needed:1}  This note was created using dictation software, which may contain spelling or grammatical errors.

## 2023-02-12 NOTE — ED Triage Notes (Signed)
Patient is here for evaluation for abnormal lab value. States his potassium level was 2.8 and sent over here for further evaluation. Reports went to urgent care due to having some diarrhea, sore throat and weakness. Pt had a fever of 101 at urgent care and it was treated at urgent care. Pt reports last time he received potassium infusion his blood pressure dropped and a rapid response was called on him.

## 2023-02-15 ENCOUNTER — Other Ambulatory Visit: Payer: Self-pay

## 2023-02-15 ENCOUNTER — Encounter (HOSPITAL_COMMUNITY): Payer: Self-pay | Admitting: Emergency Medicine

## 2023-02-15 ENCOUNTER — Emergency Department (HOSPITAL_COMMUNITY)
Admission: EM | Admit: 2023-02-15 | Discharge: 2023-02-15 | Disposition: A | Discharge status: Home / Self Care | Payer: Medicare HMO | Attending: Emergency Medicine | Admitting: Emergency Medicine

## 2023-02-15 DIAGNOSIS — R197 Diarrhea, unspecified: Secondary | ICD-10-CM | POA: Diagnosis present

## 2023-02-15 DIAGNOSIS — Z7982 Long term (current) use of aspirin: Secondary | ICD-10-CM | POA: Diagnosis not present

## 2023-02-15 DIAGNOSIS — N10 Acute tubulo-interstitial nephritis: Secondary | ICD-10-CM | POA: Diagnosis not present

## 2023-02-15 DIAGNOSIS — E876 Hypokalemia: Secondary | ICD-10-CM

## 2023-02-15 DIAGNOSIS — N12 Tubulo-interstitial nephritis, not specified as acute or chronic: Secondary | ICD-10-CM

## 2023-02-15 LAB — COMPREHENSIVE METABOLIC PANEL
ALT: 33 U/L (ref 0–44)
AST: 29 U/L (ref 15–41)
Albumin: 3.8 g/dL (ref 3.5–5.0)
Alkaline Phosphatase: 71 U/L (ref 38–126)
Anion gap: 13 (ref 5–15)
BUN: 17 mg/dL (ref 8–23)
CO2: 21 mmol/L — ABNORMAL LOW (ref 22–32)
Calcium: 9 mg/dL (ref 8.9–10.3)
Chloride: 99 mmol/L (ref 98–111)
Creatinine, Ser: 0.8 mg/dL (ref 0.61–1.24)
GFR, Estimated: >60 mL/min (ref >60)
Glucose, Bld: 129 mg/dL — ABNORMAL HIGH (ref 70–99)
Potassium: 3.3 mmol/L — ABNORMAL LOW (ref 3.5–5.1)
Sodium: 133 mmol/L — ABNORMAL LOW (ref 135–145)
Total Bilirubin: 0.9 mg/dL (ref <1.2)
Total Protein: 7.7 g/dL (ref 6.5–8.1)

## 2023-02-15 LAB — CBC WITH DIFFERENTIAL/PLATELET
Abs Immature Granulocytes: 0.1 10*3/uL — ABNORMAL HIGH (ref 0.00–0.07)
Basophils Absolute: 0.1 10*3/uL (ref 0.0–0.1)
Basophils Relative: 0 %
Eosinophils Absolute: 0 10*3/uL (ref 0.0–0.5)
Eosinophils Relative: 0 %
HCT: 42.9 % (ref 39.0–52.0)
Hemoglobin: 14.9 g/dL (ref 13.0–17.0)
Immature Granulocytes: 1 %
Lymphocytes Relative: 5 %
Lymphs Abs: 0.9 10*3/uL (ref 0.7–4.0)
MCH: 29.9 pg (ref 26.0–34.0)
MCHC: 34.7 g/dL (ref 30.0–36.0)
MCV: 86 fL (ref 80.0–100.0)
Monocytes Absolute: 1.9 10*3/uL — ABNORMAL HIGH (ref 0.1–1.0)
Monocytes Relative: 11 %
Neutro Abs: 14.3 10*3/uL — ABNORMAL HIGH (ref 1.7–7.7)
Neutrophils Relative %: 83 %
Platelets: 213 10*3/uL (ref 150–400)
RBC: 4.99 MIL/uL (ref 4.22–5.81)
RDW: 12.1 % (ref 11.5–15.5)
WBC: 17.2 10*3/uL — ABNORMAL HIGH (ref 4.0–10.5)
nRBC: 0 % (ref 0.0–0.2)

## 2023-02-15 LAB — I-STAT CG4 LACTIC ACID, ED: Lactic Acid, Venous: 0.8 mmol/L (ref 0.5–1.9)

## 2023-02-15 NOTE — ED Provider Notes (Signed)
[place] EMERGENCY DEPARTMENT AT [facility] Provider Note   CSN: [id number] Arrival date & time: 02/15/23  2106     History  Chief Complaint  Patient presents with   UTI    Justin Huber is a 72 y.o. male.  Patient with past medical history significant for bladder tumor, non-Hodgkin's lymphoma presents emergency department concerned about possible hypokalemia.  Patient was seen earlier in the week at urgent care and it emergency department due to to diarrhea.  He was found to have a potassium of 2.8 at the urgent care and was recommended to come to the emergency department for IV potassium repletion.  Patient states he has had adverse reactions to IV potassium in the past and did not want to stay for IV repletion.  He did have a CT scan of the emergency room visit which showed possible pyelonephritis versus malignancy.  Since discharge urine culture at urgent care showed sensitivity to Levaquin.  Patient did not start the Levaquin after talking to his urologist.  Patient with history of "dirty urine" due to bladder tumor.  He was asymptomatic with regards to urinary symptoms at that time.  He has been taking his potassium repletion.  He states that this evening he had an episode of urinary frequency and started his Levaquin at 6 PM.  This was his first dose of Levaquin.  He came to the emergency department because he was concerned that his potassium may not have increased with oral supplementation.  He denies new complaints other than urinary frequency.  He denies dysuria, abdominal pain, nausea, vomiting, flank pain, fever.  HPI     Home Medications Prior to Admission medications   Medication Sig Start Date End Date Taking? Authorizing Provider  Apoaequorin (PREVAGEN PO) Take by mouth daily. Patient not taking: Reported on 08/24/2022    [provider]  aspirin EC 81 MG tablet Take 81 mg by mouth every evening. Patient not taking: Reported on 08/24/2022    [provider]  b complex vitamins capsule Take 1 capsule by mouth daily. Patient not taking: Reported on 08/24/2022    [provider]  B Complex-Biotin-FA (SUPER QUINTS B-50) TABS Take by mouth.    [provider]  Cholecalciferol (VITAMIN D3) 2000 units TABS Take 2,000 Units by mouth daily.     [provider]  fexofenadine (ALLEGRA) 180 MG tablet Take 180 mg by mouth daily.    [provider]  fluticasone (FLONASE) 50 MCG/ACT nasal spray Place 1 spray into both nostrils daily.    [provider]  fluticasone (FLONASE) 50 MCG/ACT nasal spray Place into the nose.    [provider]  hydrochlorothiazide (HYDRODIURIL) 25 MG tablet Take 25 mg by mouth every morning.     [provider]  irbesartan (AVAPRO) 300 MG tablet Take 300 mg by mouth every morning.     [provider]  ondansetron (ZOFRAN-ODT) 8 MG disintegrating tablet Take 1 tablet (8 mg total) by mouth every 8 (eight) hours as needed for nausea or vomiting (after lithotripsy). 09/23/20   Loletta Parish., MD  pravastatin (PRAVACHOL) 20 MG tablet Take 20 mg by mouth every evening.     [provider]  senna-docusate (SENOKOT-S) 8.6-50 MG tablet 1 tab po BID while taking pain meds to prevent constipation. 09/23/20   Loletta Parish., MD  sildenafil (REVATIO) 20 MG tablet SMARTSIG:1-5 Tablet(s) By Mouth As Directed PRN 04/06/21   [provider]  tamsulosin (FLOMAX) 0.4 MG CAPS capsule Take by mouth. Patient not taking: Reported on 08/24/2022 11/09/13   [provider]  triamcinolone (NASACORT) 55 MCG/ACT AERO nasal inhaler Place into the nose. 08/03/16   [provider]  Zinc 30 MG TABS Take 1 tablet by mouth daily.    [provider]      Allergies    Sulfa antibiotics    Review of Systems   Review of Systems  Physical Exam Updated Vital Signs BP 131/77 (BP Location: Left Arm)   Pulse (!) 103   Temp 98.9 F (37.2  C) (Oral)   Resp 16   Ht 6\' 1"  (1.854 m)   Wt 95.3 kg   SpO2 95%   BMI 27.71 kg/m  Physical Exam Vitals and nursing note reviewed.  HENT:     Head: Normocephalic and atraumatic.  Cardiovascular:     Rate and Rhythm: Normal rate.  Pulmonary:     Effort: Pulmonary effort is normal. No respiratory distress.  Abdominal:     Palpations: Abdomen is soft.     Tenderness: There is no abdominal tenderness.  Musculoskeletal:        General: No signs of injury.     Cervical back: Normal range of motion.  Skin:    General: Skin is dry.  Neurological:     Mental Status: He is alert.  Psychiatric:        Speech: Speech normal.        Behavior: Behavior normal.     ED Results / Procedures / Treatments   Labs (all labs ordered are listed, but only abnormal results are displayed) Labs Reviewed  COMPREHENSIVE METABOLIC PANEL - Abnormal; Notable for the following components:      Result Value   Sodium 133 (*)    Potassium 3.3 (*)    CO2 21 (*)    Glucose, Bld 129 (*)    All other components within normal limits  CBC WITH DIFFERENTIAL/PLATELET - Abnormal; Notable for the following components:   WBC 17.2 (*)    Neutro Abs 14.3 (*)    Monocytes Absolute 1.9 (*)    Abs Immature Granulocytes 0.10 (*)    All other components within normal limits  I-STAT CG4 LACTIC ACID, ED    EKG EKG Interpretation Date/Time:  Thursday February 15 2023 21:44:52 EST Ventricular Rate:  94 PR Interval:  154 QRS Duration:  94 QT Interval:  362 QTC Calculation: 453 R Axis:   -41  Text Interpretation: Sinus rhythm Atrial premature complex Left anterior fascicular block Abnormal R-wave progression, late transition Confirmed by Alona Bene [phone]) on 02/15/2023 9:49:33 PM  Radiology No results found.  Procedures Procedures    Medications Ordered in ED Medications - No data to display  ED Course/ Medical Decision Making/ A&P                                 Medical Decision Making  This  patient presents to the ED for concern of urinary frequency, this involves an extensive number of treatment options, and is a complaint that carries with it a high risk of complications and morbidity.  The differential diagnosis includes pyelonephritis, cystitis, others   Co morbidities that complicate the patient evaluation  Bladder tumor   Additional history obtained:   External records from outside source obtained and reviewed including urgent care notes   Lab Tests:  I Ordered, and personally interpreted  labs.  The pertinent results include: WBC 17.2, potassium 3.3, lactic acid 0.8   Test / Admission - Considered:  Patient with potassium of 3.3 which is baseline.  Patient with known bacteria in urine susceptible to Levaquin.  He took his first dose this evening.  His leukocytosis has worsened since his visit 3 days ago but he has taken no antibiotics at home.  He denies nausea, vomiting, flank pain, or other worrisome symptoms that would require admission at this time.  Patient will discharge home to take oral potassium repletion and will continue taking Levaquin.  Patient plans to follow-up with [facility] as needed.  Return precautions provided.         Final Clinical Impression(s) / ED Diagnoses Final diagnoses:  Pyelonephritis  Hypokalemia    Rx / DC Orders ED Discharge Orders     None         Pamala Duffel 02/15/23 2237    Maia Plan, MD 02/22/23 1506

## 2023-02-15 NOTE — Discharge Instructions (Signed)
Please take your previously prescribed antibiotics and potassium as directed.  Follow-up as needed with urology.  If you develop any emergent symptoms return to the emergency department.

## 2023-02-15 NOTE — ED Triage Notes (Signed)
Patient seen earlier this week at ED and UC for potassium of 2.8, thrush, and UTI. Treated with PO potassium due to reaction to IV infusion in the past. Patient prescribed abx for UTI but urologist advised not to take them. Patient spiked fever of 102.1 today and became concerned with progression of UTI. Tylenol and naproxen taken at approx 8pm today. 1 dose taken of antibiotic taken at approx 6pm.

## 2023-02-15 NOTE — ED Notes (Signed)
1 set of blood cultures, 1 blue top, 1 light green, and 1 purple top sent to lab.

## 2023-02-17 LAB — URINE CULTURE: Culture: >=100000 — AB

## 2023-02-18 ENCOUNTER — Telehealth (HOSPITAL_BASED_OUTPATIENT_CLINIC_OR_DEPARTMENT_OTHER): Payer: Self-pay | Admitting: *Deleted

## 2023-02-18 NOTE — Telephone Encounter (Signed)
Post ED Visit - Positive Culture Follow-up  Culture report reviewed by antimicrobial stewardship pharmacist: [facility] []  Enzo Bi, Pharm.D. []  Celedonio Miyamoto, Pharm.D., BCPS AQ-ID []  Garvin Fila, Pharm.D., BCPS []  Georgina Pillion, [address].D., BCPS []  [place], [address].D., BCPS, AAHIVP []  Estella Husk, Pharm.D., BCPS, AAHIVP []  Lysle Pearl, PharmD, BCPS []  Phillips Climes, PharmD, BCPS []  Agapito Games, PharmD, BCPS []  Verlan Friends, PharmD []  Mervyn Gay, PharmD, BCPS []  Vinnie Level, PharmD  [facility] []  Len Childs, PharmD []  Greer Pickerel, PharmD []  Adalberto Cole, PharmD []  Perlie Gold, Rph []  Lonell Face) Jean Rosenthal, PharmD []  Earl Many, PharmD []  Junita Push, PharmD []  Dorna Leitz, PharmD []  Terrilee Files, PharmD [x]  Lynann Beaver, PharmD []  Keturah Barre, PharmD []  Loralee Pacas, PharmD []  Bernadene Person, PharmD   Positive urine culture Pt returned to ED 11/14 and started Levaquin per urology x 7 days. No further treatment needed per Renne Crigler, PA-C Patsey Berthold 02/18/2023, 1:18 PM

## 2023-06-26 ENCOUNTER — Other Ambulatory Visit: Payer: Self-pay | Admitting: Urology

## 2023-07-09 ENCOUNTER — Encounter (HOSPITAL_COMMUNITY): Payer: Self-pay

## 2023-07-09 NOTE — Progress Notes (Signed)
 PCP - Tracey Harries, MD LOV 02-23-23 epic Cardiologist - no  PPM/ICD -  Device Orders -  Rep Notified -   Chest x-ray -  EKG - 02-21-23 epic Stress Test -  ECHO -  Cardiac Cath -   Sleep Study -  CPAP -   Fasting Blood Sugar -  Checks Blood Sugar _____ times a day  Blood Thinner Instructions:N/A Aspirin Instructions:  ERAS Protcol - PRE-SURGERY n/a    COVID vaccine -yes  Activity--Able to climb a flight of stairs with no Cp or SOB Anesthesia review: HTN, non hodgkin's lymphoma 1980  Patient denies shortness of breath, fever, cough and chest pain at PAT appointment   All instructions explained to the patient, with a verbal understanding of the material. Patient agrees to go over the instructions while at home for a better understanding. Patient also instructed to self quarantine after being tested for COVID-19. The opportunity to ask questions was provided.

## 2023-07-09 NOTE — Patient Instructions (Signed)
 SURGICAL WAITING ROOM VISITATION  Patients having surgery or a procedure may have no more than 2 support people in the waiting area - these visitors may rotate.    Children under the age of 110 must have an adult with them who is not the patient.  Due to an increase in RSV and influenza rates and associated hospitalizations, children ages 91 and under may not visit patients in [facility] hospitals.  Visitors with respiratory illnesses are discouraged from visiting and should remain at home.  If the patient needs to stay at the hospital during part of their recovery, the visitor guidelines for inpatient rooms apply. Pre-op nurse will coordinate an appropriate time for 1 support person to accompany patient in pre-op.  This support person may not rotate.    Please refer to the [facility] website for the visitor guidelines for Inpatients (after your surgery is over and you are in a regular room).       Your procedure is scheduled on: 07-24-23    Report to [facility] Main Entrance    Report to admitting at       0845  AM   Call this number if you have problems the morning of surgery [phone]   Do not eat food or drink liquids  :After Midnight.            If you have questions, please contact your surgeon's office.   FOLLOW ANY ADDITIONAL PRE OP INSTRUCTIONS YOU RECEIVED FROM YOUR SURGEON'S OFFICE!!!     Oral Hygiene is also important to reduce your risk of infection.                                    Remember - BRUSH YOUR TEETH THE MORNING OF SURGERY WITH YOUR REGULAR TOOTHPASTE  DENTURES WILL BE REMOVED PRIOR TO SURGERY PLEASE DO NOT APPLY "Poly grip" OR ADHESIVES!!!   Do NOT smoke after Midnight   Stop all vitamins and herbal supplements 7 days before surgery.   Take these medicines the morning of surgery with A SIP OF WATER: Flonase, fexofenadine, tylenol if needed    Bring CPAP mask and tubing day of surgery.                              You may not have  any metal on your body including hair pins, jewelry, and body piercing             Do not wear  lotions, powders, perfumes/cologne, or deodorant               Men may shave face and neck.   Do not bring valuables to the hospital. [facility] IS NOT             RESPONSIBLE   FOR VALUABLES.   Contacts, glasses, dentures or bridgework may not be worn into surgery.   Bring small overnight bag day of surgery.   DO NOT BRING YOUR HOME MEDICATIONS TO THE HOSPITAL. PHARMACY WILL DISPENSE MEDICATIONS LISTED ON YOUR MEDICATION LIST TO YOU DURING YOUR ADMISSION IN THE HOSPITAL!    Patients discharged on the day of surgery will not be allowed to drive home.  Someone NEEDS to stay with you for the first 24 hours after anesthesia.   Special Instructions: Bring a copy of your healthcare power of attorney and living will  documents the day of surgery if you haven't scanned them before.              Please read over the following fact sheets you were given: IF YOU HAVE QUESTIONS ABOUT YOUR PRE-OP INSTRUCTIONS PLEASE CALL [phone]    If you test positive for Covid or have been in contact with anyone that has tested positive in the last 10 days please notify you surgeon.    [facility] - Preparing for Surgery Before surgery, you can play an important role.  Because skin is not sterile, your skin needs to be as free of germs as possible.  You can reduce the number of germs on your skin by washing with CHG (chlorahexidine gluconate) soap before surgery.  CHG is an antiseptic cleaner which kills germs and bonds with the skin to continue killing germs even after washing. Please DO NOT use if you have an allergy to CHG or antibacterial soaps.  If your skin becomes reddened/irritated stop using the CHG and inform your nurse when you arrive at Short Stay. Do not shave (including legs and underarms) for at least 48 hours prior to the first CHG shower.  You may shave your face/neck. Please follow these  instructions carefully:  1.  Shower with CHG Soap the night before surgery and the  morning of Surgery.  2.  If you choose to wash your hair, wash your hair first as usual with your  normal  shampoo.  3.  After you shampoo, rinse your hair and body thoroughly to remove the  shampoo.                           4.  Use CHG as you would any other liquid soap.  You can apply chg directly  to the skin and wash                       Gently with a scrungie or clean washcloth.  5.  Apply the CHG Soap to your body ONLY FROM THE NECK DOWN.   Do not use on face/ open                           Wound or open sores. Avoid contact with eyes, ears mouth and genitals (private parts).                       Wash face,  Genitals (private parts) with your normal soap.             6.  Wash thoroughly, paying special attention to the area where your surgery  will be performed.  7.  Thoroughly rinse your body with warm water from the neck down.  8.  DO NOT shower/wash with your normal soap after using and rinsing off  the CHG Soap.                9.  Pat yourself dry with a clean towel.            10.  Wear clean pajamas.            11.  Place clean sheets on your bed the night of your first shower and do not  sleep with pets. Day of Surgery : Do not apply any lotions/deodorants the morning of surgery.  Please wear clean clothes to the hospital/surgery center.  FAILURE  TO FOLLOW THESE INSTRUCTIONS MAY RESULT IN THE CANCELLATION OF YOUR SURGERY PATIENT SIGNATURE_________________________________  NURSE SIGNATURE__________________________________  ________________________________________________________________________

## 2023-07-11 ENCOUNTER — Encounter (HOSPITAL_COMMUNITY)
Admission: RE | Admit: 2023-07-11 | Discharge: 2023-07-11 | Disposition: A | Discharge status: Home / Self Care | Source: Ambulatory Visit | Attending: Urology | Admitting: Urology

## 2023-07-11 ENCOUNTER — Other Ambulatory Visit: Payer: Self-pay

## 2023-07-11 ENCOUNTER — Encounter (HOSPITAL_COMMUNITY): Payer: Self-pay

## 2023-07-11 VITALS — BP 150/84 | HR 66 | Temp 98.1°F | Resp 16 | Ht 73.0 in | Wt 224.0 lb

## 2023-07-11 DIAGNOSIS — I959 Hypotension, unspecified: Secondary | ICD-10-CM | POA: Insufficient documentation

## 2023-07-11 DIAGNOSIS — Z01812 Encounter for preprocedural laboratory examination: Secondary | ICD-10-CM | POA: Diagnosis present

## 2023-07-11 HISTORY — DX: Unspecified osteoarthritis, unspecified site: M19.90

## 2023-07-11 LAB — BASIC METABOLIC PANEL WITH GFR
Anion gap: 8 (ref 5–15)
BUN: 16 mg/dL (ref 8–23)
CO2: 26 mmol/L (ref 22–32)
Calcium: 9.1 mg/dL (ref 8.9–10.3)
Chloride: 101 mmol/L (ref 98–111)
Creatinine, Ser: 0.66 mg/dL (ref 0.61–1.24)
GFR, Estimated: >60 mL/min (ref >60)
Glucose, Bld: 106 mg/dL — ABNORMAL HIGH (ref 70–99)
Potassium: 3.4 mmol/L — ABNORMAL LOW (ref 3.5–5.1)
Sodium: 135 mmol/L (ref 135–145)

## 2023-07-11 LAB — CBC
HCT: 44.5 % (ref 39.0–52.0)
Hemoglobin: 14.7 g/dL (ref 13.0–17.0)
MCH: 29.6 pg (ref 26.0–34.0)
MCHC: 33 g/dL (ref 30.0–36.0)
MCV: 89.5 fL (ref 80.0–100.0)
Platelets: 217 10*3/uL (ref 150–400)
RBC: 4.97 MIL/uL (ref 4.22–5.81)
RDW: 12.3 % (ref 11.5–15.5)
WBC: 8.1 10*3/uL (ref 4.0–10.5)
nRBC: 0 % (ref 0.0–0.2)

## 2023-07-16 NOTE — Progress Notes (Signed)
 Pt. Called wondering if he could continue his prescription Potassium prior to surgery. I told him yes to continue .  No need to take DOS.

## 2023-07-23 NOTE — Progress Notes (Signed)
 Patient phoned stating that he was diagnosed with a sinus infection on 07-19-23 and was started on Prednisone, Xyzal, nasal spray, benzonatate capsules and cough medicine.  Patient states that he has not run a fever and symptoms are much improved.  Patient was advised that this would be discussed with anesthesia and I would call him back.

## 2023-07-24 ENCOUNTER — Encounter (HOSPITAL_COMMUNITY): Payer: Self-pay | Admitting: Urology

## 2023-07-24 ENCOUNTER — Encounter (HOSPITAL_COMMUNITY)
Admission: RE | Disposition: A | Discharge status: Home / Self Care | Payer: Self-pay | Source: Home / Self Care | Attending: Urology

## 2023-07-24 ENCOUNTER — Ambulatory Visit (HOSPITAL_COMMUNITY): Admitting: Certified Registered"

## 2023-07-24 ENCOUNTER — Ambulatory Visit (HOSPITAL_COMMUNITY)
Admission: RE | Admit: 2023-07-24 | Discharge: 2023-07-24 | Disposition: A | Discharge status: Home / Self Care | Attending: Urology | Admitting: Urology

## 2023-07-24 ENCOUNTER — Other Ambulatory Visit: Payer: Self-pay

## 2023-07-24 ENCOUNTER — Ambulatory Visit (HOSPITAL_BASED_OUTPATIENT_CLINIC_OR_DEPARTMENT_OTHER): Admitting: Certified Registered"

## 2023-07-24 DIAGNOSIS — Z8551 Personal history of malignant neoplasm of bladder: Secondary | ICD-10-CM | POA: Diagnosis not present

## 2023-07-24 DIAGNOSIS — Z87891 Personal history of nicotine dependence: Secondary | ICD-10-CM | POA: Insufficient documentation

## 2023-07-24 DIAGNOSIS — N3289 Other specified disorders of bladder: Secondary | ICD-10-CM | POA: Diagnosis not present

## 2023-07-24 DIAGNOSIS — I10 Essential (primary) hypertension: Secondary | ICD-10-CM | POA: Diagnosis not present

## 2023-07-24 DIAGNOSIS — Z923 Personal history of irradiation: Secondary | ICD-10-CM | POA: Diagnosis not present

## 2023-07-24 DIAGNOSIS — C672 Malignant neoplasm of lateral wall of bladder: Secondary | ICD-10-CM

## 2023-07-24 HISTORY — PX: CYSTOSCOPY WITH BIOPSY: SHX5122

## 2023-07-24 HISTORY — DX: Other seasonal allergic rhinitis: J30.2

## 2023-07-24 SURGERY — CYSTOSCOPY, WITH BIOPSY
Anesthesia: General

## 2023-07-24 MED ORDER — STERILE WATER FOR IRRIGATION IR SOLN
Status: DC | PRN
  Administered 2023-07-24: 6000 mL

## 2023-07-24 MED ORDER — DEXAMETHASONE SODIUM PHOSPHATE 10 MG/ML IJ SOLN
INTRAMUSCULAR | Status: AC
  Filled 2023-07-24: qty 1

## 2023-07-24 MED ORDER — DEXAMETHASONE SODIUM PHOSPHATE 10 MG/ML IJ SOLN
INTRAMUSCULAR | Status: DC | PRN
  Administered 2023-07-24: 10 mg via INTRAVENOUS

## 2023-07-24 MED ORDER — LACTATED RINGERS IV SOLN: INTRAVENOUS | Status: DC

## 2023-07-24 MED ORDER — ORAL CARE MOUTH RINSE: 15.0000 mL | Freq: Once | OROMUCOSAL | Status: AC

## 2023-07-24 MED ORDER — PROPOFOL 10 MG/ML IV BOLUS
INTRAVENOUS | Status: DC | PRN
  Administered 2023-07-24: 160 mg via INTRAVENOUS

## 2023-07-24 MED ORDER — ACETAMINOPHEN 500 MG PO TABS
1000.0000 mg | ORAL_TABLET | Freq: Once | ORAL | Status: DC
  Filled 2023-07-24: qty 2

## 2023-07-24 MED ORDER — CHLORHEXIDINE GLUCONATE 0.12 % MT SOLN
15.0000 mL | Freq: Once | OROMUCOSAL | Status: AC
  Administered 2023-07-24: 15 mL via OROMUCOSAL

## 2023-07-24 MED ORDER — CEFAZOLIN SODIUM-DEXTROSE 2-4 GM/100ML-% IV SOLN
2.0000 g | INTRAVENOUS | Status: AC
  Administered 2023-07-24: 2 g via INTRAVENOUS
  Filled 2023-07-24: qty 100

## 2023-07-24 MED ORDER — OXYCODONE HCL 5 MG/5ML PO SOLN: 5.0000 mg | Freq: Once | ORAL | Status: DC | PRN

## 2023-07-24 MED ORDER — LIDOCAINE HCL (PF) 2 % IJ SOLN
INTRAMUSCULAR | Status: AC
  Filled 2023-07-24: qty 5

## 2023-07-24 MED ORDER — FENTANYL CITRATE (PF) 100 MCG/2ML IJ SOLN
INTRAMUSCULAR | Status: AC
  Filled 2023-07-24: qty 2

## 2023-07-24 MED ORDER — SODIUM CHLORIDE 0.9 % IV SOLN: INTRAVENOUS | Status: DC

## 2023-07-24 MED ORDER — LIDOCAINE HCL (CARDIAC) PF 100 MG/5ML IV SOSY
PREFILLED_SYRINGE | INTRAVENOUS | Status: DC | PRN
  Administered 2023-07-24: 100 mg via INTRAVENOUS

## 2023-07-24 MED ORDER — PROPOFOL 10 MG/ML IV BOLUS
INTRAVENOUS | Status: AC
  Filled 2023-07-24: qty 20

## 2023-07-24 MED ORDER — MIDAZOLAM HCL 2 MG/2ML IJ SOLN
INTRAMUSCULAR | Status: AC
  Filled 2023-07-24: qty 2

## 2023-07-24 MED ORDER — DROPERIDOL 2.5 MG/ML IJ SOLN: 0.6250 mg | Freq: Once | INTRAMUSCULAR | Status: DC | PRN

## 2023-07-24 MED ORDER — ONDANSETRON HCL 4 MG/2ML IJ SOLN
INTRAMUSCULAR | Status: DC | PRN
  Administered 2023-07-24: 4 mg via INTRAVENOUS

## 2023-07-24 MED ORDER — FENTANYL CITRATE (PF) 100 MCG/2ML IJ SOLN
INTRAMUSCULAR | Status: DC | PRN
  Administered 2023-07-24 (×2): 25 ug via INTRAVENOUS
  Administered 2023-07-24: 50 ug via INTRAVENOUS

## 2023-07-24 MED ORDER — OXYCODONE HCL 5 MG PO TABS: 5.0000 mg | ORAL_TABLET | Freq: Once | ORAL | Status: DC | PRN

## 2023-07-24 MED ORDER — ONDANSETRON HCL 4 MG/2ML IJ SOLN
INTRAMUSCULAR | Status: AC
  Filled 2023-07-24: qty 2

## 2023-07-24 MED ORDER — FENTANYL CITRATE PF 50 MCG/ML IJ SOSY: 25.0000 ug | PREFILLED_SYRINGE | INTRAMUSCULAR | Status: DC | PRN

## 2023-07-24 SURGICAL SUPPLY — 13 items
BAG URINE DRAIN 2000ML AR STRL (UROLOGICAL SUPPLIES) IMPLANT
BAG URINE LEG 500ML (DRAIN) IMPLANT
BAG URO CATCHER STRL LF (MISCELLANEOUS) ×1 IMPLANT
CATH FOLEY 2WAY SLVR 5CC 16FR (CATHETERS) IMPLANT
DRAPE FOOT SWITCH (DRAPES) ×1 IMPLANT
GLOVE SURG LX STRL 7.5 STRW (GLOVE) ×1 IMPLANT
GOWN STRL REUS W/ TWL XL LVL3 (GOWN DISPOSABLE) ×1 IMPLANT
KIT TURNOVER KIT A (KITS) IMPLANT
LOOP CUT BIPOLAR 24F LRG (ELECTROSURGICAL) IMPLANT
MANIFOLD NEPTUNE II (INSTRUMENTS) ×1 IMPLANT
PACK CYSTO (CUSTOM PROCEDURE TRAY) ×1 IMPLANT
TUBING CONNECTING 10 (TUBING) ×1 IMPLANT
TUBING UROLOGY SET (TUBING) ×1 IMPLANT

## 2023-07-24 NOTE — Op Note (Signed)
 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  He had some increasing areas of erythema was brought today for cystoscopy under anesthesia and biopsy.  Findings: On exam the penis was circumcised and without mass or lesion.  The glans and meatus appeared normal.  The testicles were descended bilaterally and palpably normal the scrotum was normal.  On DRE the prostate was about 50 g and smooth without hard area or nodule.  On cystoscopy the urethra contained a mild bulge stricture in the proximal bulb, the prostate was moderately obstructing with no median lobe.  The bladder was capacious with moderate trabeculation and a right diverticulum.  There were no obvious tumors initially.  But on filling and emptying the bladder there was some wavy mucosa in the right diverticulum and right posterior.  There was some erythema on the left posterior that was biopsied.  These areas were fulgurated for total area on the right and the left of about 2 cm each.  No specific papillary tumor or erythematous patches.  No stone or foreign body.  The ureteral orifices were in their normal orthotopic position with clear efflux.  Description of procedure: After consent was obtained the patient brought to the operating room.  After adequate anesthesia he is placed lithotomy position and prepped and draped in the usual sterile fashion.  Timeout was performed to confirm the patient and procedure.  The cystoscope was passed per urethra and the bladder inspected with a 30 and 70 degree lens.  I then went back into the 30 degree lens and  biopsied in the right diverticulum and used a Bugbee to fulgurate a couple of centimeters of the floor of the diverticulum.  We then went to the left side of the bladder and biopsied and fulgurated this area for about 2 cm.  We went to the right posterior and biopsied and fulgurated for a total area of about 2 cm.  Hemostasis was excellent low-pressure.  The bladder was filled and the scope removed and a 16 [place] Foley was placed and left to gravity drainage.  Exam under anesthesia was performed.  He was awakened taken to the cover room in stable condition.  Complications: None  Blood loss: Minimal  Specimens to pathology: #1 right bladder biopsy (right diverticulum) #2 left bladder biopsy #3 posterior bladder biopsy (right posterior)  Drains: 16 French Foley catheter  Disposition: Patient stable to PACU

## 2023-07-24 NOTE — Transfer of Care (Signed)
 Immediate Anesthesia Transfer of Care Note  Patient: Justin Huber  Procedure(s) Performed: CYSTOSCOPY WITH BIOPSY AND FULGERATION  Patient Location: PACU  Anesthesia Type:General  Level of Consciousness: awake, alert , and oriented  Airway & Oxygen Therapy: Patient Spontanous Breathing and Patient connected to face mask oxygen  Post-op Assessment: Report given to RN and Post -op Vital signs reviewed and stable  Post vital signs: Reviewed and stable  Last Vitals:  Vitals Value Taken Time  BP    Temp    Pulse    Resp    SpO2      Last Pain:  Vitals:   07/24/23 0935  TempSrc:   PainSc: 0-No pain      Patients Stated Pain Goal: 5 (07/24/23 0935)  Complications: No notable events documented.

## 2023-07-24 NOTE — Discharge Instructions (Signed)
 Removal of the Foley-remove the Foley on Friday morning 07/27/2023.

## 2023-07-24 NOTE — H&P (Signed)
 H&P  Chief Complaint: History of bladder cancer, bladder diverticulum  History of Present Illness: Justin Huber is a 73 year old male with a history of bladder cancer.  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  He underwent recent office surveillance cystoscopy which revealed some posterior and right erythema.  Cytology was atypical and one koilocyte and other squamous cells were noted.  He underwent her surveillance CT scan of the abdomen and pelvis March 2025 which was benign.  There was no renal mass.  No lymphadenopathy or bone lesions.  There was no bladder mass.  He was brought today for cystoscopy with potential bladder biopsy or TURBT.  He has been well without bladder pain, dysuria or gross hematuria.  No fever, cough cold or congestion. He did mow grass this weekend and developed red eyes and sinus congestion which is improving.    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   s/p  right inferior parathyroidectomy,  benign adenoma   History of bladder cancer urologist-  dr Derrick Fling   s/p  TURBT  and removal bladder diverticulum   History of hypercalcemia    History of kidney stones    History of non-Hodgkin's lymphoma    1980--  RADIATION THERAPY /  NO CHEMO---  NO RECURRENCE   Hyperlipidemia    Hypertension    Incomplete emptying of bladder    Lumbar spinal stenosis    L4 -- L5--  LEG PAIN--- TREATMENT EPI INJECITON'S   Nephrolithiasis    right   Nocturia more than twice per night    Seasonal allergies    Sigmoid diverticulosis    Squamous cell carcinoma of skin 06/02/2020   Left Mid Parietal Scalp   Lymphoma 1980 and bladder CA   Past Surgical History:  Procedure Laterality Date   BLADDER DIVERTICULECTOMY  2012   [facility]   CYSTO/  ATTEMPTED RESECTION BLADDER TUMOR  01/13/2011   CYSTO/  RIGHT URETEROSCOPIC LASER LITHOTRIPSY STONE EXTRACTION  07/19/2006   CYSTOSCOPY WITH BIOPSY  N/A 03/09/2015   Procedure: CYSTOSCOPY WITH BLADDER BIOPSY AND FULGRATION;  Surgeon: Christina Coyer, MD;  Location: [facility];  Service: Urology;  Laterality: N/A;   CYSTOSCOPY WITH FULGERATION N/A 07/13/2017   Procedure: Blue light CYSTOSCOPY with cysview WITH FULGERATION/ BLADDER BIOPSY;  Surgeon: Christina Coyer, MD;  Location[facility];  Service: Urology;  Laterality: N/A;   CYSTOSCOPY WITH RETROGRADE PYELOGRAM, URETEROSCOPY AND STENT PLACEMENT Right 11/03/2013   Procedure: CYSTOSCOPY WITH RIGHT RETROGRADE PYELOGRAM, RIGHT URETEROSCOPY AND STENT PLACEMENT, Litolipaxy;  Surgeon: Christina Coyer, MD;  Location: [facility];  Service: Urology;  Laterality: Right;   CYSTOSCOPY WITH URETEROSCOPY AND STENT PLACEMENT Right 12/05/2013   Procedure: RIGHT  URETEROSCOPY RIGHT RETROGRADE AND  RIGHT STENT PLACEMENT;  Surgeon: Christina Coyer, MD;  Location: [facility];  Service: Urology;  Laterality: Right;   CYSTOSCOPY WITH URETEROSCOPY AND STENT PLACEMENT Right 12/30/2013   Procedure: RIGHT  URETEROSCOPY AND STENT REMOVAL AND PLACEMENT;  Surgeon: Christina Coyer, MD;  Location: [facility];  Service: Urology;  Laterality: Right;   CYSTOSCOPY/URETEROSCOPY/HOLMIUM LASER/STENT PLACEMENT Right 12/16/2019   Procedure: CYSTOSCOPY/RETROGRADE/URETEROSCOPY/HOLMIUM LASER/STENT PLACEMENT, BLADDER BX'S;  Surgeon: Christina Coyer, MD;  Location: [facility];  Service: Urology;  Laterality: Right;   EXTRACORPOREAL SHOCK WAVE LITHOTRIPSY  10-24-2012  &  2007   EXTRACORPOREAL SHOCK WAVE LITHOTRIPSY Right 09/23/2020   Procedure: EXTRACORPOREAL  SHOCK WAVE LITHOTRIPSY (ESWL);  Surgeon: Osborn Blaze, MD;  [facility];  Service: Urology;  Laterality: Right;   HOLMIUM LASER APPLICATION N/A 12/30/2013   Procedure: HOLMIUM LASER ;  Surgeon: Christina Coyer, MD;  Location: [facility];  Service: Urology;  Laterality: N/A;    NASAL SEPTUM SURGERY  yrs ago   PARATHYROIDECTOMY N/A 11/12/2019   Procedure: PARATHYROIDECTOMY;  Surgeon: Janita Mellow, MD;  Location: [facility];  Service: ENT;  Laterality: N/A;   TRANSURETHRAL RESECTION OF BLADDER TUMOR  09/ 2011  (in [place])   from bladder diverticulum   TRANSURETHRAL RESECTION OF BLADDER TUMOR N/A 09/21/2015   Procedure: TRANSURETHRAL RESECTION OF BLADDER TUMOR (TURBT) less than 2 cm ;  Surgeon: Christina Coyer, MD;  Location: [facility];  Service: Urology;  Laterality: N/A;   TRANSURETHRAL RESECTION OF BLADDER TUMOR WITH MITOMYCIN -C N/A 08/24/2022   Procedure: TRANSURETHRAL RESECTION OF BLADDER TUMOR WITH GEMCITABINE  IN PACU;  Surgeon: Christina Coyer, MD;  Location: [facility];  Service: Urology;  Laterality: N/A;    Home Medications:  Medications Prior to Admission  Medication Sig Dispense Refill Last Dose/Taking   acetaminophen  (TYLENOL ) 650 MG CR tablet Take 650 mg by mouth every 8 (eight) hours as needed for pain.   07/24/2023 at  5:30 AM   fexofenadine (ALLEGRA) 180 MG tablet Take 180 mg by mouth daily.   07/24/2023 at  6:00 AM   fluticasone (FLONASE) 50 MCG/ACT nasal spray Place 1 spray into both nostrils daily.   07/24/2023 at  6:00 AM   hydrochlorothiazide (HYDRODIURIL) 25 MG tablet Take 25 mg by mouth every morning.    07/23/2023 Evening   irbesartan  (AVAPRO ) 300 MG tablet Take 300 mg by mouth every morning.   07/23/2023 Evening   Multiple Vitamin (MULTIVITAMIN WITH MINERALS) TABS tablet Take 1 tablet by mouth daily.   07/16/2023   Omega-3 Fatty Acids (SUPER OMEGA 3 PO) Take 1 capsule by mouth daily.   07/16/2023   potassium chloride  (KLOR-CON ) 10 MEQ tablet Take 10 mEq by mouth daily.   07/23/2023 Evening   pravastatin (PRAVACHOL) 20 MG tablet Take 20 mg by mouth every evening.   07/23/2023 Bedtime   predniSONE (DELTASONE) 20 MG tablet Take 2 tablets by mouth daily. 40mg  daily   07/24/2023 at  7:00 AM   Zinc 30 MG TABS Take 30 mg by mouth  daily.   07/16/2023   ondansetron  (ZOFRAN -ODT) 8 MG disintegrating tablet Take 1 tablet (8 mg total) by mouth every 8 (eight) hours as needed for nausea or vomiting (after lithotripsy). (Patient not taking: Reported on 07/06/2023) 20 tablet 0 Not Taking   senna-docusate (SENOKOT-S) 8.6-50 MG tablet 1 tab po BID while taking pain meds to prevent constipation. (Patient not taking: Reported on 07/06/2023) 10 tablet 0 Not Taking   sildenafil (REVATIO) 20 MG tablet SMARTSIG:1-5 Tablet(s) By Mouth As Directed PRN      Allergies:  Allergies  Allergen Reactions   Sulfa  Antibiotics Hives and Itching    Family History  Problem Relation Age of Onset   Dementia Father    Social History:  reports that he quit smoking about 29 years ago. His smoking use included cigarettes. He started smoking about 42 years ago. He has a 13 pack-year smoking history. He has never been exposed to tobacco smoke. He has never used smokeless tobacco. He reports current alcohol use. He reports that he does not use drugs.  ROS: A complete review of systems was  performed.  All systems are negative except for pertinent findings as noted. Review of Systems  All other systems reviewed and are negative.    Physical Exam:  Vital signs in last 24 hours: Temp:  [98 F (36.7 C)] 98 F (36.7 C) (04/22 0914) Pulse Rate:  [69] 69 (04/22 0914) Resp:  [15] 15 (04/22 0914) BP: (165)/(76) 165/76 (04/22 0914) SpO2:  [94 %] 94 % (04/22 0914) Weight:  [101.6 kg] 101.6 kg (04/22 0935) General:  Alert and oriented, No acute distress HEENT: Normocephalic, atraumatic Cardiovascular: Regular rate and rhythm Lungs: Regular rate and effort Abdomen: Soft, nontender, nondistended, no abdominal masses Back: No CVA tenderness Extremities: No edema Neurologic: Grossly intact  Laboratory Data:  No results found for this or any previous visit (from the past 24 hours). No results found for this or any previous visit (from the past 240  hours). Creatinine: No results for input(s): "CREATININE" in the last 168 hours.  Impression/Assessment:  History of bladder cancer with bladder erythema and atypical cytology-  Plan:  I discussed with the patient and his wife the nature, potential benefits, risks and alternatives to cystoscopy with possible bladder biopsy or TURBT, including side effects of the proposed treatment, the likelihood of the patient achieving the goals of the procedure, and any potential problems that might occur during the procedure or recuperation.  We discussed he may need a postoperative Foley or rarely a ureteral stent. Also discussed significance of the cytology results.  All questions answered. Patient elects to proceed.   Christina Coyer 07/24/2023

## 2023-07-24 NOTE — Anesthesia Procedure Notes (Signed)
 Procedure Name: LMA Insertion Date/Time: 07/24/2023 12:12 PM  Performed by: Bing Buff, RNPre-anesthesia Checklist: Patient identified, Emergency Drugs available, Suction available and Patient being monitored Patient Re-evaluated:Patient Re-evaluated prior to induction Oxygen Delivery Method: Circle system utilized Preoxygenation: Pre-oxygenation with 100% oxygen Induction Type: IV induction Ventilation: Mask ventilation without difficulty LMA: LMA inserted LMA Size: 5.0 Tube type: Oral Number of attempts: 1 Placement Confirmation: positive ETCO2 and breath sounds checked- equal and bilateral Tube secured with: Tape Dental Injury: Teeth and Oropharynx as per pre-operative assessment

## 2023-07-24 NOTE — Anesthesia Preprocedure Evaluation (Addendum)
 Anesthesia Evaluation  Patient identified by MRN, date of birth, ID band Patient awake    Reviewed: Allergy & Precautions, NPO status , Patient's Chart, lab work & pertinent test results  History of Anesthesia Complications Negative for: history of anesthetic complications  Airway Mallampati: III  TM Distance: >3 FB     Dental no notable dental hx.    Pulmonary neg COPD, former smoker, neg PE   breath sounds clear to auscultation       Cardiovascular hypertension, Pt. on medications (-) CAD, (-) Past MI, (-) Cardiac Stents and (-) CABG  Rhythm:Regular Rate:Normal     Neuro/Psych neg Seizures negative neurological ROS     GI/Hepatic negative GI ROS, Neg liver ROS,,,  Endo/Other  negative endocrine ROSneg diabetes    Renal/GU Renal disease   Bladder ca    Musculoskeletal  (+) Arthritis ,    Abdominal   Peds  Hematology   Anesthesia Other Findings Day of surgery medications reviewed with patient.  Reproductive/Obstetrics                              Anesthesia Physical Anesthesia Plan  ASA: 2  Anesthesia Plan: General   Post-op Pain Management: Tylenol  PO (pre-op)*   Induction: Intravenous  PONV Risk Score and Plan: 2 and Treatment may vary due to age or medical condition, Ondansetron , Dexamethasone  and Midazolam   Airway Management Planned: LMA  Additional Equipment: None  Intra-op Plan:   Post-operative Plan: Extubation in OR  Informed Consent: I have reviewed the patients History and Physical, chart, labs and discussed the procedure including the risks, benefits and alternatives for the proposed anesthesia with the patient or authorized representative who has indicated his/her understanding and acceptance.     Dental advisory given  Plan Discussed with: CRNA  Anesthesia Plan Comments:         Anesthesia Quick Evaluation

## 2023-07-25 ENCOUNTER — Encounter (HOSPITAL_COMMUNITY): Payer: Self-pay | Admitting: Urology

## 2023-07-25 LAB — SURGICAL PATHOLOGY

## 2023-07-25 NOTE — Anesthesia Postprocedure Evaluation (Signed)
 Anesthesia Post Note  Patient: Sanjith B Peregoy  Procedure(s) Performed: CYSTOSCOPY WITH BIOPSY AND FULGERATION     Patient location during evaluation: PACU Anesthesia Type: General Level of consciousness: awake and alert Pain management: pain level controlled Vital Signs Assessment: post-procedure vital signs reviewed and stable Respiratory status: spontaneous breathing, nonlabored ventilation, respiratory function stable and patient connected to nasal cannula oxygen Cardiovascular status: blood pressure returned to baseline and stable Postop Assessment: no apparent nausea or vomiting Anesthetic complications: no   No notable events documented.  Last Vitals:  Vitals:   07/24/23 1345 07/24/23 1400  BP: (!) 194/129 (!) 163/87  Pulse: 71 71  Resp: 14 18  Temp:  36.7 C  SpO2: 93% 92%    Last Pain:  Vitals:   07/24/23 1400  TempSrc:   PainSc: 0-No pain                 Leslye Rast
